# Patient Record
Sex: Female | Born: 1960 | Race: White | Hispanic: No | Smoking: Current every day smoker
Health system: Southern US, Community
[De-identification: ages and names within clinical notes are randomized; demographics above are authoritative.]

## PROBLEM LIST (undated history)

## (undated) DIAGNOSIS — R062 Wheezing: Secondary | ICD-10-CM

## (undated) DIAGNOSIS — R498 Other voice and resonance disorders: Secondary | ICD-10-CM

## (undated) DIAGNOSIS — I341 Nonrheumatic mitral (valve) prolapse: Secondary | ICD-10-CM

## (undated) DIAGNOSIS — R5381 Other malaise: Secondary | ICD-10-CM

## (undated) DIAGNOSIS — K5730 Diverticulosis of large intestine without perforation or abscess without bleeding: Secondary | ICD-10-CM

## (undated) DIAGNOSIS — Z8489 Family history of other specified conditions: Secondary | ICD-10-CM

## (undated) DIAGNOSIS — M549 Dorsalgia, unspecified: Secondary | ICD-10-CM

## (undated) DIAGNOSIS — R52 Pain, unspecified: Secondary | ICD-10-CM

## (undated) DIAGNOSIS — M1990 Unspecified osteoarthritis, unspecified site: Secondary | ICD-10-CM

## (undated) DIAGNOSIS — R0602 Shortness of breath: Secondary | ICD-10-CM

## (undated) DIAGNOSIS — R7302 Impaired glucose tolerance (oral): Secondary | ICD-10-CM

## (undated) DIAGNOSIS — R200 Anesthesia of skin: Secondary | ICD-10-CM

## (undated) DIAGNOSIS — J449 Chronic obstructive pulmonary disease, unspecified: Secondary | ICD-10-CM

## (undated) DIAGNOSIS — T883XXA Malignant hyperthermia due to anesthesia, initial encounter: Secondary | ICD-10-CM

## (undated) DIAGNOSIS — I4949 Other premature depolarization: Secondary | ICD-10-CM

## (undated) DIAGNOSIS — R1032 Left lower quadrant pain: Secondary | ICD-10-CM

## (undated) DIAGNOSIS — J029 Acute pharyngitis, unspecified: Secondary | ICD-10-CM

## (undated) DIAGNOSIS — C801 Malignant (primary) neoplasm, unspecified: Secondary | ICD-10-CM

## (undated) DIAGNOSIS — F329 Major depressive disorder, single episode, unspecified: Secondary | ICD-10-CM

## (undated) DIAGNOSIS — K589 Irritable bowel syndrome without diarrhea: Secondary | ICD-10-CM

## (undated) DIAGNOSIS — E8809 Other disorders of plasma-protein metabolism, not elsewhere classified: Secondary | ICD-10-CM

## (undated) DIAGNOSIS — R05 Cough: Secondary | ICD-10-CM

## (undated) DIAGNOSIS — I10 Essential (primary) hypertension: Secondary | ICD-10-CM

## (undated) DIAGNOSIS — R5383 Other fatigue: Secondary | ICD-10-CM

## (undated) DIAGNOSIS — K861 Other chronic pancreatitis: Principal | ICD-10-CM

## (undated) DIAGNOSIS — Z8719 Personal history of other diseases of the digestive system: Secondary | ICD-10-CM

## (undated) DIAGNOSIS — F411 Generalized anxiety disorder: Secondary | ICD-10-CM

## (undated) HISTORY — DX: Left lower quadrant pain: R10.32

## (undated) HISTORY — DX: Essential (primary) hypertension: I10

## (undated) HISTORY — DX: Shortness of breath: R06.02

## (undated) HISTORY — DX: Other chronic pancreatitis: K86.1

## (undated) HISTORY — PX: COLON RESECTION: SHX5231

## (undated) HISTORY — DX: Acute pharyngitis, unspecified: J02.9

## (undated) HISTORY — DX: Other premature depolarization: I49.49

## (undated) HISTORY — DX: Major depressive disorder, single episode, unspecified: F32.9

## (undated) HISTORY — DX: Impaired glucose tolerance (oral): R73.02

## (undated) HISTORY — DX: Dorsalgia, unspecified: M54.9

## (undated) HISTORY — DX: Other fatigue: R53.83

## (undated) HISTORY — DX: Personal history of other diseases of the digestive system: Z87.19

## (undated) HISTORY — DX: Other malaise: R53.81

## (undated) HISTORY — DX: Other disorders of plasma-protein metabolism, not elsewhere classified: E88.09

## (undated) HISTORY — DX: Irritable bowel syndrome without diarrhea: K58.9

## (undated) HISTORY — PX: COLONOSCOPY: SHX174

## (undated) HISTORY — DX: Diverticulosis of large intestine without perforation or abscess without bleeding: K57.30

## (undated) HISTORY — PX: UPPER GASTROINTESTINAL ENDOSCOPY: SHX188

## (undated) HISTORY — DX: Wheezing: R06.2

## (undated) HISTORY — DX: Other voice and resonance disorders: R49.8

## (undated) HISTORY — DX: Generalized anxiety disorder: F41.1

## (undated) HISTORY — DX: Cough: R05

---
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
## 1997-08-02 ENCOUNTER — Other Ambulatory Visit: Admission: RE | Admit: 1997-08-02 | Discharge: 1997-08-02 | Payer: Self-pay | Admitting: Gynecology

## 1997-08-17 ENCOUNTER — Ambulatory Visit (HOSPITAL_COMMUNITY): Admission: RE | Admit: 1997-08-17 | Discharge: 1997-08-17 | Payer: Self-pay | Admitting: Surgery

## 1997-10-20 ENCOUNTER — Other Ambulatory Visit: Admission: RE | Admit: 1997-10-20 | Discharge: 1997-10-20 | Payer: Self-pay | Admitting: Gynecology

## 1998-12-05 ENCOUNTER — Other Ambulatory Visit: Admission: RE | Admit: 1998-12-05 | Discharge: 1998-12-05 | Payer: Self-pay | Admitting: Gynecology

## 1999-11-17 ENCOUNTER — Ambulatory Visit (HOSPITAL_COMMUNITY): Admission: RE | Admit: 1999-11-17 | Discharge: 1999-11-17 | Payer: Self-pay | Admitting: Gynecology

## 1999-11-17 ENCOUNTER — Encounter: Payer: Self-pay | Admitting: Gynecology

## 1999-12-14 ENCOUNTER — Other Ambulatory Visit: Admission: RE | Admit: 1999-12-14 | Discharge: 1999-12-14 | Payer: Self-pay | Admitting: Gynecology

## 2000-12-24 ENCOUNTER — Other Ambulatory Visit: Admission: RE | Admit: 2000-12-24 | Discharge: 2000-12-24 | Payer: Self-pay | Admitting: Gynecology

## 2001-08-29 ENCOUNTER — Encounter (INDEPENDENT_AMBULATORY_CARE_PROVIDER_SITE_OTHER): Payer: Self-pay | Admitting: Specialist

## 2001-08-29 ENCOUNTER — Ambulatory Visit: Admission: RE | Admit: 2001-08-29 | Discharge: 2001-08-29 | Payer: Self-pay | Admitting: Internal Medicine

## 2002-04-08 ENCOUNTER — Encounter: Admission: RE | Admit: 2002-04-08 | Discharge: 2002-04-08 | Payer: Self-pay | Admitting: Internal Medicine

## 2002-04-08 ENCOUNTER — Encounter: Payer: Self-pay | Admitting: Internal Medicine

## 2002-04-29 ENCOUNTER — Encounter: Payer: Self-pay | Admitting: Internal Medicine

## 2002-06-12 ENCOUNTER — Encounter: Payer: Self-pay | Admitting: Internal Medicine

## 2002-06-12 LAB — HM COLONOSCOPY

## 2002-08-03 ENCOUNTER — Encounter: Payer: Self-pay | Admitting: Internal Medicine

## 2003-02-28 ENCOUNTER — Emergency Department (HOSPITAL_COMMUNITY): Admission: EM | Admit: 2003-02-28 | Discharge: 2003-02-28 | Payer: Self-pay | Admitting: Emergency Medicine

## 2004-01-25 ENCOUNTER — Ambulatory Visit (HOSPITAL_COMMUNITY): Admission: RE | Admit: 2004-01-25 | Discharge: 2004-01-25 | Payer: Self-pay | Admitting: Gynecology

## 2004-01-25 ENCOUNTER — Other Ambulatory Visit: Admission: RE | Admit: 2004-01-25 | Discharge: 2004-01-25 | Payer: Self-pay | Admitting: Gynecology

## 2005-04-06 ENCOUNTER — Ambulatory Visit: Payer: Self-pay | Admitting: Internal Medicine

## 2005-08-15 ENCOUNTER — Ambulatory Visit: Payer: Self-pay | Admitting: Internal Medicine

## 2006-09-17 ENCOUNTER — Ambulatory Visit: Payer: Self-pay | Admitting: Internal Medicine

## 2006-09-18 ENCOUNTER — Ambulatory Visit: Payer: Self-pay | Admitting: Internal Medicine

## 2006-09-21 DIAGNOSIS — I4949 Other premature depolarization: Secondary | ICD-10-CM | POA: Insufficient documentation

## 2006-09-21 DIAGNOSIS — Z8719 Personal history of other diseases of the digestive system: Secondary | ICD-10-CM

## 2006-09-21 DIAGNOSIS — R059 Cough, unspecified: Secondary | ICD-10-CM

## 2006-09-21 DIAGNOSIS — R05 Cough: Secondary | ICD-10-CM

## 2006-09-21 DIAGNOSIS — F411 Generalized anxiety disorder: Secondary | ICD-10-CM | POA: Insufficient documentation

## 2006-09-21 DIAGNOSIS — K589 Irritable bowel syndrome without diarrhea: Secondary | ICD-10-CM

## 2006-09-21 HISTORY — DX: Irritable bowel syndrome, unspecified: K58.9

## 2006-09-21 HISTORY — DX: Other premature depolarization: I49.49

## 2006-09-21 HISTORY — DX: Generalized anxiety disorder: F41.1

## 2006-09-21 HISTORY — DX: Cough, unspecified: R05.9

## 2006-09-21 HISTORY — DX: Personal history of other diseases of the digestive system: Z87.19

## 2007-04-07 LAB — CONVERTED CEMR LAB: Pap Smear: NORMAL

## 2007-06-24 ENCOUNTER — Other Ambulatory Visit: Admission: RE | Admit: 2007-06-24 | Discharge: 2007-06-24 | Payer: Self-pay | Admitting: Gynecology

## 2007-06-24 ENCOUNTER — Ambulatory Visit (HOSPITAL_COMMUNITY): Admission: RE | Admit: 2007-06-24 | Discharge: 2007-06-24 | Payer: Self-pay | Admitting: Gynecology

## 2007-12-22 ENCOUNTER — Telehealth: Payer: Self-pay | Admitting: Internal Medicine

## 2007-12-29 ENCOUNTER — Ambulatory Visit: Payer: Self-pay | Admitting: Internal Medicine

## 2007-12-29 DIAGNOSIS — F3289 Other specified depressive episodes: Secondary | ICD-10-CM

## 2007-12-29 DIAGNOSIS — F329 Major depressive disorder, single episode, unspecified: Secondary | ICD-10-CM | POA: Insufficient documentation

## 2007-12-29 DIAGNOSIS — K5730 Diverticulosis of large intestine without perforation or abscess without bleeding: Secondary | ICD-10-CM | POA: Insufficient documentation

## 2007-12-29 DIAGNOSIS — I10 Essential (primary) hypertension: Secondary | ICD-10-CM

## 2007-12-29 HISTORY — DX: Diverticulosis of large intestine without perforation or abscess without bleeding: K57.30

## 2007-12-29 HISTORY — DX: Other specified depressive episodes: F32.89

## 2007-12-29 HISTORY — DX: Essential (primary) hypertension: I10

## 2007-12-29 HISTORY — DX: Major depressive disorder, single episode, unspecified: F32.9

## 2008-03-02 ENCOUNTER — Ambulatory Visit (HOSPITAL_COMMUNITY): Admission: RE | Admit: 2008-03-02 | Discharge: 2008-03-02 | Payer: Self-pay | Admitting: Gynecology

## 2008-06-02 ENCOUNTER — Ambulatory Visit: Payer: Self-pay | Admitting: Internal Medicine

## 2008-06-02 ENCOUNTER — Encounter: Payer: Self-pay | Admitting: Internal Medicine

## 2008-06-02 DIAGNOSIS — R0602 Shortness of breath: Secondary | ICD-10-CM | POA: Insufficient documentation

## 2008-06-02 HISTORY — DX: Shortness of breath: R06.02

## 2008-06-02 LAB — CONVERTED CEMR LAB
AST: 24 units/L (ref 0–37)
Albumin: 4.6 g/dL (ref 3.5–5.2)
Alkaline Phosphatase: 54 units/L (ref 39–117)
Basophils Relative: 0 % (ref 0.0–3.0)
Calcium: 9.9 mg/dL (ref 8.4–10.5)
GFR calc non Af Amer: 63.06 mL/min (ref >60)
Hemoglobin, Urine: NEGATIVE
Hemoglobin: 14.3 g/dL (ref 12.0–15.0)
Lymphocytes Relative: 29.5 % (ref 12.0–46.0)
Monocytes Relative: 5.8 % (ref 3.0–12.0)
Neutro Abs: 5.6 10*3/uL (ref 1.4–7.7)
RBC: 4.5 M/uL (ref 3.87–5.11)
Sodium: 143 meq/L (ref 135–145)
Specific Gravity, Urine: 1.015 (ref 1.000–1.030)
Total Protein: 7.2 g/dL (ref 6.0–8.3)
Urine Glucose: NEGATIVE mg/dL
Urobilinogen, UA: 0.2 (ref 0.0–1.0)

## 2008-06-03 ENCOUNTER — Encounter: Payer: Self-pay | Admitting: Internal Medicine

## 2008-06-21 ENCOUNTER — Encounter: Payer: Self-pay | Admitting: Internal Medicine

## 2008-06-21 ENCOUNTER — Ambulatory Visit: Payer: Self-pay | Admitting: Internal Medicine

## 2008-06-30 ENCOUNTER — Telehealth (INDEPENDENT_AMBULATORY_CARE_PROVIDER_SITE_OTHER): Payer: Self-pay | Admitting: *Deleted

## 2008-07-14 ENCOUNTER — Ambulatory Visit: Payer: Self-pay | Admitting: Internal Medicine

## 2008-07-14 DIAGNOSIS — J029 Acute pharyngitis, unspecified: Secondary | ICD-10-CM

## 2008-07-14 DIAGNOSIS — R5383 Other fatigue: Secondary | ICD-10-CM

## 2008-07-14 DIAGNOSIS — R5381 Other malaise: Secondary | ICD-10-CM

## 2008-07-14 HISTORY — DX: Acute pharyngitis, unspecified: J02.9

## 2008-07-14 HISTORY — DX: Other fatigue: R53.83

## 2008-07-14 HISTORY — DX: Other malaise: R53.81

## 2008-07-15 ENCOUNTER — Encounter (INDEPENDENT_AMBULATORY_CARE_PROVIDER_SITE_OTHER): Payer: Self-pay | Admitting: *Deleted

## 2008-07-20 ENCOUNTER — Encounter: Payer: Self-pay | Admitting: Internal Medicine

## 2008-08-13 ENCOUNTER — Ambulatory Visit: Payer: Self-pay | Admitting: Internal Medicine

## 2008-08-13 DIAGNOSIS — R498 Other voice and resonance disorders: Secondary | ICD-10-CM

## 2008-08-13 HISTORY — DX: Other voice and resonance disorders: R49.8

## 2008-09-14 ENCOUNTER — Encounter: Payer: Self-pay | Admitting: Internal Medicine

## 2008-11-03 ENCOUNTER — Encounter: Payer: Self-pay | Admitting: Gynecology

## 2008-11-03 ENCOUNTER — Other Ambulatory Visit: Admission: RE | Admit: 2008-11-03 | Discharge: 2008-11-03 | Payer: Self-pay | Admitting: Gynecology

## 2008-11-03 ENCOUNTER — Ambulatory Visit: Payer: Self-pay | Admitting: Gynecology

## 2009-01-11 ENCOUNTER — Telehealth: Payer: Self-pay | Admitting: Internal Medicine

## 2009-01-12 ENCOUNTER — Ambulatory Visit: Payer: Self-pay | Admitting: Internal Medicine

## 2009-01-12 DIAGNOSIS — M549 Dorsalgia, unspecified: Secondary | ICD-10-CM

## 2009-01-12 HISTORY — DX: Dorsalgia, unspecified: M54.9

## 2009-05-18 ENCOUNTER — Ambulatory Visit: Payer: Self-pay | Admitting: Internal Medicine

## 2009-05-18 DIAGNOSIS — R062 Wheezing: Secondary | ICD-10-CM

## 2009-05-18 HISTORY — DX: Wheezing: R06.2

## 2009-07-12 ENCOUNTER — Ambulatory Visit (HOSPITAL_COMMUNITY): Admission: RE | Admit: 2009-07-12 | Discharge: 2009-07-12 | Payer: Self-pay | Admitting: Gynecology

## 2010-02-24 ENCOUNTER — Encounter: Payer: Self-pay | Admitting: Internal Medicine

## 2010-02-24 ENCOUNTER — Ambulatory Visit
Admission: RE | Admit: 2010-02-24 | Discharge: 2010-02-24 | Payer: Self-pay | Source: Home / Self Care | Attending: Internal Medicine | Admitting: Internal Medicine

## 2010-02-24 DIAGNOSIS — R1032 Left lower quadrant pain: Secondary | ICD-10-CM

## 2010-02-24 HISTORY — DX: Left lower quadrant pain: R10.32

## 2010-02-26 LAB — CONVERTED CEMR LAB: GC Probe Amp, Urine: NEGATIVE

## 2010-02-27 ENCOUNTER — Ambulatory Visit: Payer: Self-pay | Admitting: Cardiology

## 2010-02-28 ENCOUNTER — Telehealth: Payer: Self-pay | Admitting: Internal Medicine

## 2010-03-06 ENCOUNTER — Telehealth: Payer: Self-pay | Admitting: Internal Medicine

## 2010-03-07 NOTE — Assessment & Plan Note (Signed)
Summary: cough,cold/cd   Vital Signs:  Patient profile:   50 year old female Height:      65.5 inches Weight:      119.50 pounds BMI:     19.65 O2 Sat:      96 % on Room air Temp:     97 degrees F oral Pulse rate:   88 / minute BP sitting:   100 / 70  (left arm) Cuff size:   regular  Vitals Entered ByZella Ball Ewing (May 18, 2009 2:28 PM)  O2 Flow:  Room air CC: Congestion, cough/RE   Primary Care Provider:  Oliver Barre, MD  CC:  Congestion and cough/RE.  History of Present Illness: no insurance today, trying to come in "early" in her illness, does not have illness very often but still smoking,  and here to c/o 3 days onset mild URI symtpoms with sinus and nasal congestion, fever, mild ST, and then onset yest and today with increasing mild min prod cough assoc with wheezing , mild sob.  Pt denies CP,, wheezing, orthopnea, pnd, worsening LE edema, palps, dizziness or syncope .  Problems Prior to Update: 1)  Wheezing  (ICD-786.07) 2)  Bronchitis-acute  (ICD-466.0) 3)  Back Pain  (ICD-724.5) 4)  Globus ? Dysphagia  (ICD-300.11) 5)  Hoarseness, Chronic  (ICD-784.49) 6)  Fatigue  (ICD-780.79) 7)  Sore Throat  (ICD-462) 8)  Dyspnea  (ICD-786.05) 9)  Depression  (ICD-311) 10)  Diverticulosis, Colon  (ICD-562.10) 11)  Hypertension  (ICD-401.9) 12)  Cough, Chronic  (ICD-786.2) 13)  Hemorrhoids, Hx of  (ICD-V12.79) 14)  Irritable Bowel Syndrome  (ICD-564.1) 15)  Premature Ventricular Contractions  (ICD-427.69) 16)  Anxiety  (ICD-300.00)  Medications Prior to Update: 1)  Propranolol Hcl 10 Mg Tabs (Propranolol Hcl) .Marland Kitchen.. 1 By Mouth Daily 2)  Alprazolam 0.25 Mg Tabs (Alprazolam) .Marland Kitchen.. 1po Once Daily As Needed 3)  Dicyclomine Hcl 10 Mg  Caps (Dicyclomine Hcl) .Marland Kitchen.. 1 By Mouth Qac (May Take 2) As Needed To Prevent Stomach Cramps and Rumbling 4)  Multivitamins  Tabs (Multiple Vitamin) .Marland Kitchen.. 1 Tablet By Mouth Once Daily 5)  Zegerid 40-1100 Mg Caps (Omeprazole-Sodium Bicarbonate) ....  Take 1 Capsule By Mouth Two Times A Day 6)  Prednisone 10 Mg Tabs (Prednisone) .... 4po Qd For 3days, Then 3po Qd For 3days, Then 2po Qd For 3days, Then 1po Qd For 3 Days, Then Stop 7)  Tramadol Hcl 50 Mg Tabs (Tramadol Hcl) .Marland Kitchen.. 1 - 2 By Mouth Four Times Per Day As Needed Pain  Current Medications (verified): 1)  Propranolol Hcl 10 Mg Tabs (Propranolol Hcl) .Marland Kitchen.. 1 By Mouth Daily 2)  Alprazolam 0.25 Mg Tabs (Alprazolam) .Marland Kitchen.. 1po Once Daily As Needed 3)  Dicyclomine Hcl 10 Mg  Caps (Dicyclomine Hcl) .Marland Kitchen.. 1 By Mouth Qac (May Take 2) As Needed To Prevent Stomach Cramps and Rumbling 4)  Multivitamins  Tabs (Multiple Vitamin) .Marland Kitchen.. 1 Tablet By Mouth Once Daily 5)  Zegerid 40-1100 Mg Caps (Omeprazole-Sodium Bicarbonate) .... Take 1 Capsule By Mouth Two Times A Day 6)  Prednisone 10 Mg Tabs (Prednisone) .... 4po Qd For 3days, Then 3po Qd For 3days, Then 2po Qd For 3days, Then 1po Qd For 3 Days, Then Stop 7)  Tramadol Hcl 50 Mg Tabs (Tramadol Hcl) .Marland Kitchen.. 1 - 2 By Mouth Four Times Per Day As Needed Pain 8)  Cephalexin 500 Mg Caps (Cephalexin) .Marland Kitchen.. 1 By Mouth Three Times A Day 9)  Tessalon Perles 100 Mg Caps (Benzonatate) .Marland Kitchen.. 1 -  2 By Mouth Three Times A Day As Needed 10)  Fluconazole 150 Mg Tabs (Fluconazole) .Marland Kitchen.. 1po Q 3 Days As Needed  Allergies (verified): 1)  ! * Anesthesias 2)  ! * Succinylcholine  Past History:  Past Medical History: Last updated: 12/29/2007 Anxiety Mitral Valve Prolapse Hx Symmpatomatic PVCs IBS Hemorrhoids Chronic Cough/chronic bronchitis - ? COPD Hypertension Diverticulosis, colon hx of Tx for H pylori  hx of hemoptysis - neg bronch per Dr Sherene Sires Depression  Past Surgical History: Last updated: 06/02/2008 Fractured Jaw, required wiring-1980  Social History: Last updated: 08/13/2008 Current Smoker 1 PPD Married 2 children college educated independent Scientist, research (medical) Alcohol Use - yes  occasional  Illicit Drug Use - no Patient has been counseled to quit smoking  08/13/08  Risk Factors: Smoking Status: current (06/02/2008) Packs/Day: 1.0 (06/02/2008)  Review of Systems       all otherwise negative per pt -    Physical Exam  General:  alert and underweight appearing.   Head:  normocephalic and atraumatic.   Eyes:  vision grossly intact, pupils equal, and pupils round.   Ears:  bilat tm's mild erythema, sinus nontender Nose:  nasal dischargemucosal pallor and mucosal edema.   Mouth:  pharyngeal erythema and fair dentition.   Neck:  supple and no masses.   Lungs:  normal respiratory effort, R decreased breath sounds, R wheezes, L decreased breath sounds, and L wheezes.   Heart:  normal rate and regular rhythm.   Extremities:  no edema, no erythema    Impression & Recommendations:  Problem # 1:  BRONCHITIS-ACUTE (ICD-466.0)  Her updated medication list for this problem includes:    Cephalexin 500 Mg Caps (Cephalexin) .Marland Kitchen... 1 by mouth three times a day    Tessalon Perles 100 Mg Caps (Benzonatate) .Marland Kitchen... 1 - 2 by mouth three times a day as needed treat as above, f/u any worsening signs or symptoms   Problem # 2:  WHEEZING (ICD-786.07) likely copd related;  tx with pred pack  Problem # 3:  HYPERTENSION (ICD-401.9)  Her updated medication list for this problem includes:    Propranolol Hcl 10 Mg Tabs (Propranolol hcl) .Marland Kitchen... 1 by mouth daily  BP today: 100/70 Prior BP: 116/74 (01/12/2009)  Labs Reviewed: K+: 3.8 (06/02/2008) Creat: : 1.0 (06/02/2008)    stable overall by hx and exam, ok to continue meds/tx as is   Complete Medication List: 1)  Propranolol Hcl 10 Mg Tabs (Propranolol hcl) .Marland Kitchen.. 1 by mouth daily 2)  Alprazolam 0.25 Mg Tabs (Alprazolam) .Marland Kitchen.. 1po once daily as needed 3)  Dicyclomine Hcl 10 Mg Caps (Dicyclomine hcl) .Marland Kitchen.. 1 by mouth qac (may take 2) as needed to prevent stomach cramps and rumbling 4)  Multivitamins Tabs (Multiple vitamin) .Marland Kitchen.. 1 tablet by mouth once daily 5)  Zegerid 40-1100 Mg Caps (Omeprazole-sodium  bicarbonate) .... Take 1 capsule by mouth two times a day 6)  Prednisone 10 Mg Tabs (Prednisone) .... 4po qd for 3days, then 3po qd for 3days, then 2po qd for 3days, then 1po qd for 3 days, then stop 7)  Tramadol Hcl 50 Mg Tabs (Tramadol hcl) .Marland Kitchen.. 1 - 2 by mouth four times per day as needed pain 8)  Cephalexin 500 Mg Caps (Cephalexin) .Marland Kitchen.. 1 by mouth three times a day 9)  Tessalon Perles 100 Mg Caps (Benzonatate) .Marland Kitchen.. 1 - 2 by mouth three times a day as needed 10)  Fluconazole 150 Mg Tabs (Fluconazole) .Marland Kitchen.. 1po q 3 days as needed  Patient Instructions:  1)  Please take all new medications as prescribed 2)  Continue all previous medications as before this visit  3)  Please schedule a follow-up appointment as needed. 4)  please stop smoking Prescriptions: FLUCONAZOLE 150 MG TABS (FLUCONAZOLE) 1po q 3 days as needed  #2 x 1   Entered and Authorized by:   Corwin Levins MD   Signed by:   Corwin Levins MD on 05/18/2009   Method used:   Electronically to        [facility] (retail)       [address]       [place], [place]  [address]       Ph: [phone]       Fax: [phone]   RxID:   [id number] FLUCONAZOLE 150 MG TABS (FLUCONAZOLE) 1po q 3 days as needed  #2 x 1   Entered and Authorized by:   Corwin Levins MD   Signed by:   Corwin Levins MD on 05/18/2009   Method used:   Print then Give to Patient   RxID:   [id number] TESSALON PERLES 100 MG CAPS (BENZONATATE) 1 - 2 by mouth three times a day as needed  #50 x 1   Entered and Authorized by:   Corwin Levins MD   Signed by:   Corwin Levins MD on 05/18/2009   Method used:   Print then Give to Patient   RxID:   [id number] CEPHALEXIN 500 MG CAPS (CEPHALEXIN) 1 by mouth three times a day  #30 x 0   Entered and Authorized by:   Corwin Levins MD   Signed by:   Corwin Levins MD on 05/18/2009   Method used:   Print then Give to Patient   RxID:   [id number] PREDNISONE 10 MG TABS (PREDNISONE) 4po qd  for 3days, then 3po qd for 3days, then 2po qd for 3days, then 1po qd for 3 days, then stop  #30 x 0   Entered and Authorized by:   Corwin Levins MD   Signed by:   Corwin Levins MD on 05/18/2009   Method used:   Print then Give to Patient   RxID:   [id number]

## 2010-03-09 NOTE — Progress Notes (Signed)
Summary: ? about CT  Phone Note Call from Patient Call back at Home Phone [phone]   Caller: Patient Summary of Call: Pt called with concerns regarding recent CT abd. Pt is requesting MD advise on possible diet restrictions and is requesting stronger pain mediciation. Please advise Initial call taken by: Margaret Pyle, [name],  February 28, 2010 1:50 PM  Follow-up for Phone Call        no specific diet changes needed except in the future should avoid nuts and popcorn kernels - anything like this that cant be chewed completely and might get "stuck" in a diverticulosis in the colon  the vicodin 5/500 at 1-2 q 6 is a common tx for this pain; I was hoping she could cont with this if possible   Follow-up by: Corwin Levins MD,  February 28, 2010 2:00 PM  Additional Follow-up for Phone Call Additional follow up Details #1::        Pt advised and will schedule ROV if no improvement in sxs with 1 week.  Additional Follow-up by: Margaret Pyle, [name],  March 01, 2010 10:20 AM

## 2010-03-09 NOTE — Assessment & Plan Note (Signed)
Summary: abd pain/john/cd   Vital Signs:  Patient profile:   50 year old female Height:      65.5 inches (166.37 cm) Weight:      119 pounds (54.09 kg) BMI:     19.57 O2 Sat:      98 % on Room air Temp:     98.6 degrees F (37.00 degrees C) oral Pulse rate:   100 / minute BP sitting:   120 / 76  (left arm) Cuff size:   regular  Vitals Entered By: Orlan Leavens RMA (February 24, 2010 4:31 PM)  O2 Flow:  Room air CC: abdminal pain x's 2 days, Abdominal pain Is Patient Diabetic? No Pain Assessment Patient in pain? yes     Location: abdomen   Primary Care Provider:  Oliver Barre, MD  CC:  abdminal pain x's 2 days and Abdominal pain.  History of Present Illness:  Abdominal Pain      This is a 50 year old woman who presents with Abdominal pain.  The symptoms began 2 days ago.  On a scale of 1 to 10, the intensity is described as a 9.  acute onset, progressive intensity.  The patient reports nausea, constipation, and anorexia, but denies vomiting, diarrhea, melena, hematochezia, and hematemesis.  The location of the pain is left upper quadrant.  The pain is described as constant and sharp.  The patient denies the following symptoms: fever, dysuria, chest pain, dark urine, missed menstrual period, and vaginal bleeding.  The pain is worse with standing, movement, and jarring of the abdomen.  The pain is improved by nothing.  No history of same pain but very concerned about STD due to unprotected sex  exosure.  Current Medications (verified): 1)  Propranolol Hcl 10 Mg Tabs (Propranolol Hcl) .Marland Kitchen.. 1 By Mouth Daily 2)  Alprazolam 0.25 Mg Tabs (Alprazolam) .Marland Kitchen.. 1po Once Daily As Needed 3)  Dicyclomine Hcl 10 Mg  Caps (Dicyclomine Hcl) .Marland Kitchen.. 1 By Mouth Qac (May Take 2) As Needed To Prevent Stomach Cramps and Rumbling 4)  Multivitamins  Tabs (Multiple Vitamin) .Marland Kitchen.. 1 Tablet By Mouth Once Daily 5)  Zegerid 40-1100 Mg Caps (Omeprazole-Sodium Bicarbonate) .... Take 1 Capsule By Mouth Two Times A  Day 6)  Tramadol Hcl 50 Mg Tabs (Tramadol Hcl) .Marland Kitchen.. 1 - 2 By Mouth Four Times Per Day As Needed Pain 7)  Fluconazole 150 Mg Tabs (Fluconazole) .Marland Kitchen.. 1po Q 3 Days As Needed  Allergies (verified): 1)  ! * Anesthesias 2)  ! * Succinylcholine  Past History:  Past Medical History: Anxiety Mitral Valve Prolapse Hx Symmpatomatic PVCs IBS Hemorrhoids Chronic Cough/chronic bronchitis - ? COPD Hypertension Diverticulosis, colon  hx of Tx for H pylori  hx of hemoptysis - neg bronch per Dr Sherene Sires Depression  Review of Systems  The patient denies chest pain, syncope, headaches, severe indigestion/heartburn, hematuria, incontinence, genital sores, and enlarged lymph nodes.    Physical Exam  General:  alert and underweight appearing.  very uncomfortable due to abd pain, folded forward while seated holding lower abdomen. Eyes:  vision grossly intact; pupils equal, round and reactive to light.  conjunctiva and lids normal.    Lungs:  normal respiratory effort, no intercostal retractions or use of accessory muscles; normal breath sounds bilaterally - no crackles and no wheezes.    Heart:  normal rate, regular rhythm, no murmur, and no rub. BLE without edema. Abdomen:  tender over LLQ, but soft, normal bowel sounds, no distention; no masses and no appreciable  hepatomegaly or splenomegaly.  no R/G Genitalia:  defer Skin:  .sking    Impression & Recommendations:  Problem # 1:  ABDOMINAL PAIN, LEFT LOWER QUADRANT (ICD-789.04)  suspect diverticulitis - noted hx diverticulosis arrange CT scan to eval anatomy and start emperic abx - cipro + flagyl - symptoms tx as well for pain and nausea - erx done encouraged ED eval - pt declines but agress to consider if symptoms worse rather than better of if unable to tol oral med/fluids also STD screen at pt request Orders: T-Chlamydia  Probe, urine ([id number]) T-GC Probe, urine ([id number]) Misc. Referral (Misc. Ref) Prescription Created  Electronically [phone])  Discussed symptom control with the patient.   Complete Medication List: 1)  Propranolol Hcl 10 Mg Tabs (Propranolol hcl) .Marland Kitchen.. 1 by mouth daily 2)  Alprazolam 0.25 Mg Tabs (Alprazolam) .Marland Kitchen.. 1po once daily as needed 3)  Dicyclomine Hcl 10 Mg Caps (Dicyclomine hcl) .Marland Kitchen.. 1 by mouth qac (may take 2) as needed to prevent stomach cramps and rumbling 4)  Multivitamins Tabs (Multiple vitamin) .Marland Kitchen.. 1 tablet by mouth once daily 5)  Zegerid 40-1100 Mg Caps (Omeprazole-sodium bicarbonate) .... Take 1 capsule by mouth two times a day 6)  Tramadol Hcl 50 Mg Tabs (Tramadol hcl) .Marland Kitchen.. 1 - 2 by mouth four times per day as needed pain 7)  Fluconazole 150 Mg Tabs (Fluconazole) .Marland Kitchen.. 1po q 3 days as needed 8)  Cipro 500 Mg Tabs (Ciprofloxacin hcl) .Marland Kitchen.. 1 by mouth two times a day x 7 days 9)  Metronidazole 500 Mg Tabs (Metronidazole) .Marland Kitchen.. 1 by mouth three times a day x 7 days 10)  Promethazine Hcl 25 Mg Tabs (Promethazine hcl) .Marland Kitchen.. 1 by mouth every 4 hours as needed for nausea symptoms 11)  Vicodin 5-500 Mg Tabs (Hydrocodone-acetaminophen) .Marland Kitchen.. 1-2 by mouth every 6 hours as needed for pain  Patient Instructions: 1)  it was good to see you today. 2)  CT scan as soon as possible 3)  antibiotics and nausea + pain pills - your prescriptions have been submitted to your pharmacy [facility]. Please take as directed. Contact our office if you believe you're having problems with the medication(s).  4)  test(s) ordered today - your results will be posted on the phone tree for review in 48-72 hours from the time of test completion; call [phone] and enter your 9 digit MRN (listed above on this page, just below your name); if any changes need to be made or there are abnormal results, you will be contacted directly.  5)  if your symptoms continue to worsen (pain, fever, etc), or if you are unable take anything by mouth (pills, fluids, etc), you should go to the emergency room for further evaluation and  treatment.  Prescriptions: VICODIN 5-500 MG TABS (HYDROCODONE-ACETAMINOPHEN) 1-2 by mouth every 6 hours as needed for pain  #40 x 1   Entered and Authorized by:   Newt Lukes MD   Signed by:   Newt Lukes MD on 02/24/2010   Method used:   Printed then faxed to ...       [facility]  [address] [phone]* (retail)       [address]       [place], [place]  [id number]       Ph: [phone] or [phone]       Fax: [phone]   RxID:   [id number] PROMETHAZINE HCL 25 MG TABS (PROMETHAZINE HCL) 1 by mouth  every 4 hours as needed for nausea symptoms  #30 x 0   Entered and Authorized by:   Newt Lukes MD   Signed by:   Newt Lukes MD on 02/24/2010   Method used:   [facility]ronically to        [facility] [address] [phone]* (retail)       [address]       [place], [place]  [address]       Ph: [phone] or [phone]       Fax: [phone]   RxID:   [id number] METRONIDAZOLE 500 MG TABS (METRONIDAZOLE) 1 by mouth three times a day x 7 days  #21 x 0   Entered and Authorized by:   Newt Lukes MD   Signed by:   Newt Lukes MD on 02/24/2010   Method used:   [facility]        [facility]  [address] [phone]* (retail)       [address]       [place], [place]  [address]       Ph: [phone] or [phone]       Fax: [phone]   RxID:   [id number] CIPRO 500 MG TABS (CIPROFLOXACIN HCL) 1 by mouth two times a day x 7 days  #14 x 0   Entered and Authorized by:   Newt Lukes MD   Signed by:   Newt Lukes MD on 02/24/2010   Method used:   [facility]        [facility] [facility] [address] [phone]* (retail)       [address]       [place], [place]  [id number]       Ph: [phone] or [phone]       Fax: [phone]   RxID:   [id number]    Orders Added: 1)  T-Chlamydia  Probe, urine  [id number] 2)  T-GC Probe, urine [phone] 3)  Misc. Referral [Misc. Ref] 4)  Est. Patient Level IV [[address]] 5)  Prescription Created Electronically [phone]

## 2010-03-15 NOTE — Progress Notes (Signed)
Summary: ABX SE?  Phone Note Call from Patient Call back at [facility] Phone [phone]   Caller: Patient Summary of Call: Pt called stating that she is experiencing extreme dizziness since started ABX, pt is requesting MD advisement. Initial call taken by: Margaret Pyle, [name],  March 06, 2010 2:34 PM  Follow-up for Phone Call        very unlikely to be med related;  if it is that bad, such as severe dizziness with trying to stand up , she should go to ER for IVF's as she may be likely dehydrated Follow-up by: Corwin Levins MD,  March 06, 2010 2:55 PM  Additional Follow-up for Phone Call Additional follow up Details #1::        left mess to call office back....................Marland KitchenLamar Sprinkles, [name]  March 06, 2010 6:22 PM   Pt advised  Additional Follow-up by: Margaret Pyle, [name],  March 07, 2010 10:46 AM

## 2010-06-23 NOTE — Op Note (Signed)
Tracey Fields, HURSTON                          ACCOUNT NO.:  [id number]   MEDICAL RECORD NO.:  [id number]                   PATIENT TYPE:  AMB   LOCATION:  CARD                                 FACILITY:  [facility]   PHYSICIAN:  Casimiro Needle B. Sherene Sires, M.D. [facility]           DATE OF BIRTH:  1960/07/16   DATE OF PROCEDURE:  08/29/2001  DATE OF DISCHARGE:  08/29/2001                                 OPERATIVE REPORT   PROCEDURE:  Diagnostic bronchoscopy with lavage.   BRONCHOSCOPIST:  Charlaine Dalton. Sherene Sires, M.D. [facility]   HISTORY:  This is a 50 year old white female smoker with new onset of  hemoptysis.  She was referred thru the courtesy of Dr. Jonny Ruiz with a normal  chest x-ray, and I recommended bronchoscopy because of the persistence of  low-grade hemoptysis despite the relatively young age.  She has not yet been  able to stop smoking.   Since an evaluation was done in the office within the last two weeks, there  has been no change in her history or exam (dictated office notes for  records).   DESCRIPTION OF PROCEDURE:  The procedure was performed in the bronchoscopy  suite with continuous monitoring by surface ECG and oximetry.  The patient  maintained greater than 90% saturation throughout the procedure in sinus  rhythm.  She received a total of 15 mg IV Demerol and 7.5 mg of IV Versed  for added sedation for this patient.   The nasopharynx and oropharynx were anesthetized with an up-draft aerosol 1%  lidocaine nebulizer.  The right naris was additionally prepared with 2%  lidocaine jelly.   Using a standard flexible fiberoptic bronchoscope, the right naris was  easily cannulated with good visualization of the entire oropharynx and  larynx.  The cords moved normally, and there were no upper airway lesions or  sources of bleeding identified.   Using additional 1% lidocaine as needed, the entire tracheobronchial  tree  was explored bilaterally without any findings.  1. Trachea carina and all the  airways opened widely; they were subsegmental     bilaterally.  2. There were copious mucoid secretions present with diffuse mild     inflammation but no definite purulent secretions nor focal mucosal     abnormalities.  Specifically, there was no source of blood identified.   Samples were sent for cytology.  The secretions were not felt to be purulent  to warrant cultures.   IMPRESSION:  Low-grade hemoptysis probably on the basis of chronic  bronchitis from smoking.    RECOMMENDATIONS:  Follow the patient up in the office when lavage will be  available for cytology review and at that time try to reinforce the issue of  smoking cessation and offer her referral for psychologic counseling for this  purpose if necessary.  Charlaine Dalton. Sherene Sires, M.D. [facility]    MBW/MEDQ  D:  08/29/2001  T:  09/04/2001  Job:  [id number]   cc:   Corwin Levins, M.D. [facility]

## 2010-09-08 ENCOUNTER — Other Ambulatory Visit: Payer: Self-pay | Admitting: Gynecology

## 2010-09-08 DIAGNOSIS — Z1231 Encounter for screening mammogram for malignant neoplasm of breast: Secondary | ICD-10-CM

## 2010-09-13 ENCOUNTER — Ambulatory Visit (HOSPITAL_COMMUNITY)
Admission: RE | Admit: 2010-09-13 | Discharge: 2010-09-13 | Disposition: A | Discharge status: Home / Self Care | Payer: Self-pay | Source: Ambulatory Visit | Attending: Gynecology | Admitting: Gynecology

## 2010-09-13 DIAGNOSIS — Z1231 Encounter for screening mammogram for malignant neoplasm of breast: Secondary | ICD-10-CM

## 2010-10-18 ENCOUNTER — Telehealth: Payer: Self-pay | Admitting: Internal Medicine

## 2010-10-19 ENCOUNTER — Ambulatory Visit (INDEPENDENT_AMBULATORY_CARE_PROVIDER_SITE_OTHER): Payer: Self-pay | Admitting: Internal Medicine

## 2010-10-19 ENCOUNTER — Encounter: Payer: Self-pay | Admitting: Internal Medicine

## 2010-10-19 VITALS — BP 102/70 | HR 86 | Temp 98.8°F | Ht 65.0 in | Wt 132.2 lb

## 2010-10-19 DIAGNOSIS — R7309 Other abnormal glucose: Secondary | ICD-10-CM

## 2010-10-19 DIAGNOSIS — Z0000 Encounter for general adult medical examination without abnormal findings: Secondary | ICD-10-CM | POA: Insufficient documentation

## 2010-10-19 DIAGNOSIS — R109 Unspecified abdominal pain: Secondary | ICD-10-CM | POA: Insufficient documentation

## 2010-10-19 DIAGNOSIS — M549 Dorsalgia, unspecified: Secondary | ICD-10-CM

## 2010-10-19 DIAGNOSIS — F17200 Nicotine dependence, unspecified, uncomplicated: Secondary | ICD-10-CM

## 2010-10-19 DIAGNOSIS — R7302 Impaired glucose tolerance (oral): Secondary | ICD-10-CM

## 2010-10-19 HISTORY — DX: Impaired glucose tolerance (oral): R73.02

## 2010-10-19 MED ORDER — PREDNISONE 10 MG PO TABS: ORAL_TABLET | ORAL | Status: DC

## 2010-10-19 MED ORDER — METRONIDAZOLE 500 MG PO TABS: 500.0000 mg | ORAL_TABLET | Freq: Three times a day (TID) | ORAL | Status: AC

## 2010-10-19 MED ORDER — CIPROFLOXACIN HCL 500 MG PO TABS: 500.0000 mg | ORAL_TABLET | Freq: Two times a day (BID) | ORAL | Status: AC

## 2010-10-19 MED ORDER — CIPROFLOXACIN HCL 500 MG PO TABS: 500.0000 mg | ORAL_TABLET | Freq: Two times a day (BID) | ORAL | Status: DC

## 2010-10-19 MED ORDER — METRONIDAZOLE 500 MG PO TABS: 500.0000 mg | ORAL_TABLET | Freq: Three times a day (TID) | ORAL | Status: DC

## 2010-10-19 NOTE — Assessment & Plan Note (Signed)
Hx and exam c/w recurrent diverticulitis, mild to mod, for cipro/flagyl today, declines pain meds, GI referral, colonscopy, labs/cbc/UA/lipase or f/u CT;   to f/u any worsening symptoms or concerns

## 2010-10-19 NOTE — Patient Instructions (Signed)
Take all new medications as prescribed Continue all other medications as before  

## 2010-10-19 NOTE — Assessment & Plan Note (Signed)
Urged to quit 

## 2010-10-19 NOTE — Assessment & Plan Note (Signed)
Declines a1c today 

## 2010-10-19 NOTE — Telephone Encounter (Signed)
Patient believes she has diverticulitis.  She is asking that we prescribe antibiotics.  She says Dr Leone Payor treated her for it earlier in the year.  It was actually primary care that saw her.  She states she would like antibiotics called in since it is the same pain and not have an office visit.  Patient not seen here since January of 2010.  She will contact her primary care MD to see if they will treat over the phone.

## 2010-10-19 NOTE — Assessment & Plan Note (Signed)
C/w left sciatica , mild recurrent, gave predpack but she understands to not use for now while on the antibx

## 2010-10-19 NOTE — Progress Notes (Signed)
Subjective:    Patient ID: Tracey Fields, female    DOB: [birth year], 50 y.o.   MRN: [id number]  HPI  Here to c/o left side abd pain, fever, nausea, general weakness andmalaise overall mild to mod ill feeling similar to episode of left sided diverticulitis/abscess episode jan 2012;  Feeling some cold but no chills or rigors;  Pt denies chest pain, increased sob or doe, wheezing, orthopnea, PND, increased LE swelling, palpitations, dizziness or syncope.  Pt denies new neurological symptoms such as new headache, or facial or extremity weakness or numbness   Pt denies polydipsia, polyuria, though may 2010 glucose mild elev 125.  Pt without insurance, and simply wanted antibx without exam, but here at our insistence.   Denies urinary symptoms such as dysuria, frequency, urgency,or hematuria.  Denies other GI c/o's such as vomiting, blood, constipation/diarrhea.  No pain radiation to the back.  No recent wt loss.  Has not pursued GI eval after initial episode jan 2012 due to cost, no hx of colonoscopy.  Adamant about avoiding signficant cost today, does not want labs or CT; original episode cost > $6000. Past Medical History  Diagnosis Date  . Abdominal pain, left lower quadrant 02/24/2010  . ANXIETY 09/21/2006  . BACK PAIN 01/12/2009  . COUGH, CHRONIC 09/21/2006  . DEPRESSION 12/29/2007  . DIVERTICULOSIS, COLON 12/29/2007  . DYSPNEA 06/02/2008  . FATIGUE 07/14/2008  . HEMORRHOIDS, HX OF 09/21/2006  . HOARSENESS, CHRONIC 08/13/2008  . HYPERTENSION 12/29/2007  . Irritable bowel syndrome 09/21/2006  . 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 SORE THROAT 07/14/2008  . Wheezing 05/18/2009   Past Surgical History  Procedure Date  . Fractured jaw 1980    required wiring    reports that she has been smoking.  She does not have any smokeless tobacco history on file. She reports that she drinks alcohol. She reports that she does not use illicit drugs. family history includes Cancer in her father; Colon polyps  in her other; and Diabetes in her maternal grandfather. No Known Allergies No current outpatient prescriptions on file prior to visit.   Review of Systems Review of Systems  Constitutional: Negative for diaphoresis and unexpected weight change.  HENT: Negative for drooling and tinnitus.   Eyes: Negative for photophobia and visual disturbance.  Respiratory: Negative for choking and stridor.   Gastrointestinal: Negative for vomiting and blood in stool.  Genitourinary: Negative for hematuria and decreased urine volume.  Musculoskeletal: Negative for gait problem.  Skin: Negative for color change and wound.  Neurological: Negative for tremors and numbness.  Psychiatric/Behavioral: Negative for decreased concentration. The patient is not hyperactive.       Objective:   Physical Exam BP 102/70  Pulse 86  Temp(Src) 98.8 F (37.1 C) (Oral)  Ht 5\' 5"  (1.651 m)  Wt 132 lb 4 oz (59.988 kg)  BMI 22.01 kg/m2  SpO2 97%  LMP 09/25/2010 Physical Exam  VS noted, mild ill Constitutional: Pt appears well-developed and well-nourished.  HENT: Head: Normocephalic.  Right Ear: External ear normal.  Left Ear: External ear normal.  Eyes: Conjunctivae and EOM are normal. Pupils are equal, round, and reactive to light.  Neck: Normal range of motion. Neck supple.  Cardiovascular: Normal rate and regular rhythm.   Pulmonary/Chest: Effort normal and breath sounds normal.  Abd:  Soft, non-distended, + BS, but with moderate diffuse left sided tenderness without guarding or rebound, no HSM Neurological: Pt is alert. No cranial nerve deficit.  Skin: Skin is warm. No erythema.  Psychiatric: Pt behavior is normal. Thought content normal. 1+ nervous        Assessment & Plan:

## 2011-01-28 ENCOUNTER — Emergency Department (HOSPITAL_COMMUNITY)
Admission: EM | Admit: 2011-01-28 | Discharge: 2011-01-28 | Disposition: A | Discharge status: Home / Self Care | Payer: Self-pay | Attending: Emergency Medicine | Admitting: Emergency Medicine

## 2011-01-28 ENCOUNTER — Emergency Department (HOSPITAL_COMMUNITY): Payer: Self-pay

## 2011-01-28 ENCOUNTER — Encounter (HOSPITAL_COMMUNITY): Payer: Self-pay | Admitting: *Deleted

## 2011-01-28 DIAGNOSIS — M79609 Pain in unspecified limb: Secondary | ICD-10-CM | POA: Insufficient documentation

## 2011-01-28 DIAGNOSIS — F17200 Nicotine dependence, unspecified, uncomplicated: Secondary | ICD-10-CM | POA: Insufficient documentation

## 2011-01-28 DIAGNOSIS — B349 Viral infection, unspecified: Secondary | ICD-10-CM

## 2011-01-28 DIAGNOSIS — R05 Cough: Secondary | ICD-10-CM | POA: Insufficient documentation

## 2011-01-28 DIAGNOSIS — J069 Acute upper respiratory infection, unspecified: Secondary | ICD-10-CM

## 2011-01-28 DIAGNOSIS — R5381 Other malaise: Secondary | ICD-10-CM | POA: Insufficient documentation

## 2011-01-28 DIAGNOSIS — M5430 Sciatica, unspecified side: Secondary | ICD-10-CM

## 2011-01-28 DIAGNOSIS — R059 Cough, unspecified: Secondary | ICD-10-CM | POA: Insufficient documentation

## 2011-01-28 DIAGNOSIS — R6883 Chills (without fever): Secondary | ICD-10-CM | POA: Insufficient documentation

## 2011-01-28 DIAGNOSIS — R5383 Other fatigue: Secondary | ICD-10-CM | POA: Insufficient documentation

## 2011-01-28 DIAGNOSIS — Z79899 Other long term (current) drug therapy: Secondary | ICD-10-CM | POA: Insufficient documentation

## 2011-01-28 DIAGNOSIS — B9789 Other viral agents as the cause of diseases classified elsewhere: Secondary | ICD-10-CM | POA: Insufficient documentation

## 2011-01-28 MED ORDER — CYCLOBENZAPRINE HCL 10 MG PO TABS: 10.0000 mg | ORAL_TABLET | Freq: Three times a day (TID) | ORAL | Status: AC | PRN

## 2011-01-28 MED ORDER — HYDROCODONE-ACETAMINOPHEN 5-325 MG PO TABS
2.0000 | ORAL_TABLET | Freq: Once | ORAL | Status: AC
  Administered 2011-01-28: 2 via ORAL
  Filled 2011-01-28: qty 2

## 2011-01-28 MED ORDER — HYDROCODONE-ACETAMINOPHEN 5-325 MG PO TABS: 2.0000 | ORAL_TABLET | Freq: Four times a day (QID) | ORAL | Status: AC | PRN

## 2011-01-28 NOTE — ED Notes (Signed)
Multiple complaints. Reports having leg pain, was taking prednisone for it and now has cough and thinks she has pneumonia. Non-productive cough. Airway intact.

## 2011-01-28 NOTE — ED Provider Notes (Signed)
History     CSN: [id number]  Arrival date & time 01/28/11  1111   First MD Initiated Contact with Patient 01/28/11 1210      Chief Complaint  Patient presents with  . Cough  . Leg Pain    (Consider location/radiation/quality/duration/timing/severity/associated sxs/prior treatment) HPI Patient is a 50 yo F who presents with one week of cough and congestion as well as muscle aches.  She also complains of continued left lower back pain with radiation down the left leg.  She has been treated recently with steroids for her back pain.  This has worked during previous episodes but not this time.  She denies any GI or urinary symptoms.  There are no other associated or modifying factors.  Past Medical History  Diagnosis Date  . Abdominal pain, left lower quadrant 02/24/2010  . ANXIETY 09/21/2006  . BACK PAIN 01/12/2009  . COUGH, CHRONIC 09/21/2006  . DEPRESSION 12/29/2007  . DIVERTICULOSIS, COLON 12/29/2007  . DYSPNEA 06/02/2008  . FATIGUE 07/14/2008  . HEMORRHOIDS, HX OF 09/21/2006  . HOARSENESS, CHRONIC 08/13/2008  . HYPERTENSION 12/29/2007  . Irritable bowel syndrome 09/21/2006  . 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 SORE THROAT 07/14/2008  . Wheezing 05/18/2009  . Impaired glucose tolerance 10/19/2010    Past Surgical History  Procedure Date  . Fractured jaw 1980    required wiring    Family History  Problem Relation Age of Onset  . Cancer Father     lung and prostate cancer  . Diabetes Maternal Grandfather   . Colon polyps Other     History  Substance Use Topics  . Smoking status: Current Everyday Smoker  . Smokeless tobacco: Not on file   Comment: 1 ppd, patient has been counseled to quit smoking 08/13/08  . Alcohol Use: Yes     occasiona    OB History    Grav Para Term Preterm Abortions TAB SAB Ect Mult Living                  Review of Systems  Constitutional: Positive for chills and fatigue.  HENT: Positive for congestion.   Eyes: Negative.     Respiratory: Positive for cough.   Cardiovascular: Negative.   Gastrointestinal: Negative.   Genitourinary: Negative.   Musculoskeletal: Positive for back pain.  Skin: Negative.   Neurological: Negative.   Hematological: Negative.   Psychiatric/Behavioral: Negative.   All other systems reviewed and are negative.    Allergies  Anesthetics, amide  Home Medications   Current Outpatient Rx  Name Route Sig Dispense Refill  . ASPIRIN EC 81 MG PO TBEC Oral Take 81 mg by mouth daily.      . ADULT MULTIVITAMIN W/MINERALS CH Oral Take 1 tablet by mouth daily.      Marland Kitchen PRESCRIPTION MEDICATION Oral Take 1 tablet by mouth daily. Beta blocker 5 mg     . CYCLOBENZAPRINE HCL 10 MG PO TABS Oral Take 1 tablet (10 mg total) by mouth 3 (three) times daily as needed for muscle spasms. 20 tablet 0  . DICYCLOMINE HCL 10 MG PO CAPS Oral Take 10 mg by mouth 3 (three) times daily with meals as needed. To prevent stomach cramps or rumbling    . HYDROCODONE-ACETAMINOPHEN 5-325 MG PO TABS Oral Take 2 tablets by mouth every 6 (six) hours as needed for pain. 30 tablet 0    BP 138/92  Pulse 74  Temp(Src) 99.1 F (37.3 C) (Oral)  Resp 20  SpO2 91%  Physical  Exam  Nursing note and vitals reviewed. Constitutional: She is oriented to person, place, and time. She appears well-developed and well-nourished. No distress.  HENT:  Head: Normocephalic and atraumatic.  Eyes: Conjunctivae and EOM are normal. Pupils are equal, round, and reactive to light.  Neck: Normal range of motion.  Cardiovascular: Normal rate, regular rhythm, normal heart sounds and intact distal pulses.  Exam reveals no gallop and no friction rub.   No murmur heard. Pulmonary/Chest: Effort normal and breath sounds normal. No respiratory distress. She has no wheezes. She has no rales.  Abdominal: Soft. Bowel sounds are normal. She exhibits no distension. There is no tenderness. There is no rebound and no guarding.  Musculoskeletal: Normal  range of motion.       Patient has left lower paraspinal back pain  Neurological: She is alert and oriented to person, place, and time. No cranial nerve deficit. She exhibits normal muscle tone. Coordination normal.  Skin: Skin is warm and dry. No rash noted.  Psychiatric: She has a normal mood and affect.    ED Course  Procedures (including critical care time)  Labs Reviewed - No data to display Dg Chest 2 View  01/28/2011  *RADIOLOGY REPORT*  Clinical Data: Shortness of breath, cough, fever.  CHEST - 2 VIEW  Comparison: 06/02/2008  Findings: Heart and mediastinal contours are within normal limits. No focal opacities or effusions.  No acute bony abnormality.  Old right rib fracture noted, unchanged.  There is mild peribronchial thickening, stable.  IMPRESSION: Stable chronic bronchitic changes.  Original Report Authenticated By: Cyndie Chime, M.D.     1. Viral syndrome   2. Upper respiratory infection   3. Sciatica       MDM  Patient was evaluated and was given vicodin for her pain.  She had a chest x-ray given her symptoms.  I was negative.  Patient was discharged home with vicodin and flexeril.  Patient was told she could follow-up with her PCP.  She was discharged in good condition.        Cyndra Numbers, MD 01/29/11 [phone]

## 2011-02-16 ENCOUNTER — Encounter (HOSPITAL_COMMUNITY): Payer: Self-pay | Admitting: Emergency Medicine

## 2011-02-16 ENCOUNTER — Emergency Department (HOSPITAL_COMMUNITY)
Admission: EM | Admit: 2011-02-16 | Discharge: 2011-02-16 | Disposition: A | Discharge status: Home / Self Care | Payer: Self-pay | Attending: Emergency Medicine | Admitting: Emergency Medicine

## 2011-02-16 ENCOUNTER — Other Ambulatory Visit: Payer: Self-pay

## 2011-02-16 DIAGNOSIS — F1910 Other psychoactive substance abuse, uncomplicated: Secondary | ICD-10-CM | POA: Insufficient documentation

## 2011-02-16 DIAGNOSIS — I498 Other specified cardiac arrhythmias: Secondary | ICD-10-CM | POA: Insufficient documentation

## 2011-02-16 DIAGNOSIS — IMO0002 Reserved for concepts with insufficient information to code with codable children: Secondary | ICD-10-CM | POA: Insufficient documentation

## 2011-02-16 LAB — DIFFERENTIAL
Basophils Absolute: 0 10*3/uL (ref 0.0–0.1)
Basophils Relative: 0 % (ref 0–1)
Eosinophils Absolute: 0.1 10*3/uL (ref 0.0–0.7)
Eosinophils Relative: 1 % (ref 0–5)
Monocytes Absolute: 0.7 10*3/uL (ref 0.1–1.0)
Monocytes Relative: 9 % (ref 3–12)

## 2011-02-16 LAB — COMPREHENSIVE METABOLIC PANEL
ALT: 24 U/L (ref 0–35)
AST: 19 U/L (ref 0–37)
Alkaline Phosphatase: 75 U/L (ref 39–117)
CO2: 24 mEq/L (ref 19–32)
Calcium: 9.4 mg/dL (ref 8.4–10.5)
GFR calc Af Amer: >90 mL/min (ref >90)
Glucose, Bld: 119 mg/dL — ABNORMAL HIGH (ref 70–99)
Potassium: 3.3 mEq/L — ABNORMAL LOW (ref 3.5–5.1)
Sodium: 144 mEq/L (ref 135–145)
Total Protein: 7.8 g/dL (ref 6.0–8.3)

## 2011-02-16 LAB — CBC
HCT: 40.3 % (ref 36.0–46.0)
Hemoglobin: 13.9 g/dL (ref 12.0–15.0)
MCH: 30.4 pg (ref 26.0–34.0)
MCHC: 34.5 g/dL (ref 30.0–36.0)
MCV: 88.2 fL (ref 78.0–100.0)
RDW: 13.6 % (ref 11.5–15.5)

## 2011-02-16 LAB — ACETAMINOPHEN LEVEL: Acetaminophen (Tylenol), Serum: <15 ug/mL (ref 10–30)

## 2011-02-16 LAB — URINALYSIS, ROUTINE W REFLEX MICROSCOPIC
Bilirubin Urine: NEGATIVE
Hgb urine dipstick: NEGATIVE
Ketones, ur: NEGATIVE mg/dL
Nitrite: NEGATIVE
Protein, ur: NEGATIVE mg/dL
Specific Gravity, Urine: 1.009 (ref 1.005–1.030)
Urobilinogen, UA: 0.2 mg/dL (ref 0.0–1.0)

## 2011-02-16 LAB — RAPID URINE DRUG SCREEN, HOSP PERFORMED
Amphetamines: NOT DETECTED
Opiates: NOT DETECTED
Tetrahydrocannabinol: NOT DETECTED

## 2011-02-16 LAB — ETHANOL: Alcohol, Ethyl (B): 217 mg/dL — ABNORMAL HIGH (ref 0–11)

## 2011-02-16 MED ORDER — SODIUM CHLORIDE 0.9 % IV BOLUS (SEPSIS)
1000.0000 mL | Freq: Once | INTRAVENOUS | Status: AC
  Administered 2011-02-16: 1000 mL via INTRAVENOUS

## 2011-02-16 MED ORDER — IBUPROFEN 800 MG PO TABS
800.0000 mg | ORAL_TABLET | Freq: Once | ORAL | Status: AC
  Administered 2011-02-16: 800 mg via ORAL
  Filled 2011-02-16: qty 1

## 2011-02-16 NOTE — ED Notes (Signed)
Pt alert and oriented x4. Respirations even and unlabored, bilateral symmetrical rise and fall of chest. Skin warm and dry. In no acute distress. Denies needs. Pt resting with eyes closed. Sitter in room.

## 2011-02-16 NOTE — ED Notes (Signed)
Patient is resting comfortably. 

## 2011-02-16 NOTE — ED Notes (Signed)
Act team in pt room.

## 2011-02-16 NOTE — BH Assessment (Signed)
Assessment Note   Tracey Fields is an 51 y.o. female. Patient was bought to the ED after reported OD on Flexeril and unknown amt of ETOH. EMS reported patient advised that this was an SI attempt, however patient denied this. Patient is seen by psychiatrist for depression and reports no previous SI attempts or thoughts. Patient states she has approximately 1 beer per night, but last night had 3-5 which she acknowledges as being too much. Patient reports her psychiatrist increased her Xanax which has increased her depression, but she has an appointment with him at the end of the month.  CSW is confident that patient can be discharged. EDP notified who will speak with patient and determine if he agrees with recommendation or would like patient to see telepsych psychiatrist.    Axis I: Substance Induced Mood Disorder Axis II: Deferred Axis III:  Past Medical History  Diagnosis Date  . Abdominal pain, left lower quadrant 02/24/2010  . ANXIETY 09/21/2006  . BACK PAIN 01/12/2009  . COUGH, CHRONIC 09/21/2006  . DEPRESSION 12/29/2007  . DIVERTICULOSIS, COLON 12/29/2007  . DYSPNEA 06/02/2008  . FATIGUE 07/14/2008  . HEMORRHOIDS, HX OF 09/21/2006  . HOARSENESS, CHRONIC 08/13/2008  . HYPERTENSION 12/29/2007  . Irritable bowel syndrome 09/21/2006  . 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 SORE THROAT 07/14/2008  . Wheezing 05/18/2009  . Impaired glucose tolerance 10/19/2010   Axis IV: other psychosocial or environmental problems Axis V: 51-60 moderate symptoms  Past Medical History:  Past Medical History  Diagnosis Date  . Abdominal pain, left lower quadrant 02/24/2010  . ANXIETY 09/21/2006  . BACK PAIN 01/12/2009  . COUGH, CHRONIC 09/21/2006  . DEPRESSION 12/29/2007  . DIVERTICULOSIS, COLON 12/29/2007  . DYSPNEA 06/02/2008  . FATIGUE 07/14/2008  . HEMORRHOIDS, HX OF 09/21/2006  . HOARSENESS, CHRONIC 08/13/2008  . HYPERTENSION 12/29/2007  . Irritable bowel syndrome 09/21/2006  . PREMATURE VENTRICULAR  CONTRACTIONS 09/21/2006  . SORE THROAT 07/14/2008  . Wheezing 05/18/2009  . Impaired glucose tolerance 10/19/2010    Past Surgical History  Procedure Date  . Fractured jaw 1980    required wiring    Family History:  Family History  Problem Relation Age of Onset  . Cancer Father     lung and prostate cancer  . Diabetes Maternal Grandfather   . Colon polyps Other     Social History:  reports that she has been smoking.  She does not have any smokeless tobacco history on file. She reports that she drinks alcohol. She reports that she does not use illicit drugs.   Additional Social History: Alcohol / Drug Use  History of alcohol / drug use?: Yes  Substance #1  Name of Substance 1: ETOH 1 - Age of First Use: 19  1 - Amount (size/oz): 1 beer 1 - Frequency: daily  1 - Duration: Yeahs 1 - Last Use / Amount: 02/15/11 - Drank 3-5 beers  Additional Social History:    Allergies:  Allergies  Allergen Reactions  . Anesthetics, Amide Other (See Comments)    System shut down when 51 years old having jaw work done    Home Medications:  Medications Prior to Admission  Medication Dose Route Frequency Provider Last Rate Last Dose  . ibuprofen (ADVIL,MOTRIN) tablet 800 mg  800 mg Oral Once Hilario Quarry, MD   800 mg at 02/16/11 0544  . sodium chloride 0.9 % bolus 1,000 mL  1,000 mL Intravenous Once Carlisle Beers Molpus, MD   1,000 mL at 02/16/11 1021  Medications Prior to Admission  Medication Sig Dispense Refill  . aspirin EC 81 MG tablet Take 81 mg by mouth daily.        . Multiple Vitamin (MULITIVITAMIN WITH MINERALS) TABS Take 1 tablet by mouth daily.          OB/GYN Status:  No LMP recorded.  General Assessment Data Location of Assessment: [facility] ED ACT Assessment: Yes Living Arrangements: Alone Can pt return to current living arrangement?: Yes Admission Status: Involuntary Is patient capable of signing voluntary admission?: Yes Transfer from: Acute Hospital Referral Source:  Self/Family/Friend     Risk to self Suicidal Ideation: No Suicidal Intent: No Is patient at risk for suicide?: No Suicidal Plan?: No Access to Means: No What has been your use of drugs/alcohol within the last 12 months?: Alcohol Previous Attempts/Gestures: No How many times?:  (Denies) Intentional Self Injurious Behavior: None Family Suicide History: No Recent stressful life event(s): Other (Comment) ("Just life in general") Persecutory voices/beliefs?: No Depression: Yes Depression Symptoms: Isolating;Feeling worthless/self pity Substance abuse history and/or treatment for substance abuse?: No Suicide prevention information given to non-admitted patients: Not applicable  Risk to Others Homicidal Ideation: No Thoughts of Harm to Others: No Current Homicidal Intent: No Current Homicidal Plan: No Access to Homicidal Means: No History of harm to others?: No Assessment of Violence: None Noted Does patient have access to weapons?: No Criminal Charges Pending?: No Does patient have a court date: No  Psychosis Hallucinations: None noted Delusions: None noted  Mental Status Report Appear/Hygiene: Improved Eye Contact: Fair Motor Activity: Hyperactivity Speech: Rapid Level of Consciousness: Irritable;Alert Mood: Anxious;Irritable Affect: Blunted;Irritable Anxiety Level: None Thought Processes: Coherent;Relevant Judgement: Unimpaired Orientation: Person;Place;Time;Situation Obsessive Compulsive Thoughts/Behaviors: None  Cognitive Functioning Concentration: Normal Memory: Recent Intact;Remote Intact IQ: Average Insight: Fair Impulse Control: Poor Appetite: Good Sleep: No Change Vegetative Symptoms: None  Prior Inpatient Therapy Prior Inpatient Therapy: No  Prior Outpatient Therapy Prior Outpatient Therapy: Yes Prior Therapy Dates: Ongoing Prior Therapy Facilty/Provider(s): Dr. Olin Hauser Reason for Treatment: Depression                     Additional  Information 1:1 In Past 12 Months?: No CIRT Risk: No Elopement Risk: No Does patient have medical clearance?: Yes     Disposition:  Disposition Disposition of Patient: Outpatient treatment  On Site Evaluation by:   Reviewed with Physician:     Ileene Hutchinson 02/16/2011 11:22 AM

## 2011-02-16 NOTE — ED Provider Notes (Signed)
Patient here with ems report that she told them she was attempting to kill herself with od of flexeril and etoh.  They reported that there was a note that family took.  She now denies.  She has been committed by Dr. Read Drivers and will be medically cleared at 0900 for ACT to evaluate.  8:59 AM Patient states she took flexeril because leg was hurting.  Denies she was trying to hurt herself, but had been drinking alcohol.  States she had about five beers. Denies history of depression, suicidality, or mental health hospitalization.  PMD is Dr. Jonny Ruiz.  Flexeril prescribed through ed.  Patient states she has children age 46 and 13 are at home.  She denies depression or suicidality at this time.    Hilario Quarry, MD 02/16/11 [phone]

## 2011-02-16 NOTE — ED Notes (Signed)
Pt ex husband father came to visit pt, pt refused to see him

## 2011-02-16 NOTE — ED Notes (Signed)
ivc application faxed to [employer]

## 2011-02-16 NOTE — ED Notes (Signed)
Poison control called (stephanie).  Poison control states that if tylenol level was drawn within 1 hour of ingestion then tylenol level does not to be redrawn. Pt should be observed until pt is back to her baseline and has normal vitals.

## 2011-02-16 NOTE — ED Notes (Signed)
Motrin ordered and given for left sciatic nerve pain

## 2011-02-16 NOTE — ED Notes (Signed)
WUJ:[id number]<JY> Expected date:02/16/11<BR> Expected time: 2:20 AM<BR> Means of arrival:Ambulance<BR> Comments:<BR> Flexeril overdose, etoh

## 2011-02-16 NOTE — ED Notes (Signed)
Patient refused ivfs MD made aware

## 2011-02-16 NOTE — ED Notes (Signed)
Pt alert and oriented x4. Respirations even and unlabored, bilateral symmetrical rise and fall of chest. Skin warm and dry. In no acute distress. Denies needs. Pt is fidgety and reports "she cant sit still".

## 2011-02-16 NOTE — ED Provider Notes (Signed)
History     CSN: [id number]  Arrival date & time 02/16/11  0255   First MD Initiated Contact with Patient 02/16/11 0310      Chief Complaint  Patient presents with  . Drug Overdose    (Consider location/radiation/quality/duration/timing/severity/associated sxs/prior treatment) HPI Level 5 Caveat: Uncooperative. This is a 51 year old white female who took an unknown number of Flexeril tablets and an unknown amount of alcohol. She told EMS prior to arrival that she did this in a suicide attempt and that she wrote a suicide note which her family hid from the paramedics. To me she denies any of this and states she is ready to go home. EMS brought with them a Flexeril prescription with only a few tablets left, a Xanax prescription with only one half a tablet left, and a prescription for hydrocodone/acetaminophen  Past Medical History  Diagnosis Date  . Abdominal pain, left lower quadrant 02/24/2010  . ANXIETY 09/21/2006  . BACK PAIN 01/12/2009  . COUGH, CHRONIC 09/21/2006  . DEPRESSION 12/29/2007  . DIVERTICULOSIS, COLON 12/29/2007  . DYSPNEA 06/02/2008  . FATIGUE 07/14/2008  . HEMORRHOIDS, HX OF 09/21/2006  . HOARSENESS, CHRONIC 08/13/2008  . HYPERTENSION 12/29/2007  . Irritable bowel syndrome 09/21/2006  . 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 SORE THROAT 07/14/2008  . Wheezing 05/18/2009  . Impaired glucose tolerance 10/19/2010    Past Surgical History  Procedure Date  . Fractured jaw 1980    required wiring    Family History  Problem Relation Age of Onset  . Cancer Father     lung and prostate cancer  . Diabetes Maternal Grandfather   . Colon polyps Other     History  Substance Use Topics  . Smoking status: Current Everyday Smoker  . Smokeless tobacco: Not on file   Comment: 1 ppd, patient has been counseled to quit smoking 08/13/08  . Alcohol Use: Yes     occasiona    OB History    Grav Para Term Preterm Abortions TAB SAB Ect Mult Living                   Review of Systems  Unable to perform ROS   Allergies  Anesthetics, amide  Home Medications   Current Outpatient Rx  Name Route Sig Dispense Refill  . CYCLOBENZAPRINE HCL 10 MG PO TABS Oral Take 10 mg by mouth 3 (three) times daily as needed.    . ASPIRIN EC 81 MG PO TBEC Oral Take 81 mg by mouth daily.      Marland Kitchen DICYCLOMINE HCL 10 MG PO CAPS Oral Take 10 mg by mouth 3 (three) times daily with meals as needed. To prevent stomach cramps or rumbling    . ADULT MULTIVITAMIN W/MINERALS CH Oral Take 1 tablet by mouth daily.      Marland Kitchen PRESCRIPTION MEDICATION Oral Take 1 tablet by mouth daily. Beta blocker 5 mg       BP 109/69  Pulse 102  Temp(Src) 98.2 F (36.8 C) (Oral)  Resp 21  SpO2 96%  Physical Exam General: Well-developed, well-nourished female in no acute distress; appearance consistent with age of record HENT: normocephalic, atraumatic; breath smells of alcohol Eyes: pupils equal round and reactive to light; extraocular muscles intact Neck: supple Heart: regular rate and rhythm; tachycardia Lungs: Normal respiratory effort and excursion Abdomen: soft; nondistended Extremities: No deformity; full range of motion Neurologic: Awake, alert; motor function intact in all extremities and symmetric; no facial droop Skin: Warm and dry Psychiatric: Agitated;  uncooperative    ED Course  Procedures (including critical care time)     MDM  3:30 AM Involuntary commitment papers signed based on paramedic testimony.  EKG Interpretation:  Date & Time: 02/16/2011 3:24 AM  Rate: 106  Rhythm: sinus tachycardia  QRS Axis: normal  Intervals: normal  ST/T Wave abnormalities: normal  Conduction Disutrbances:none  Narrative Interpretation:   Old EKG Reviewed: none available  Nursing notes and vitals signs, including pulse oximetry, reviewed.  Summary of this visit's results, reviewed by myself:  Labs:  Results for orders placed during the hospital encounter of 02/16/11   SALICYLATE LEVEL      Component Value Range   Salicylate Lvl <2.0 (*) 2.8 - 20.0 (mg/dL)  ACETAMINOPHEN LEVEL      Component Value Range   Acetaminophen (Tylenol), Serum <15.0  10 - 30 (ug/mL)  URINE RAPID DRUG SCREEN (HOSP PERFORMED)      Component Value Range   Opiates NONE DETECTED  NONE DETECTED    Cocaine NONE DETECTED  NONE DETECTED    Benzodiazepines NONE DETECTED  NONE DETECTED    Amphetamines NONE DETECTED  NONE DETECTED    Tetrahydrocannabinol NONE DETECTED  NONE DETECTED    Barbiturates NONE DETECTED  NONE DETECTED   ETHANOL      Component Value Range   Alcohol, Ethyl (B) 217 (*) 0 - 11 (mg/dL)  COMPREHENSIVE METABOLIC PANEL      Component Value Range   Sodium 144  135 - 145 (mEq/L)   Potassium 3.3 (*) 3.5 - 5.1 (mEq/L)   Chloride 105  96 - 112 (mEq/L)   CO2 24  19 - 32 (mEq/L)   Glucose, Bld 119 (*) 70 - 99 (mg/dL)   BUN 11  6 - 23 (mg/dL)   Creatinine, Ser 0.45  0.50 - 1.10 (mg/dL)   Calcium 9.4  8.4 - 40.9 (mg/dL)   Total Protein 7.8  6.0 - 8.3 (g/dL)   Albumin 4.4  3.5 - 5.2 (g/dL)   AST 19  0 - 37 (U/L)   ALT 24  0 - 35 (U/L)   Alkaline Phosphatase 75  39 - 117 (U/L)   Total Bilirubin 0.2 (*) 0.3 - 1.2 (mg/dL)   GFR calc non Af Amer >90  >90 (mL/min)   GFR calc Af Amer >90  >90 (mL/min)  CBC      Component Value Range   WBC 7.8  4.0 - 10.5 (K/uL)   RBC 4.57  3.87 - 5.11 (MIL/uL)   Hemoglobin 13.9  12.0 - 15.0 (g/dL)   HCT 81.1  91.4 - 78.2 (%)   MCV 88.2  78.0 - 100.0 (fL)   MCH 30.4  26.0 - 34.0 (pg)   MCHC 34.5  30.0 - 36.0 (g/dL)   RDW 95.6  21.3 - 08.6 (%)   Platelets 328  150 - 400 (K/uL)  DIFFERENTIAL      Component Value Range   Neutrophils Relative 54  43 - 77 (%)   Neutro Abs 4.2  1.7 - 7.7 (K/uL)   Lymphocytes Relative 37  12 - 46 (%)   Lymphs Abs 2.9  0.7 - 4.0 (K/uL)   Monocytes Relative 9  3 - 12 (%)   Monocytes Absolute 0.7  0.1 - 1.0 (K/uL)   Eosinophils Relative 1  0 - 5 (%)   Eosinophils Absolute 0.1  0.0 - 0.7 (K/uL)    Basophils Relative 0  0 - 1 (%)   Basophils Absolute 0.0  0.0 -  0.1 (K/uL)  URINALYSIS, ROUTINE W REFLEX MICROSCOPIC      Component Value Range   Color, Urine YELLOW  YELLOW    APPearance CLEAR  CLEAR    Specific Gravity, Urine 1.009  1.005 - 1.030    pH 6.0  5.0 - 8.0    Glucose, UA NEGATIVE  NEGATIVE (mg/dL)   Hgb urine dipstick NEGATIVE  NEGATIVE    Bilirubin Urine NEGATIVE  NEGATIVE    Ketones, ur NEGATIVE  NEGATIVE (mg/dL)   Protein, ur NEGATIVE  NEGATIVE (mg/dL)   Urobilinogen, UA 0.2  0.0 - 1.0 (mg/dL)   Nitrite NEGATIVE  NEGATIVE    Leukocytes, UA NEGATIVE  NEGATIVE    7:46 AM Dr. Rosalia Hammers will reevaluate at [facility] and consult ACT.          Hanley Seamen, MD 02/16/11 [phone]

## 2011-02-16 NOTE — ED Notes (Signed)
Given gram crackers and soda per pt request

## 2011-02-16 NOTE — ED Notes (Signed)
Patient refused foley.  Urine spec obtained and sent. Charge nurse notified of suicide attempt and need for sitter

## 2011-02-16 NOTE — ED Notes (Signed)
Patient alert orientated x 3 now deining  That she has done anything other than she wants to sleep.  Dr Read Drivers sign petition for IVC.  Labs drawn.  Poisin control called for recommendations for treatment.  They recommend EKG/ monitor  And obs for 6 hrs.  Patty was the person at poison controll

## 2011-02-16 NOTE — ED Notes (Signed)
Vital signs stable. 

## 2011-02-16 NOTE — ED Provider Notes (Signed)
Assessment team has completed eval - they indicate pt having no thoughts of suicide or self harm, took muscle relaxer w etoh last pm but with no intent of suicide.   On recheck pt, awake and alert. Watching tv, eating and drinking. Normal mood and affect. Denies thoughts of harm to self currently or last pm, states was taking med for pain and drank too much but is not feeling either severely depressed or having thoughts of hurting self. States has pcp/therapist with whom she will follow up closely. Signs no harm contract. States is eager to return home.   Suzi Roots, MD 02/16/11 1245

## 2011-02-16 NOTE — ED Notes (Signed)
Per EMS patient took flexeril of unknown amount on top of ETOH.  Sts that her family hide her suicide note.  Patient is A/O x 3

## 2011-02-16 NOTE — ED Notes (Signed)
Patient denies pain and is resting comfortably.  

## 2011-02-16 NOTE — ED Notes (Signed)
Sitter unable to document on her work flow sheet. So documentation placed here.  Patient stated that she did not intend to kill herself. She said she got some pills from ed and just took them because her leg was hurting and wanted to go to sleep. She stated she also had alcohol with pills. The note was not a suicide not but just to let her family know what she took just in case she didn't wake up. Candace Gallus tech [phone]

## 2011-02-16 NOTE — ED Notes (Signed)
Dr Read Drivers notified that the patient refused ivf's and that she wanted to see him

## 2011-10-23 ENCOUNTER — Other Ambulatory Visit: Payer: Self-pay | Admitting: Gynecology

## 2011-10-23 DIAGNOSIS — Z1231 Encounter for screening mammogram for malignant neoplasm of breast: Secondary | ICD-10-CM

## 2011-11-08 ENCOUNTER — Ambulatory Visit (HOSPITAL_COMMUNITY): Payer: Self-pay

## 2011-11-20 ENCOUNTER — Ambulatory Visit (HOSPITAL_COMMUNITY): Payer: Self-pay | Attending: Gynecology

## 2011-11-21 ENCOUNTER — Other Ambulatory Visit: Payer: Self-pay | Admitting: Gynecology

## 2011-11-21 DIAGNOSIS — Z1231 Encounter for screening mammogram for malignant neoplasm of breast: Secondary | ICD-10-CM

## 2011-12-05 ENCOUNTER — Ambulatory Visit (HOSPITAL_COMMUNITY): Payer: Self-pay

## 2011-12-05 ENCOUNTER — Ambulatory Visit (HOSPITAL_COMMUNITY)
Admission: RE | Admit: 2011-12-05 | Discharge: 2011-12-05 | Disposition: A | Discharge status: Home / Self Care | Payer: Self-pay | Source: Ambulatory Visit | Attending: Gynecology | Admitting: Gynecology

## 2011-12-05 DIAGNOSIS — Z1231 Encounter for screening mammogram for malignant neoplasm of breast: Secondary | ICD-10-CM

## 2012-02-04 ENCOUNTER — Telehealth: Payer: Self-pay

## 2012-02-04 DIAGNOSIS — Z0000 Encounter for general adult medical examination without abnormal findings: Secondary | ICD-10-CM

## 2012-02-04 NOTE — Telephone Encounter (Signed)
Put lab order in. 

## 2012-03-27 ENCOUNTER — Other Ambulatory Visit (INDEPENDENT_AMBULATORY_CARE_PROVIDER_SITE_OTHER): Payer: Self-pay

## 2012-03-27 LAB — HEPATIC FUNCTION PANEL
AST: 25 U/L (ref 0–37)
Bilirubin, Direct: 0 mg/dL (ref 0.0–0.3)
Total Bilirubin: 0.5 mg/dL (ref 0.3–1.2)

## 2012-03-27 LAB — BASIC METABOLIC PANEL
BUN: 14 mg/dL (ref 6–23)
Creatinine, Ser: 0.8 mg/dL (ref 0.4–1.2)
GFR: 83.93 mL/min (ref >60.00)
Potassium: 3.8 mEq/L (ref 3.5–5.1)

## 2012-03-27 LAB — CBC WITH DIFFERENTIAL/PLATELET
Basophils Absolute: 0 10*3/uL (ref 0.0–0.1)
Basophils Relative: 0.4 % (ref 0.0–3.0)
Eosinophils Absolute: 0.1 10*3/uL (ref 0.0–0.7)
MCHC: 34.2 g/dL (ref 30.0–36.0)
MCV: 88.3 fl (ref 78.0–100.0)
Monocytes Absolute: 0.7 10*3/uL (ref 0.1–1.0)
Neutrophils Relative %: 62.5 % (ref 43.0–77.0)
RBC: 4.28 Mil/uL (ref 3.87–5.11)
RDW: 13.7 % (ref 11.5–14.6)

## 2012-03-27 LAB — URINALYSIS, ROUTINE W REFLEX MICROSCOPIC
Nitrite: NEGATIVE
Total Protein, Urine: NEGATIVE
Urine Glucose: NEGATIVE
pH: 5.5 (ref 5.0–8.0)

## 2012-03-27 LAB — TSH: TSH: 0.9 u[IU]/mL (ref 0.35–5.50)

## 2012-03-27 LAB — LIPID PANEL: Cholesterol: 175 mg/dL (ref 0–200)

## 2012-04-01 ENCOUNTER — Ambulatory Visit (INDEPENDENT_AMBULATORY_CARE_PROVIDER_SITE_OTHER)
Admission: RE | Admit: 2012-04-01 | Discharge: 2012-04-01 | Disposition: A | Discharge status: Home / Self Care | Payer: Self-pay | Source: Ambulatory Visit | Attending: Internal Medicine | Admitting: Internal Medicine

## 2012-04-01 ENCOUNTER — Ambulatory Visit (INDEPENDENT_AMBULATORY_CARE_PROVIDER_SITE_OTHER): Payer: Self-pay | Admitting: Internal Medicine

## 2012-04-01 ENCOUNTER — Encounter: Payer: Self-pay | Admitting: Internal Medicine

## 2012-04-01 VITALS — BP 130/84 | HR 84 | Temp 98.0°F | Ht 65.0 in | Wt 141.1 lb

## 2012-04-01 DIAGNOSIS — F17200 Nicotine dependence, unspecified, uncomplicated: Secondary | ICD-10-CM

## 2012-04-01 DIAGNOSIS — G4700 Insomnia, unspecified: Secondary | ICD-10-CM

## 2012-04-01 DIAGNOSIS — J449 Chronic obstructive pulmonary disease, unspecified: Secondary | ICD-10-CM | POA: Insufficient documentation

## 2012-04-01 DIAGNOSIS — Z0000 Encounter for general adult medical examination without abnormal findings: Secondary | ICD-10-CM

## 2012-04-01 DIAGNOSIS — R7309 Other abnormal glucose: Secondary | ICD-10-CM

## 2012-04-01 DIAGNOSIS — H9190 Unspecified hearing loss, unspecified ear: Secondary | ICD-10-CM

## 2012-04-01 DIAGNOSIS — I10 Essential (primary) hypertension: Secondary | ICD-10-CM

## 2012-04-01 DIAGNOSIS — R109 Unspecified abdominal pain: Secondary | ICD-10-CM

## 2012-04-01 DIAGNOSIS — H9192 Unspecified hearing loss, left ear: Secondary | ICD-10-CM

## 2012-04-01 DIAGNOSIS — J4489 Other specified chronic obstructive pulmonary disease: Secondary | ICD-10-CM

## 2012-04-01 MED ORDER — ZOLPIDEM TARTRATE 10 MG PO TABS: 10.0000 mg | ORAL_TABLET | Freq: Every evening | ORAL | Status: DC | PRN

## 2012-04-01 MED ORDER — DESVENLAFAXINE SUCCINATE ER 100 MG PO TB24: 100.0000 mg | ORAL_TABLET | Freq: Every day | ORAL | Status: DC

## 2012-04-01 MED ORDER — DESVENLAFAXINE SUCCINATE ER 50 MG PO TB24: 50.0000 mg | ORAL_TABLET | Freq: Every day | ORAL | Status: DC

## 2012-04-01 MED ORDER — ALBUTEROL SULFATE HFA 108 (90 BASE) MCG/ACT IN AERS: 2.0000 | INHALATION_SPRAY | Freq: Four times a day (QID) | RESPIRATORY_TRACT | Status: DC | PRN

## 2012-04-01 MED ORDER — RANITIDINE HCL 300 MG PO TABS: 300.0000 mg | ORAL_TABLET | Freq: Every day | ORAL | Status: DC

## 2012-04-01 MED ORDER — TIOTROPIUM BROMIDE MONOHYDRATE 18 MCG IN CAPS: 18.0000 ug | ORAL_CAPSULE | Freq: Every day | RESPIRATORY_TRACT | Status: DC

## 2012-04-01 MED ORDER — PROPRANOLOL HCL 10 MG PO TABS: 5.0000 mg | ORAL_TABLET | Freq: Every day | ORAL | Status: DC

## 2012-04-01 NOTE — Assessment & Plan Note (Signed)
Improved with irrigation 

## 2012-04-01 NOTE — Assessment & Plan Note (Signed)
For ambien prn,  to f/u any worsening symptoms or concerns  

## 2012-04-01 NOTE — Assessment & Plan Note (Signed)
Minor in past, asympt, follow

## 2012-04-01 NOTE — Assessment & Plan Note (Signed)
stable overall by history and exam, recent data reviewed with pt, and pt to continue medical treatment as before,  to f/u any worsening symptoms or concerns BP Readings from Last 3 Encounters:  04/01/12 130/84  02/16/11 123/75  01/28/11 138/92

## 2012-04-01 NOTE — Assessment & Plan Note (Signed)
Urged to quit 

## 2012-04-01 NOTE — Assessment & Plan Note (Signed)
prob gerd, IBS related but also with dysphagia, for zantac 300 qd, refer GI

## 2012-04-01 NOTE — Progress Notes (Signed)
Subjective:    Patient ID: Tracey Fields, female    DOB: [birth year], 52 y.o.   MRN: [id number]  HPI   Here for wellness and f/u;  Overall doing ok;  Pt denies CP, worsening, wheezing, orthopnea, PND, worsening LE edema, palpitations, dizziness or syncope, but does have ongoing sob/doe with hx of mild copd.   Pt denies neurological change such as new headache, facial or extremity weakness.  Pt denies polydipsia, polyuria, or low sugar symptoms. Pt states overall good compliance with treatment and medications, good tolerability, and has been trying to follow lower cholesterol diet.  Pt denies worsening depressive symptoms, suicidal ideation or panic. No fever, night sweats, wt loss, loss of appetite, or other constitutional symptoms.  Pt states good ability with ADL's, has low fall risk, home safety reviewed and adequate, no other significant changes in hearing or vision, and only occasionally active with exercise.  Has ongoing reflux and IBS like symptoms as well, also left ear hearing problem in the past wk - ? Wax.  Past Medical History  Diagnosis Date  . Abdominal pain, left lower quadrant 02/24/2010  . ANXIETY 09/21/2006  . BACK PAIN 01/12/2009  . COUGH, CHRONIC 09/21/2006  . DEPRESSION 12/29/2007  . DIVERTICULOSIS, COLON 12/29/2007  . DYSPNEA 06/02/2008  . FATIGUE 07/14/2008  . HEMORRHOIDS, HX OF 09/21/2006  . HOARSENESS, CHRONIC 08/13/2008  . HYPERTENSION 12/29/2007  . Irritable bowel syndrome 09/21/2006  . 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 SORE THROAT 07/14/2008  . Wheezing 05/18/2009  . Impaired glucose tolerance 10/19/2010   Past Surgical History  Procedure Laterality Date  . Fractured jaw  1980    required wiring    reports that she has been smoking.  She does not have any smokeless tobacco history on file. She reports that  drinks alcohol. She reports that she does not use illicit drugs. family history includes Cancer in her father; Colon polyps in her other; and Diabetes in  her maternal grandfather. Allergies  Allergen Reactions  . Anesthetics, Amide Other (See Comments)    System shut down when 52 years old having jaw work done   Current Outpatient Prescriptions on File Prior to Visit  Medication Sig Dispense Refill  . ALPRAZolam (XANAX) 0.5 MG tablet Take 0.5 mg by mouth 3 (three) times daily as needed. Anxiety      . aspirin EC 81 MG tablet Take 81 mg by mouth daily.        . Multiple Vitamin (MULITIVITAMIN WITH MINERALS) TABS Take 1 tablet by mouth daily.         No current facility-administered medications on file prior to visit.   Review of Systems Constitutional: Negative for diaphoresis, activity change, appetite change or unexpected weight change.  HENT: Negative for hearing loss, ear pain, facial swelling, mouth sores and neck stiffness.   Eyes: Negative for pain, redness and visual disturbance.  Respiratory: Negative for wheezing.   Cardiovascular: Negative for chest pain and palpitations.  Gastrointestinal: Negative for diarrhea, blood in stool, abdominal distention or other pain Genitourinary: Negative for hematuria, flank pain or change in urine volume.  Musculoskeletal: Negative for myalgias and joint swelling.  Skin: Negative for color change and wound.  Neurological: Negative for syncope and numbness. other than noted Hematological: Negative for adenopathy.  Psychiatric/Behavioral: Negative for hallucinations, self-injury, decreased concentration and agitation.      Objective:   Physical Exam BP 130/84  Pulse 84  Temp(Src) 98 F (36.7 C) (Oral)  Ht 5\' 5"  (1.651 m)  Wt 141 lb 2 oz (64.014 kg)  BMI 23.48 kg/m2  SpO2 96% VS noted,  Constitutional: Pt is oriented to person, place, and time. Appears well-developed and well-nourished.  Head: Normocephalic and atraumatic.  Right Ear: External ear normal.  Left Ear: External ear normal.  Nose: Nose normal.  Mouth/Throat: Oropharynx is clear and moist.  Eyes: Conjunctivae and EOM  are normal. Pupils are equal, round, and reactive to light.  Left ear canal wax impaction removed with irrigation Neck: Normal range of motion. Neck supple. No JVD present. No tracheal deviation present.  Cardiovascular: Normal rate, regular rhythm, normal heart sounds and intact distal pulses.   Pulmonary/Chest: Effort normal and breath sounds mild decreased.  Abdominal: Soft. Bowel sounds are normal. There is no tenderness. No HSM  Musculoskeletal: Normal range of motion. Exhibits no edema.  Lymphadenopathy:  Has no cervical adenopathy.  Neurological: Pt is alert and oriented to person, place, and time. Pt has normal reflexes. No cranial nerve deficit.  Skin: Skin is warm and dry. No rash noted.  Psychiatric:  Has  normal mood and affect. Behavior is normal.     Assessment & Plan:

## 2012-04-01 NOTE — Assessment & Plan Note (Signed)

## 2012-04-01 NOTE — Patient Instructions (Addendum)
Please stop smoking Please remember to followup with your GYN for the yearly pap smear and/or mammogram Please consider Healthcare.gov for insurance Please go to the XRAY Department in the Basement (go straight as you get off the elevator) for the x-ray testing You will be contacted by phone if any changes need to be made immediately.  Otherwise, you will receive a letter about your results with an explanation, but please check with MyChart first. Thank you for enrolling in MyChart. Please follow the instructions below to securely access your online medical record. MyChart allows you to send messages to your doctor, view your test results, renew your prescriptions, schedule appointments, and more. To Log into My Chart online, please go by [employer] or [employer] to [email or web address], or download the MyChart App from the [employer] of [employer] .  Your Username is: [email or web address] (password [email or web address]) Please send a practice Message on Mychart later today. Please take all new medication as prescribed - the spiriva, albuterol inhaler, and zantac Please continue all other medications as before, and refills have been done if requested. You will be contacted regarding the referral for: Gastroenterology - Dr Leone Payor Your left ear was irrigated today Please return in 6 months, or sooner if needed

## 2012-04-01 NOTE — Assessment & Plan Note (Signed)
Mild by PFT' 2010; for cxr today, start spiriva and albut inh prn

## 2012-05-16 ENCOUNTER — Other Ambulatory Visit: Payer: Self-pay

## 2012-05-16 MED ORDER — ZOLPIDEM TARTRATE 10 MG PO TABS: 10.0000 mg | ORAL_TABLET | Freq: Every evening | ORAL | Status: DC | PRN

## 2012-07-28 ENCOUNTER — Telehealth: Payer: Self-pay | Admitting: Internal Medicine

## 2012-07-28 NOTE — Telephone Encounter (Signed)
Patient Information:  Caller Name: Tracey Fields  Phone: [phone]  Patient: Tracey Fields, Tracey Fields  Gender: Female  DOB: 1960-02-08  Age: 52 Years  PCP: Oliver Barre (Adults only)  Pregnant: No  Office Follow Up:  Does the office need to follow up with this patient?: No  Instructions For The Office: N/A   Symptoms  Reason For Call & Symptoms: abdominal bloating and discomfort, has stretching sensation from bottom of breast area to umbilicus.  Sx always there for the last 4-5 months.  Finds herself eating less and bloating always worse in the evenings but never gone in the mornings although less.  Has been doing researsch on the internet and keeps seeing liver disease, stomach cancer and more so becoming concerned.  Reviewed Health History In EMR: Yes  Reviewed Medications In EMR: Yes  Reviewed Allergies In EMR: Yes  Reviewed Surgeries / Procedures: No  Date of Onset of Symptoms: Unknown OB / GYN:  LMP: 06/30/2012  Guideline(s) Used:  Abdominal Pain - Upper  Disposition Per Guideline:   See Within 2 Weeks in Office  Reason For Disposition Reached:   Abdominal pain is a chronic symptom (recurrent or ongoing AND lasting > 4 weeks)  Advice Given:  N/A  Patient Will Follow Care Advice:  YES  Appointment Scheduled:  07/29/2012 15:30:00 Appointment Scheduled Provider:  Oliver Barre (Adults only)

## 2012-07-28 NOTE — Telephone Encounter (Signed)
Patient reports that she is having distention for the last few months.  Her mother told her that she may have stomach cancer and she wants to be seen asap.  She denies abdominal pain, nausea/vomiting, blood in stool, changes in bowel habits, fever, hematemesis, or other complaints.  I have scheduled her to see Dr. Leone Payor for July 31.  She is aware that she may see her primary care MD until if she has additional concerns

## 2012-07-29 ENCOUNTER — Other Ambulatory Visit (INDEPENDENT_AMBULATORY_CARE_PROVIDER_SITE_OTHER): Payer: Self-pay

## 2012-07-29 ENCOUNTER — Encounter: Payer: Self-pay | Admitting: Internal Medicine

## 2012-07-29 ENCOUNTER — Ambulatory Visit (INDEPENDENT_AMBULATORY_CARE_PROVIDER_SITE_OTHER): Payer: Self-pay | Admitting: Internal Medicine

## 2012-07-29 VITALS — BP 140/90 | HR 81 | Temp 98.1°F | Ht 65.0 in | Wt 139.5 lb

## 2012-07-29 DIAGNOSIS — R109 Unspecified abdominal pain: Secondary | ICD-10-CM

## 2012-07-29 DIAGNOSIS — K861 Other chronic pancreatitis: Secondary | ICD-10-CM | POA: Insufficient documentation

## 2012-07-29 DIAGNOSIS — K439 Ventral hernia without obstruction or gangrene: Secondary | ICD-10-CM

## 2012-07-29 DIAGNOSIS — I10 Essential (primary) hypertension: Secondary | ICD-10-CM

## 2012-07-29 DIAGNOSIS — R1013 Epigastric pain: Secondary | ICD-10-CM

## 2012-07-29 HISTORY — DX: Other chronic pancreatitis: K86.1

## 2012-07-29 LAB — CBC WITH DIFFERENTIAL/PLATELET
Basophils Absolute: 0 10*3/uL (ref 0.0–0.1)
Basophils Relative: 0.7 % (ref 0.0–3.0)
Eosinophils Relative: 1.2 % (ref 0.0–5.0)
HCT: 41.9 % (ref 36.0–46.0)
Hemoglobin: 14.4 g/dL (ref 12.0–15.0)
Lymphocytes Relative: 34.8 % (ref 12.0–46.0)
Lymphs Abs: 2.5 10*3/uL (ref 0.7–4.0)
Monocytes Relative: 6.8 % (ref 3.0–12.0)
Neutro Abs: 4 10*3/uL (ref 1.4–7.7)
RBC: 4.62 Mil/uL (ref 3.87–5.11)
WBC: 7.1 10*3/uL (ref 4.5–10.5)

## 2012-07-29 LAB — BASIC METABOLIC PANEL
CO2: 24 mEq/L (ref 19–32)
Calcium: 9.6 mg/dL (ref 8.4–10.5)
Creatinine, Ser: 0.7 mg/dL (ref 0.4–1.2)
GFR: 95.12 mL/min (ref >60.00)
Sodium: 137 mEq/L (ref 135–145)

## 2012-07-29 LAB — HEPATIC FUNCTION PANEL
ALT: 31 U/L (ref 0–35)
AST: 25 U/L (ref 0–37)
Albumin: 4.6 g/dL (ref 3.5–5.2)
Alkaline Phosphatase: 72 U/L (ref 39–117)
Total Protein: 7.8 g/dL (ref 6.0–8.3)

## 2012-07-29 LAB — URINALYSIS, ROUTINE W REFLEX MICROSCOPIC
Hgb urine dipstick: NEGATIVE
Leukocytes, UA: NEGATIVE
Urine Glucose: NEGATIVE
Urobilinogen, UA: 0.2 (ref 0.0–1.0)

## 2012-07-29 LAB — LIPASE: Lipase: 32 U/L (ref 11.0–59.0)

## 2012-07-29 LAB — H. PYLORI ANTIBODY, IGG: H Pylori IgG: NEGATIVE

## 2012-07-29 MED ORDER — TRAMADOL HCL 50 MG PO TABS
50.0000 mg | ORAL_TABLET | Freq: Four times a day (QID) | ORAL | Status: DC | PRN
Start: 2012-07-29 — End: 2012-08-12

## 2012-07-29 MED ORDER — OMEPRAZOLE MAGNESIUM 20 MG PO TBEC: 20.0000 mg | DELAYED_RELEASE_TABLET | Freq: Every day | ORAL | Status: DC

## 2012-07-29 NOTE — Patient Instructions (Signed)
OK to stop the zantac 300 mg Please take the Prilosec 20 mg per day (OTC) - hardcopy  Please stop all NSAIDs such as BC powders and Ibuprofen Please take all new medication as prescribed - the tramadol as needed - hardcopy Please continue all other medications as before, and refills have been done if requested. Please see the [facility] before leaving for the CT scan scheduling for tomorrow Please go to the LAB in the Basement (turn left off the elevator) for the tests to be done today You will be contacted regarding the referral for: GI hopefully sooner You will be contacted by phone if any changes need to be made immediately.  Otherwise, you will receive a letter about your results with an explanation, but please check with MyChart first.

## 2012-07-29 NOTE — Assessment & Plan Note (Addendum)
On zantac, but has mod tender over epigastric and ventral hernia, suspect gastritis vs PUD due to nsaids but cant r/o incarceration, for bmet/labs, CT abd/pelvis,  to d/c BC powders, ibuprofen, for tramadol only for hand DJD pain, refer GI as liekly should try to be seen prior to her appt July 31 - ? Need EGD

## 2012-07-29 NOTE — Progress Notes (Signed)
Subjective:    Patient ID: Tracey Fields, female    DOB: May 13, 1960, 52 y.o.   MRN: [id number]  HPI  Here very concerned about 4 mo persistent GI symptoms of epigastric discomfort, nausea, no vomiting or other pain, no fever, wt loss, blood but with signficant bloating and distension, convinced she has a seroius illness, insistent on imaging, does not care about cost/no insurance.  Of note takes near daily [facility] powders and regular nsaid use for recurring back pain.   Past Medical History  Diagnosis Date  . Abdominal pain, left lower quadrant 02/24/2010  . ANXIETY 09/21/2006  . BACK PAIN 01/12/2009  . COUGH, CHRONIC 09/21/2006  . DEPRESSION 12/29/2007  . DIVERTICULOSIS, COLON 12/29/2007  . DYSPNEA 06/02/2008  . FATIGUE 07/14/2008  . HEMORRHOIDS, HX OF 09/21/2006  . HOARSENESS, CHRONIC 08/13/2008  . HYPERTENSION 12/29/2007  . Irritable bowel syndrome 09/21/2006  . 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 SORE THROAT 07/14/2008  . Wheezing 05/18/2009  . Impaired glucose tolerance 10/19/2010   Past Surgical History  Procedure Laterality Date  . Fractured jaw  1980    required wiring    reports that she has been smoking.  She does not have any smokeless tobacco history on file. She reports that  drinks alcohol. She reports that she does not use illicit drugs. family history includes Cancer in her father; Colon polyps in her other; and Diabetes in her maternal grandfather. Allergies  Allergen Reactions  . Anesthetics, Amide Other (See Comments)    System shut down when 52 years old having jaw work done   Current Outpatient Prescriptions on File Prior to Visit  Medication Sig Dispense Refill  . albuterol (PROVENTIL HFA;VENTOLIN HFA) 108 (90 BASE) MCG/ACT inhaler Inhale 2 puffs into the lungs every 6 (six) hours as needed for wheezing.  1 Inhaler  5  . ALPRAZolam (XANAX) 0.5 MG tablet Take 0.5 mg by mouth 3 (three) times daily as needed. Anxiety      . aspirin EC 81 MG tablet Take 81 mg by  mouth daily.        Marland Kitchen desvenlafaxine (PRISTIQ) 100 MG 24 hr tablet Take 1 tablet (100 mg total) by mouth daily.  90 tablet  3  . desvenlafaxine (PRISTIQ) 50 MG 24 hr tablet Take 1 tablet (50 mg total) by mouth daily.  90 tablet  3  . Multiple Vitamin (MULITIVITAMIN WITH MINERALS) TABS Take 1 tablet by mouth daily.        . propranolol (INDERAL) 10 MG tablet Take 0.5 tablets (5 mg total) by mouth daily.  45 tablet  3  . tiotropium (SPIRIVA HANDIHALER) 18 MCG inhalation capsule Place 1 capsule (18 mcg total) into inhaler and inhale daily.  30 capsule  12  . zolpidem (AMBIEN) 10 MG tablet Take 1 tablet (10 mg total) by mouth at bedtime as needed for sleep.  30 tablet  5   No current facility-administered medications on file prior to visit.   Review of Systems  Constitutional: Negative for unexpected weight change, or unusual diaphoresis  HENT: Negative for tinnitus.   Eyes: Negative for photophobia and visual disturbance.  Respiratory: Negative for choking and stridor.   Gastrointestinal: Negative for vomiting and blood in stool.  Genitourinary: Negative for hematuria and decreased urine volume.  Musculoskeletal: Negative for acute joint swelling Skin: Negative for color change and wound.  Neurological: Negative for tremors and numbness other than noted  Psychiatric/Behavioral: Negative for decreased concentration or  hyperactivity.  Objective:   Physical Exam BP 140/90  Pulse 81  Temp(Src) 98.1 F (36.7 C) (Oral)  Ht 5\' 5"  (1.651 m)  Wt 139 lb 8 oz (63.277 kg)  BMI 23.21 kg/m2  SpO2 92% VS noted,  Constitutional: Pt appears well-developed and well-nourished.  HENT: Head: NCAT.  Right Ear: External ear normal.  Left Ear: External ear normal.  Eyes: Conjunctivae and EOM are normal. Pupils are equal, round, and reactive to light.  Neck: Normal range of motion. Neck supple.  Cardiovascular: Normal rate and regular rhythm.   Pulmonary/Chest: Effort normal and breath sounds  normal.  Abd:  Soft, mild to mod epigastric tender, non-distended, + BS, does have mild ventral hernia Neurological: Pt is alert. Not confused  Skin: Skin is warm. No erythema.  Psychiatric: Pt behavior is normal. Thought content normal.     Assessment & Plan:

## 2012-07-30 ENCOUNTER — Ambulatory Visit (INDEPENDENT_AMBULATORY_CARE_PROVIDER_SITE_OTHER)
Admission: RE | Admit: 2012-07-30 | Discharge: 2012-07-30 | Disposition: A | Discharge status: Home / Self Care | Payer: Self-pay | Source: Ambulatory Visit | Attending: Internal Medicine | Admitting: Internal Medicine

## 2012-07-30 DIAGNOSIS — R1013 Epigastric pain: Secondary | ICD-10-CM

## 2012-07-30 DIAGNOSIS — K439 Ventral hernia without obstruction or gangrene: Secondary | ICD-10-CM

## 2012-07-30 MED ORDER — IOHEXOL 300 MG/ML  SOLN
100.0000 mL | Freq: Once | INTRAMUSCULAR | Status: AC | PRN
  Administered 2012-07-30: 100 mL via INTRAVENOUS

## 2012-07-30 NOTE — Assessment & Plan Note (Signed)
stable overall by history and exam, recent data reviewed with pt, and pt to continue medical treatment as before,  to f/u any worsening symptoms or concerns BP Readings from Last 3 Encounters:  07/29/12 140/90  04/01/12 130/84  02/16/11 123/75

## 2012-08-04 ENCOUNTER — Telehealth: Payer: Self-pay | Admitting: *Deleted

## 2012-08-04 NOTE — Telephone Encounter (Signed)
Pt called requesting results from CT from 6.25.2014.  Pt advised of Dr Melvyn Novas result note.

## 2012-08-05 ENCOUNTER — Other Ambulatory Visit (HOSPITAL_COMMUNITY)
Admission: RE | Admit: 2012-08-05 | Discharge: 2012-08-05 | Disposition: A | Discharge status: Home / Self Care | Payer: Self-pay | Source: Ambulatory Visit | Attending: Gynecology | Admitting: Gynecology

## 2012-08-05 ENCOUNTER — Ambulatory Visit (INDEPENDENT_AMBULATORY_CARE_PROVIDER_SITE_OTHER): Payer: Self-pay | Admitting: Gynecology

## 2012-08-05 ENCOUNTER — Encounter: Payer: Self-pay | Admitting: Gynecology

## 2012-08-05 VITALS — BP 138/88 | Ht 64.0 in | Wt 139.0 lb

## 2012-08-05 DIAGNOSIS — Z01419 Encounter for gynecological examination (general) (routine) without abnormal findings: Secondary | ICD-10-CM | POA: Insufficient documentation

## 2012-08-05 DIAGNOSIS — F17200 Nicotine dependence, unspecified, uncomplicated: Secondary | ICD-10-CM

## 2012-08-05 DIAGNOSIS — Z1159 Encounter for screening for other viral diseases: Secondary | ICD-10-CM

## 2012-08-05 DIAGNOSIS — R140 Abdominal distension (gaseous): Secondary | ICD-10-CM

## 2012-08-05 DIAGNOSIS — Z1151 Encounter for screening for human papillomavirus (HPV): Secondary | ICD-10-CM | POA: Insufficient documentation

## 2012-08-05 DIAGNOSIS — R141 Gas pain: Secondary | ICD-10-CM

## 2012-08-05 DIAGNOSIS — N951 Menopausal and female climacteric states: Secondary | ICD-10-CM

## 2012-08-05 NOTE — Progress Notes (Signed)
Tracey Fields [birth year] [id number]   History:    52 y.o.  for annual gyn exam Who has not been seen in the office since 2010. Patient has been complaining of several months of upper abdominal bloating. She was seen by her primary physician Dr. Oliver Barre who recently ordered an abdominal pelvic CT scan which was reported to be normal. He had done her lab work. Patient many years ago had a history of H. Pylori and was treated by Dr. Leone Payor her gastroenterologist for which she has an appointment to see him at the end of this month. Patient denied any hematochezia. She smokes one pack of cigarettes per day despite being counseled on numerous occasions. She is using withdrawal for contraception. Patient denies any prior history of abnormal Pap smears. She has been complaining of hot flashes irritability and mood swings and her cycles are coming every 4-5 months.she has not had a colonoscopy yet.  Past medical history,surgical history, family history and social history were all reviewed and documented in the EPIC chart.  Gynecologic History No LMP recorded. Patient is not currently having periods (Reason: Perimenopausal). Contraception: coitus interruptus Last Pap: 2010. Results were: normal Last mammogram: 2013. Results were: normal  Obstetric History OB History   Grav Para Term Preterm Abortions TAB SAB Ect Mult Living   2 2        2      # Outc Date GA Lbr Len/2nd Wgt Sex Del Anes PTL Lv   1 PAR            2 PAR                ROS: A ROS was performed and pertinent positives and negatives are included in the history.  GENERAL: No fevers or chills. HEENT: No change in vision, no earache, sore throat or sinus congestion. NECK: No pain or stiffness. CARDIOVASCULAR: No chest pain or pressure. No palpitations. PULMONARY: No shortness of breath, cough or wheeze. GASTROINTESTINAL:complaining of abdominal bloating regardless of meals GENITOURINARY: No urinary frequency, urgency, hesitancy or dysuria.  MUSCULOSKELETAL: No joint or muscle pain, no back pain, no recent trauma. DERMATOLOGIC: No rash, no itching, no lesions. ENDOCRINE: No polyuria, polydipsia, no heat or cold intolerance. No recent change in weight. HEMATOLOGICAL: No anemia or easy bruising or bleeding. NEUROLOGIC: No headache, seizures, numbness, tingling or weakness. PSYCHIATRIC: No depression, no loss of interest in normal activity or change in sleep pattern.     Exam: chaperone present  BP 138/88  Ht 5\' 4"  (1.626 m)  Wt 139 lb (63.05 kg)  BMI 23.85 kg/m2  Body mass index is 23.85 kg/(m^2).  General appearance : Well developed well nourished female. No acute distress HEENT: Neck supple, trachea midline, no carotid bruits, no thyroidmegaly Lungs: Clear to auscultation, no rhonchi or wheezes, or rib retractions  Heart: Regular rate and rhythm, no murmurs or gallops Breast:Examined in sitting and supine position were symmetrical in appearance, no palpable masses or tenderness,  no skin retraction, no nipple inversion, no nipple discharge, no skin discoloration, no axillary or supraclavicular lymphadenopathy Abdomen: no palpable masses or tenderness, no rebound or guarding Extremities: no edema or skin discoloration or tenderness  Pelvic:  Bartholin, Urethra, Skene Glands: Within normal limits             Vagina: No gross lesions or discharge  Cervix: No gross lesions or discharge  Uterus  anteverted, normal size, shape and consistency, non-tender and mobile  Adnexa  Without masses or tenderness  Anus and perineum  normal   Rectovaginal  normal sphincter tone without palpated masses or tenderness             Hemoccult obtained today results pending     Assessment/Plan:  52 y.o. female for annual exam with perimenopausal like symptoms consisting her hot flashes irritability and mood swings. We'll check her [facility] today. Stool Hemoccult testing done today results pending at this dictation. We will try to contact her  gastroenterologist to see if they can move up her appointment for earlier this month. Patient with past history of H. Pylori I am concerned that she may have a gastric ulcer. She does have history of GERD and takes H2 antagonists on a regular basis. Pap smear was done today. Literature and information on the menopause as well as on hormone replacement therapy as well as on smoking cessation was provided. We discussed importance of calcium and vitamin D for osteoporosis prevention along with regular exercise.    Ok Edwards MD, 1:41 PM 08/05/2012

## 2012-08-05 NOTE — Patient Instructions (Addendum)
Perimenopause Perimenopause is the time when your body begins to move into the menopause (no menstrual period for 12 straight months). It is a natural process. Perimenopause can begin 2 to 8 years before the menopause and usually lasts for one year after the menopause. During this time, your ovaries may or may not produce an egg. The ovaries vary in their production of estrogen and progesterone hormones each month. This can cause irregular menstrual periods, difficulty in getting pregnant, vaginal bleeding between periods and uncomfortable symptoms. CAUSES  Irregular production of the ovarian hormones, estrogen and progesterone, and not ovulating every month.  Other causes include:  Tumor of the pituitary gland in the brain. Medical disease that affects the ovaries.Hormone Therapy At menopause, your body begins making less estrogen and progesterone hormones. This causes the body to stop having menstrual periods. This is because estrogen and progesterone hormones control your periods and menstrual cycle. A lack of estrogen may cause symptoms such as:  Hot flushes (or hot flashes).  Vaginal dryness.  Dry skin.  Loss of sex drive.  Risk of bone loss (osteoporosis). When this happens, you may choose to take hormone therapy to get back the estrogen lost during menopause. When the hormone estrogen is given alone, it is usually referred to as ET (Estrogen Therapy). When the hormone progestin is combined with estrogen, it is generally called HT (Hormone Therapy). This was formerly known as hormone replacement therapy (HRT). Your caregiver can help you make a decision on what will be best for you. The decision to use HT seems to change often as new studies are done. Many studies do not agree on the benefits of hormone replacement therapy. LIKELY BENEFITS OF HT INCLUDE PROTECTION FROM:  Hot Flushes (also called hot flashes) - A hot flush is a sudden feeling of heat that spreads over the face and body.  The skin may redden like a blush. It is connected with sweats and sleep disturbance. Women going through menopause may have hot flushes a few times a month or several times per day depending on the woman.  Osteoporosis (bone loss)- Estrogen helps guard against bone loss. After menopause, a woman's bones slowly lose calcium and become weak and brittle. As a result, bones are more likely to break. The hip, wrist, and spine are affected most often. Hormone therapy can help slow bone loss after menopause. Weight bearing exercise and taking calcium with vitamin D also can help prevent bone loss. There are also medications that your caregiver can prescribe that can help prevent osteoporosis.  Vaginal Dryness - Loss of estrogen causes changes in the vagina. Its lining may become thin and dry. These changes can cause pain and bleeding during sexual intercourse. Dryness can also lead to infections. This can cause burning and itching. (Vaginal estrogen treatment can help relieve pain, itching, and dryness.)  Urinary Tract Infections are more common after menopause because of lack of estrogen. Some women also develop urinary incontinence because of low estrogen levels in the vagina and bladder.  Possible other benefits of estrogen include a positive effect on mood and short-term memory in women. RISKS AND COMPLICATIONS  Using estrogen alone without progesterone causes the lining of the uterus to grow. This increases the risk of lining of the uterus (endometrial) cancer. Your caregiver should give another hormone called progestin if you have a uterus.  Women who take combined (estrogen and progestin) HT appear to have an increased risk of breast cancer. The risk appears to be small, but increases  throughout the time that HT is taken.  Combined therapy also makes the breast tissue slightly denser which makes it harder to read mammograms (breast X-rays).  Combined, estrogen and progesterone therapy can be taken  together every day, in which case there may be spotting of blood. HT therapy can be taken cyclically in which case you will have menstrual periods. Cyclically means HT is taken for a set amount of days, then not taken, then this process is repeated.  HT may increase the risk of stroke, heart attack, breast cancer and forming blood clots in your leg.  Transdermal estrogen (estrogen that is absorbed through the skin with a patch or a cream) may have more positive results with:  Cholesterol.  Blood pressure.  Blood clots. Having the following conditions may indicate you should not have HT:  Endometrial cancer.  Liver disease.  Breast cancer.  Heart disease.  History of blood clots.  Stroke. TREATMENT   If you choose to take HT and have a uterus, usually estrogen and progestin are prescribed.  Your caregiver will help you decide the best way to take the medications.  Possible ways to take estrogen include:  Pills.  Patches.  Gels.  Sprays.  Vaginal estrogen cream, rings and tablets.  It is best to take the lowest dose possible that will help your symptoms and take them for the shortest period of time that you can.  Hormone therapy can help relieve some of the problems (symptoms) that affect women at menopause. Before making a decision about HT, talk to your caregiver about what is best for you. Be well informed and comfortable with your decisions. HOME CARE INSTRUCTIONS   Follow your caregivers advice when taking the medications.  A Pap test is done to screen for cervical cancer.  The first Pap test should be done at age 70.  Between ages 50 and 51, Pap tests are repeated every 2 years.  Beginning at age 89, you are advised to have a Pap test every 3 years as long as your past 3 Pap tests have been normal.  Some women have medical problems that increase the chance of getting cervical cancer. Talk to your caregiver about these problems. It is especially important  to talk to your caregiver if a new problem develops soon after your last Pap test. In these cases, your caregiver may recommend more frequent screening and Pap tests.  The above recommendations are the same for women who have or have not gotten the vaccine for HPV (Human Papillomavirus).  If you had a hysterectomy for a problem that was not a cancer or a condition that could lead to cancer, then you no longer need Pap tests. However, even if you no longer need a Pap test, a regular exam is a good idea to make sure no other problems are starting.   If you are between ages 45 and 40, and you have had normal Pap tests going back 10 years, you no longer need Pap tests. However, even if you no longer need a Pap test, a regular exam is a good idea to make sure no other problems are starting.   If you have had past treatment for cervical cancer or a condition that could lead to cancer, you need Pap tests and screening for cancer for at least 20 years after your treatment.  If Pap tests have been discontinued, risk factors (such as a new sexual partner) need to be re-assessed to determine if screening should be resumed.  Some women may need screenings more often if they are at high risk for cervical cancer.  Get mammograms done as per the advice of your caregiver. SEEK IMMEDIATE MEDICAL CARE IF:  You develop abnormal vaginal bleeding.  You have pain or swelling in your legs, shortness of breath, or chest pain.  You develop dizziness or headaches.  You have lumps or changes in your breasts or armpits.  You have slurred speech.  You develop weakness or numbness of your arms or legs.  You have pain, burning, or bleeding when urinating.  You develop abdominal pain. Document Released: 10/21/2002 Document Revised: 04/16/2011 Document Reviewed: 02/08/2010 [facility] Patient Information 2014 [place], [place].    Radiation treatment.  Chemotherapy.  Unknown causes.  Heavy smoking and  excessive alcohol intake can bring on perimenopause sooner. SYMPTOMS   Hot flashes.  Night sweats.  Irregular menstrual periods.  Decrease sex drive.  Vaginal dryness.  Headaches.  Mood swings.  Depression.  Memory problems.  Irritability.  Tiredness.  Weight gain.  Trouble getting pregnant.  The beginning of losing bone cells (osteoporosis).  The beginning of hardening of the arteries (atherosclerosis). DIAGNOSIS  Your caregiver will make a diagnosis by analyzing your age, menstrual history and your symptoms. They will do a physical exam noting any changes in your body, especially your female organs. Female hormone tests may or may not be helpful depending on the amount and when you produce the female hormones. However, other hormone tests may be helpful (ex. thyroid hormone) to rule out other problems. TREATMENT  The decision to treat during the perimenopause should be made by you and your caregiver depending on how the symptoms are affecting you and your life style. There are various treatments available such as:  Treating individual symptoms with a specific medication for that symptom (ex. tranquilizer for depression).  Herbal medications that can help specific symptoms.  Counseling.  Group therapy.  No treatment. HOME CARE INSTRUCTIONS   Before seeing your caregiver, make a list of your menstrual periods (when the occur, how heavy they are, how long between periods and how long they last), your symptoms and when they started.  Take the medication as recommended by your caregiver.  Sleep and rest.  Exercise.  Eat a diet that contains calcium (good for your bones) and soy (acts like estrogen hormone).  Do not smoke.  Avoid alcoholic beverages.  Taking vitamin E may help in certain cases.  Take calcium and vitamin D supplements to help prevent bone loss.  Group therapy is sometimes helpful.  Acupuncture may help in some cases. SEEK MEDICAL CARE  IF:   You have any of the above and want to know if it is perimenopause.  You want advice and treatment for any of your symptoms mentioned above.  You need a referral to a specialist (gynecologist, psychiatrist or psychologist). SEEK IMMEDIATE MEDICAL CARE IF:   You have vaginal bleeding.  Your period lasts longer than 8 days.  You periods are recurring sooner than 21 days.  You have bleeding after intercourse.  You have severe depression.  You have pain when you urinate.  You have severe headaches.  You develop vision problems. Document Released: 03/01/2004 Document Revised: 04/16/2011 Document Reviewed: 11/20/2007 [facility] Patient Information 2014 [place], [place]. Smoking Cessation Quitting smoking is important to your health and has many advantages. However, it is not always easy to quit since nicotine is a very addictive drug. Often times, people try 3 times or more before being able to quit.  This document explains the best ways for you to prepare to quit smoking. Quitting takes hard work and a lot of effort, but you can do it. ADVANTAGES OF QUITTING SMOKING  You will live longer, feel better, and live better.  Your body will feel the impact of quitting smoking almost immediately.  Within 20 minutes, blood pressure decreases. Your pulse returns to its normal level.  After 8 hours, carbon monoxide levels in the blood return to normal. Your oxygen level increases.  After 24 hours, the chance of having a heart attack starts to decrease. Your breath, hair, and body stop smelling like smoke.  After 48 hours, damaged nerve endings begin to recover. Your sense of taste and smell improve.  After 72 hours, the body is virtually free of nicotine. Your bronchial tubes relax and breathing becomes easier.  After 2 to 12 weeks, lungs can hold more air. Exercise becomes easier and circulation improves.  The risk of having a heart attack, stroke, cancer, or lung disease is greatly  reduced.  After 1 year, the risk of coronary heart disease is cut in half.  After 5 years, the risk of stroke falls to the same as a nonsmoker.  After 10 years, the risk of lung cancer is cut in half and the risk of other cancers decreases significantly.  After 15 years, the risk of coronary heart disease drops, usually to the level of a nonsmoker.  If you are pregnant, quitting smoking will improve your chances of having a healthy baby.  The people you live with, especially any children, will be healthier.  You will have extra money to spend on things other than cigarettes. QUESTIONS TO THINK ABOUT BEFORE ATTEMPTING TO QUIT You may want to talk about your answers with your caregiver.  Why do you want to quit?  If you tried to quit in the past, what helped and what did not?  What will be the most difficult situations for you after you quit? How will you plan to handle them?  Who can help you through the tough times? Your family? Friends? A caregiver?  What pleasures do you get from smoking? What ways can you still get pleasure if you quit? Here are some questions to ask your caregiver:  How can you help me to be successful at quitting?  What medicine do you think would be best for me and how should I take it?  What should I do if I need more help?  What is smoking withdrawal like? How can I get information on withdrawal? GET READY  Set a quit date.  Change your environment by getting rid of all cigarettes, ashtrays, matches, and lighters in your home, car, or work. Do not let people smoke in your home.  Review your past attempts to quit. Think about what worked and what did not. GET SUPPORT AND ENCOURAGEMENT You have a better chance of being successful if you have help. You can get support in many ways.  Tell your family, friends, and co-workers that you are going to quit and need their support. Ask them not to smoke around you.  Get individual, group, or telephone  counseling and support. Programs are available at [employer] and health centers. Call your local health department for information about programs in your area.  Spiritual beliefs and practices may help some smokers quit.  Download a "quit meter" on your computer to keep track of quit statistics, such as how long you have gone without smoking, cigarettes  not smoked, and money saved.  Get a self-help book about quitting smoking and staying off of tobacco. LEARN NEW SKILLS AND BEHAVIORS  Distract yourself from urges to smoke. Talk to someone, go for a walk, or occupy your time with a task.  Change your normal routine. Take a different route to work. Drink tea instead of coffee. Eat breakfast in a different place.  Reduce your stress. Take a hot bath, exercise, or read a book.  Plan something enjoyable to do every day. Reward yourself for not smoking.  Explore interactive web-based programs that specialize in helping you quit. GET MEDICINE AND USE IT CORRECTLY Medicines can help you stop smoking and decrease the urge to smoke. Combining medicine with the above behavioral methods and support can greatly increase your chances of successfully quitting smoking.  Nicotine replacement therapy helps deliver nicotine to your body without the negative effects and risks of smoking. Nicotine replacement therapy includes nicotine gum, lozenges, inhalers, nasal sprays, and skin patches. Some may be available over-the-counter and others require a prescription.  Antidepressant medicine helps people abstain from smoking, but how this works is unknown. This medicine is available by prescription.  Nicotinic receptor partial agonist medicine simulates the effect of nicotine in your brain. This medicine is available by prescription. Ask your caregiver for advice about which medicines to use and how to use them based on your health history. Your caregiver will tell you what side effects to look out for if you  choose to be on a medicine or therapy. Carefully read the information on the package. Do not use any other product containing nicotine while using a nicotine replacement product.  RELAPSE OR DIFFICULT SITUATIONS Most relapses occur within the first 3 months after quitting. Do not be discouraged if you start smoking again. Remember, most people try several times before finally quitting. You may have symptoms of withdrawal because your body is used to nicotine. You may crave cigarettes, be irritable, feel very hungry, cough often, get headaches, or have difficulty concentrating. The withdrawal symptoms are only temporary. They are strongest when you first quit, but they will go away within 10 14 days. To reduce the chances of relapse, try to:  Avoid drinking alcohol. Drinking lowers your chances of successfully quitting.  Reduce the amount of caffeine you consume. Once you quit smoking, the amount of caffeine in your body increases and can give you symptoms, such as a rapid heartbeat, sweating, and anxiety.  Avoid smokers because they can make you want to smoke.  Do not let weight gain distract you. Many smokers will gain weight when they quit, usually less than 10 pounds. Eat a healthy diet and stay active. You can always lose the weight gained after you quit.  Find ways to improve your mood other than smoking. FOR MORE INFORMATION  www.smokefree.gov  Document Released: 01/16/2001 Document Revised: 07/24/2011 Document Reviewed: 05/03/2011 [facility] Patient Information 2014 [place], [place].  Peptic Ulcer A peptic ulcer is a sore in the lining of in your esophagus (esophageal ulcer), stomach (gastric ulcer), or in the first part of your small intestine (duodenal ulcer). The ulcer causes erosion into the deeper tissue. CAUSES  Normally, the lining of the stomach and the small intestine protects itself from the acid that digests food. The protective lining can be damaged by:  An infection caused by  a bacterium called Helicobacter pylori (H. pylori).  Regular use of nonsteroidal anti-inflammatory drugs (NSAIDs), such as ibuprofen or aspirin.  Smoking tobacco. Other risk factors  include being older than 50, drinking alcohol excessively, and having a family history of ulcer disease.  SYMPTOMS   Burning pain or gnawing in the area between the chest and the belly button.  Heartburn.  Nausea and vomiting.  Bloating. The pain can be worse on an empty stomach and at night. If the ulcer results in bleeding, it can cause:  Black, tarry stools.  Vomiting of bright red blood.  Vomiting of coffee ground looking materials. DIAGNOSIS  A diagnosis is usually made based upon your history and an exam. Other tests and procedures may be performed to find the cause of the ulcer. Finding a cause will help determine the best treatment. Tests and procedures may include:  Blood tests, stool tests, or breath tests to check for the bacterium H. pylori.  An upper gastrointestinal (GI) series of the esophagus, stomach, and small intestine.  An endoscopy to examine the esophagus, stomach, and small intestine.  A biopsy. TREATMENT  Treatment may include:  Eliminating the cause of the ulcer, such as smoking, NSAIDs, or alcohol.  Medicines to reduce the amount of acid in your digestive tract.  Antibiotic medicines if the ulcer is caused by the H. pylori bacterium.  An upper endoscopy to treat a bleeding ulcer.  Surgery if the bleeding is severe or if the ulcer created a hole somewhere in the digestive system. HOME CARE INSTRUCTIONS   Avoid tobacco, alcohol, and caffeine. Smoking can increase the acid in the stomach, and continued smoking will impair the healing of ulcers.  Avoid foods and drinks that seem to cause discomfort or aggravate your ulcer.  Only take medicines as directed by your caregiver. Do not substitute over-the-counter medicines for prescription medicines without talking to  your caregiver.  Keep any follow-up appointments and tests as directed. SEEK MEDICAL CARE IF:   Your do not improve within 7 days of starting treatment.  You have ongoing indigestion or heartburn. SEEK IMMEDIATE MEDICAL CARE IF:   You have sudden, sharp, or persistent abdominal pain.  You have bloody or dark black, tarry stools.  You vomit blood or vomit that looks like coffee grounds.  You become light headed, weak, or feel faint.  You become sweaty or clammy. MAKE SURE YOU:   Understand these instructions.  Will watch your condition.  Will get help right away if you are not doing well or get worse. Document Released: 01/20/2000 Document Revised: 10/17/2011 Document Reviewed: 08/22/2011 [facility] Patient Information 2014 [place], [place].

## 2012-08-06 LAB — HEPATITIS C ANTIBODY: HCV Ab: NEGATIVE

## 2012-08-07 ENCOUNTER — Telehealth: Payer: Self-pay

## 2012-08-07 NOTE — Telephone Encounter (Signed)
Patient said you were going to check H. Pylori and I do not see that ordered. She said to ask you about it as you told her you were doing it.   Also, she said you did rectal exam and said you were going to do test for blood in her stool and she wanted to know about that as well.

## 2012-08-07 NOTE — Telephone Encounter (Signed)
Patient advised.

## 2012-08-07 NOTE — Telephone Encounter (Signed)
Message copied by Keenan Bachelor on Thu Aug 07, 2012 12:57 PM ------      Message from: Ok Edwards      Created: Wed Aug 06, 2012  8:39 AM       Please inform patient that her [facility] is in the menopausal range. If her hot flashes irritability mood swings and night sweats become worse and she wanted to discuss treatment options she will need to make an appointment. ------

## 2012-08-07 NOTE — Telephone Encounter (Signed)
I did not order and H. 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 She should address this with her gastroenterologist Dr. Leone Payor. I did send him a note and he is working on his schedule to try to move her up before the end of the month. I do not have the results back firm her stool testing. Her Pap smear was normal and her FSH was in the menopausal range

## 2012-08-12 ENCOUNTER — Ambulatory Visit (INDEPENDENT_AMBULATORY_CARE_PROVIDER_SITE_OTHER): Payer: Self-pay | Admitting: Internal Medicine

## 2012-08-12 ENCOUNTER — Encounter: Payer: Self-pay | Admitting: Internal Medicine

## 2012-08-12 ENCOUNTER — Other Ambulatory Visit (INDEPENDENT_AMBULATORY_CARE_PROVIDER_SITE_OTHER): Payer: Self-pay

## 2012-08-12 VITALS — BP 110/70 | HR 80 | Ht 64.57 in | Wt 142.0 lb

## 2012-08-12 DIAGNOSIS — R933 Abnormal findings on diagnostic imaging of other parts of digestive tract: Secondary | ICD-10-CM

## 2012-08-12 DIAGNOSIS — F1010 Alcohol abuse, uncomplicated: Secondary | ICD-10-CM

## 2012-08-12 DIAGNOSIS — R1013 Epigastric pain: Secondary | ICD-10-CM

## 2012-08-12 DIAGNOSIS — K861 Other chronic pancreatitis: Secondary | ICD-10-CM

## 2012-08-12 LAB — LIPASE: Lipase: 24 U/L (ref 11.0–59.0)

## 2012-08-12 LAB — AMYLASE: Amylase: 46 U/L (ref 27–131)

## 2012-08-12 MED ORDER — DEXLANSOPRAZOLE 60 MG PO CPDR: 60.0000 mg | DELAYED_RELEASE_CAPSULE | Freq: Every day | ORAL | Status: DC

## 2012-08-12 NOTE — Progress Notes (Signed)
Quick Note:  My chart message to patient ______

## 2012-08-12 NOTE — Patient Instructions (Addendum)
You have been scheduled for an endoscopy with propofol. Please follow written instructions given to you at your visit today. If you use inhalers (even only as needed), please bring them with you on the day of your procedure. Your physician has requested that you go to www.startemmi.com and enter the access code given to you at your visit today. This web site gives a general overview about your procedure. However, you should still follow specific instructions given to you by our office regarding your preparation for the procedure.  Your physician has requested that you go to the basement for the following lab work before leaving today: Amylase, Lipase  Stop alcohol consumption as much as possible.  Today we are giving you Dexilant samples to take one 30 minutes before breakfast daily.  This is to replace your Prilosec.   I appreciate the opportunity to care for you.

## 2012-08-12 NOTE — Progress Notes (Signed)
Subjective:    Patient ID: Tracey Fields, female    DOB: 1960/02/11, 52 y.o.   MRN: [id number]  HPI The patient is here at the request of Dr. Jonny Ruiz due to epigastric pain and bloating. She also has had nausea without vomiting. Problems occur every day and pain worse after eating. She has gained some weight. Wt Readings from Last 3 Encounters:  08/12/12 142 lb (64.411 kg)  08/05/12 139 lb (63.05 kg)  07/29/12 139 lb 8 oz (63.277 kg)  No dysphagia. Bowel movements are regular. Recent CT showed calcifications in head of pancreas. The gastric wall was thickened also but collapsed.She drinks 12 beers most days of the week and 4-5 beers on weekend days. "I am not an alcoholic". She is concerned about what might be causing her problems. She thinks she has had treatment for H. Pylori in past but had negative CLO test at [facility] bx 2004 and negative serology recently.  Allergies  Allergen Reactions  . Anesthetics, Amide Other (See Comments)    System shut down when 52 years old having jaw work done   Outpatient Prescriptions Prior to Visit  Medication Sig Dispense Refill  . ALPRAZolam (XANAX) 0.5 MG tablet Take 0.5 mg by mouth 3 (three) times daily as needed. Anxiety      . aspirin EC 81 MG tablet Take 81 mg by mouth daily.        Marland Kitchen desvenlafaxine (PRISTIQ) 100 MG 24 hr tablet Take 1 tablet (100 mg total) by mouth daily.  90 tablet  3  . desvenlafaxine (PRISTIQ) 50 MG 24 hr tablet Take 1 tablet (50 mg total) by mouth daily.  90 tablet  3  . propranolol (INDERAL) 10 MG tablet Take 0.5 tablets (5 mg total) by mouth daily.  45 tablet  3  . zolpidem (AMBIEN) 10 MG tablet Take 1 tablet (10 mg total) by mouth at bedtime as needed for sleep.  30 tablet  5  . Multiple Vitamin (MULITIVITAMIN WITH MINERALS) TABS Take 1 tablet by mouth daily.        Marland Kitchen omeprazole (PRILOSEC OTC) 20 MG tablet Take 1 tablet (20 mg total) by mouth daily.  90 tablet  3  . albuterol (PROVENTIL HFA;VENTOLIN HFA) 108 (90 BASE) MCG/ACT  inhaler Inhale 2 puffs into the lungs every 6 (six) hours as needed for wheezing.  1 Inhaler  5  . tiotropium (SPIRIVA HANDIHALER) 18 MCG inhalation capsule Place 1 capsule (18 mcg total) into inhaler and inhale daily.  30 capsule  12  . traMADol (ULTRAM) 50 MG tablet Take 1 tablet (50 mg total) by mouth every 6 (six) hours as needed for pain.  60 tablet  1   No facility-administered medications prior to visit.   Past Medical History  Diagnosis Date  . Abdominal pain, left lower quadrant 02/24/2010  . ANXIETY 09/21/2006  . BACK PAIN 01/12/2009  . COUGH, CHRONIC 09/21/2006  . DEPRESSION 12/29/2007  . DIVERTICULOSIS, COLON 12/29/2007  . DYSPNEA 06/02/2008  . FATIGUE 07/14/2008  . HEMORRHOIDS, HX OF 09/21/2006  . HOARSENESS, CHRONIC 08/13/2008  . HYPERTENSION 12/29/2007  . Irritable bowel syndrome 09/21/2006  . 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 SORE THROAT 07/14/2008  . Wheezing 05/18/2009  . Impaired glucose tolerance 10/19/2010   Past Surgical History  Procedure Laterality Date  . Fractured jaw  1980    required wiring  . Colonoscopy    EGD History   Social History  . Marital Status: Divorced    Spouse Name: N/A  Number of Children: 2  . Years of Education: N/A   Occupational History  . (UNTIL 3PM)   . independent Surveyor, [employer]    Social History Main Topics  . Smoking status: Current Every Day Smoker -- 1.00 packs/day    Types: Cigarettes  . Smokeless tobacco: Never Used     Comment: 1 ppd, patient has been counseled to quit smoking 08/13/08  . Alcohol Use: Yes     Comment: occasiona  . Drug Use: No  . Sexually Active: Yes   Other Topics Concern  . None   Social History Narrative  . None   Family History  Problem Relation Age of Onset  . Cancer Father     lung and prostate cancer  . Diabetes Maternal Grandfather   . Colon polyps Other   . Hypertension Mother    Review of Systems +anxiet, joint pain, back pain, fatigue, itching, muscle pains, all other  ROS negative or as per HPI    Objective:   Physical Exam General:  Well-developed, well-nourished and in no acute distress Eyes:  anicteric. ENT:   Mouth and posterior pharynx free of lesions.  Neck:   supple w/o thyromegaly or mass.  Lungs: Clear to auscultation bilaterally. Heart:  S1S2, no rubs, murmurs, gallops. Abdomen:  soft,  Mildly tender epigastrium, no hepatosplenomegaly, + diastasis recti, or mass and BS+.  Lymph:  no cervical or supraclavicular adenopathy. Extremities:   no edema Skin   no rash. Neuro:  A&O x 3.  Psych:  appropriate mood and  Affect.   Data Reviewed: CT scan Lab Results  Component Value Date   WBC 7.1 07/29/2012   HGB 14.4 07/29/2012   HCT 41.9 07/29/2012   MCV 90.7 07/29/2012   PLT 294.0 07/29/2012     Chemistry      Component Value Date/Time   NA 137 07/29/2012 1624   K 3.7 07/29/2012 1624   CL 102 07/29/2012 1624   CO2 24 07/29/2012 1624   BUN 10 07/29/2012 1624   CREATININE 0.7 07/29/2012 1624      Component Value Date/Time   CALCIUM 9.6 07/29/2012 1624   ALKPHOS 72 07/29/2012 1624   AST 25 07/29/2012 1624   ALT 31 07/29/2012 1624   BILITOT 0.6 07/29/2012 1624        Assessment & Plan:  Abdominal pain, epigastric  Chronic calcific pancreatitis  Abnormal CT scan, stomach  Alcohol abuse   1. Not entirely clear what is causing her problems but she is drinking too much and looks like she has chronic pancreatitis. 2. Thickened stomach probably collapsed walls and not pathologic. 3. Will evaluate with lipase and amylase and EGD. The risks and benefits as well as alternatives of endoscopic procedure(s) have been discussed and reviewed. All questions answered. The patient agrees to proceed. 4. She needs to eliminate alcohol, that itself may help her sxs.  I appreciate the opportunity to care for you.   ZO:XWRUE John, MD  Lab Results  Component Value Date   AMYLASE 46 08/12/2012   Lab Results  Component Value Date   LIPASE 24.0 08/12/2012

## 2012-08-13 ENCOUNTER — Other Ambulatory Visit: Payer: Self-pay | Admitting: Anesthesiology

## 2012-08-13 DIAGNOSIS — Z1211 Encounter for screening for malignant neoplasm of colon: Secondary | ICD-10-CM

## 2012-08-18 ENCOUNTER — Telehealth: Payer: Self-pay | Admitting: Internal Medicine

## 2012-08-18 NOTE — Telephone Encounter (Signed)
Patient advised of the results of labs.  All questions answered

## 2012-08-19 ENCOUNTER — Encounter: Payer: Self-pay | Admitting: Gynecology

## 2012-09-03 ENCOUNTER — Encounter: Payer: Self-pay | Admitting: Internal Medicine

## 2012-09-03 ENCOUNTER — Ambulatory Visit (AMBULATORY_SURGERY_CENTER): Payer: Self-pay | Admitting: Internal Medicine

## 2012-09-03 VITALS — BP 127/74 | HR 78 | Temp 98.6°F | Resp 23 | Ht 64.0 in | Wt 142.0 lb

## 2012-09-03 DIAGNOSIS — R1013 Epigastric pain: Secondary | ICD-10-CM

## 2012-09-03 MED ORDER — SODIUM CHLORIDE 0.9 % IV SOLN: 500.0000 mL | INTRAVENOUS | Status: DC

## 2012-09-03 NOTE — Patient Instructions (Addendum)
The esophagus, stomach and duodenum were normal.  I think your pancreas has been damaged from alcohol and recommend you stop alcohol to see if that helps.  Please schedule a follow-up to see me in September.  I appreciate the opportunity to care for you. Iva Boop, MD, [facility]  Discharge instructions given with verbal understanding. Normal exam. Creon a bottle given per Dr. Leone Payor. Call Dr. Leone Payor in two weeks to let him know how you feel. Resume previous medications. YOU HAD AN ENDOSCOPIC PROCEDURE TODAY AT THE [facility]: Refer to the procedure report that was given to you for any specific questions about what was found during the examination.  If the procedure report does not answer your questions, please call your gastroenterologist to clarify.  If you requested that your care partner not be given the details of your procedure findings, then the procedure report has been included in a sealed envelope for you to review at your convenience later.  YOU SHOULD EXPECT: Some feelings of bloating in the abdomen. Passage of more gas than usual.  Walking can help get rid of the air that was put into your GI tract during the procedure and reduce the bloating. If you had a lower endoscopy (such as a colonoscopy or flexible sigmoidoscopy) you may notice spotting of blood in your stool or on the toilet paper. If you underwent a bowel prep for your procedure, then you may not have a normal bowel movement for a few days.  DIET: Your first meal following the procedure should be a light meal and then it is ok to progress to your normal diet.  A half-sandwich or bowl of soup is an example of a good first meal.  Heavy or fried foods are harder to digest and may make you feel nauseous or bloated.  Likewise meals heavy in dairy and vegetables can cause extra gas to form and this can also increase the bloating.  Drink plenty of fluids but you should avoid alcoholic beverages for 24  hours.  ACTIVITY: Your care partner should take you home directly after the procedure.  You should plan to take it easy, moving slowly for the rest of the day.  You can resume normal activity the day after the procedure however you should NOT DRIVE or use heavy machinery for 24 hours (because of the sedation medicines used during the test).    SYMPTOMS TO REPORT IMMEDIATELY: A gastroenterologist can be reached at any hour.  During normal business hours, 8:30 AM to 5:00 PM Monday through Friday, call [phone].  After hours and on weekends, please call the GI answering service at [phone] who will take a message and have the physician on call contact you.   Following upper endoscopy (EGD)  Vomiting of blood or coffee ground material  New chest pain or pain under the shoulder blades  Painful or persistently difficult swallowing  New shortness of breath  Fever of 100F or higher  Black, tarry-looking stools  FOLLOW UP: If any biopsies were taken you will be contacted by phone or by letter within the next 1-3 weeks.  Call your gastroenterologist if you have not heard about the biopsies in 3 weeks.  Our staff will call the home number listed on your records the next business day following your procedure to check on you and address any questions or concerns that you may have at that time regarding the information given to you following your procedure. This is a courtesy call  and so if there is no answer at the home number and we have not heard from you through the emergency physician on call, we will assume that you have returned to your regular daily activities without incident.  SIGNATURES/CONFIDENTIALITY: You and/or your care partner have signed paperwork which will be entered into your electronic medical record.  These signatures attest to the fact that that the information above on your After Visit Summary has been reviewed and is understood.  Full responsibility of the confidentiality  of this discharge information lies with you and/or your care-partner.

## 2012-09-03 NOTE — Progress Notes (Signed)
Patient did not experience any of the following events: a burn prior to discharge; a fall within the facility; wrong site/side/patient/procedure/implant event; or a hospital transfer or hospital admission upon discharge from the facility. (G8907) Patient did not have preoperative order for IV antibiotic SSI prophylaxis. (G8918)  

## 2012-09-03 NOTE — Progress Notes (Signed)
1645 COUGHING NOTED DESAT 75 POSITIVE VENTILATION WITH AMBU FOR LESS THAN 1 MINUTE .SUCTIONED TOL WELL.

## 2012-09-03 NOTE — Op Note (Signed)
[facility] [address] [place], [address]   ENDOSCOPY PROCEDURE REPORT  PATIENT: Tracey Fields, Tracey Fields  MR#: [id number] BIRTHDATE: 04-16-1960 , 51  yrs. old GENDER: Female ENDOSCOPIST: Iva Boop, MD, [facility] PROCEDURE DATE:  09/03/2012 PROCEDURE:  EGD, diagnostic ASA CLASS:     Class II INDICATIONS:  Epigastric pain. MEDICATIONS: Propofol (Diprivan) 260 mg IV, MAC sedation, administered by CRNA, and These medications were titrated to patient response per physician's verbal order TOPICAL ANESTHETIC: Cetacaine Spray  DESCRIPTION OF PROCEDURE: After the risks benefits and alternatives of the procedure were thoroughly explained, informed consent was obtained.  The LB [id number] endoscope was introduced through the mouth and advanced to the second portion of the duodenum. Without limitations.  The instrument was slowly withdrawn as the mucosa was fully examined.      The upper, middle and distal third of the esophagus were carefully inspected and no abnormalities were noted.  The z-line was well seen at the GEJ.  The endoscope was pushed into the fundus which was normal including a retroflexed view.  The antrum, gastric body, first and second part of the duodenum were unremarkable. Retroflexed views revealed no abnormalities.     The scope was then withdrawn from the patient and the procedure completed.  COMPLICATIONS: There were no complications. ENDOSCOPIC IMPRESSION: Normal EGD  RECOMMENDATIONS: Stop EtOH to see if that helps - she has chronic pancreatitis see Dr.  Leone Payor in September   eSigned:  Iva Boop, MD, [facility] 09/03/2012 4:26 PM   UY:[id number] Ellin Mayhew, MD and The Patient

## 2012-09-04 ENCOUNTER — Telehealth: Payer: Self-pay

## 2012-09-04 ENCOUNTER — Ambulatory Visit: Payer: Self-pay | Admitting: Internal Medicine

## 2012-09-04 NOTE — Telephone Encounter (Signed)
Unable to leave message. Number is restricted from receiving certain calls.

## 2012-09-05 ENCOUNTER — Telehealth: Payer: Self-pay | Admitting: Internal Medicine

## 2012-09-05 NOTE — Telephone Encounter (Signed)
Pt was advised to take Creon when she eats, if she does not eat then she does not have to take it.  Pt agreed and thanked me for calling

## 2012-09-17 ENCOUNTER — Telehealth: Payer: Self-pay | Admitting: *Deleted

## 2012-09-17 NOTE — Telephone Encounter (Signed)
ToRoma Schanz Fax: [phone] From: [facility] Date/ Time: 09/16/2012 5:06 PM Taken By: Jethro BolusDarl Pikes Facility: home Patient: Tracey Fields, Tracey Fields DOB: [birth year] Phone: [phone] Reason for Call: wants to know name of med ordered 10/19/2010 for diverticulitis, which she never took, advised best not to use those Flagyl and Cipro that were ordered, out of date. States symptoms have returned. Regarding Appointment: Appt Date: Appt Time: Unknown Provider: Reason: Details:

## 2012-09-19 ENCOUNTER — Telehealth: Payer: Self-pay | Admitting: Internal Medicine

## 2012-09-19 MED ORDER — PANCRELIPASE (LIP-PROT-AMYL) 36000-114000 UNITS PO CPEP: 1.0000 | ORAL_CAPSULE | Freq: Three times a day (TID) | ORAL | Status: DC

## 2012-09-19 NOTE — Telephone Encounter (Signed)
Spoke to patient and put Creon 36,000 samples up front for pick up today.

## 2012-10-01 ENCOUNTER — Encounter: Payer: Self-pay | Admitting: Internal Medicine

## 2012-10-01 ENCOUNTER — Ambulatory Visit (INDEPENDENT_AMBULATORY_CARE_PROVIDER_SITE_OTHER): Payer: Self-pay | Admitting: Internal Medicine

## 2012-10-01 VITALS — BP 90/60 | HR 80 | Ht 65.0 in | Wt 135.2 lb

## 2012-10-01 DIAGNOSIS — K439 Ventral hernia without obstruction or gangrene: Secondary | ICD-10-CM

## 2012-10-01 DIAGNOSIS — Z598 Other problems related to housing and economic circumstances: Secondary | ICD-10-CM | POA: Insufficient documentation

## 2012-10-01 DIAGNOSIS — K861 Other chronic pancreatitis: Secondary | ICD-10-CM

## 2012-10-01 DIAGNOSIS — K5732 Diverticulitis of large intestine without perforation or abscess without bleeding: Secondary | ICD-10-CM

## 2012-10-01 MED ORDER — CIPROFLOXACIN HCL 500 MG PO TABS: 500.0000 mg | ORAL_TABLET | Freq: Two times a day (BID) | ORAL | Status: DC

## 2012-10-01 MED ORDER — METRONIDAZOLE 500 MG PO TABS: 500.0000 mg | ORAL_TABLET | Freq: Two times a day (BID) | ORAL | Status: DC

## 2012-10-01 MED ORDER — PANCRELIPASE (LIP-PROT-AMYL) 36000-114000 UNITS PO CPEP: 36000.0000 [IU] | ORAL_CAPSULE | Freq: Three times a day (TID) | ORAL | Status: DC

## 2012-10-01 NOTE — Progress Notes (Signed)
  Subjective:    Patient ID: Tracey Fields, female    DOB: 1960/09/13, 52 y.o.   MRN: [id number]  HPI Rupal returns for f/u after she was evaluated with an EGD (normal) for epigastric pain. CT scan has shown calcifications in the pancreas consistent with chronic pancreatitis. She has greatly reduced EtOH and is drinking about 7 drinks per week overall, and also thinks that the Creon is greatly helping the epigastric pain and bloating. She does not have any bowel habit changes (no diarrhea or constipation). Wt Readings from Last 3 Encounters:  10/01/12 135 lb 4 oz (61.349 kg)  09/03/12 142 lb (64.411 kg)  08/12/12 142 lb (64.411 kg)   She did have an episode of LLQ pain that was consistent with what she thinks is diverticulitis and improved with cipro and metronidazole  She is concerned about bulging upper abdomen, hernia and if its ok.  Medications, allergies, past medical history, past surgical history, family history and social history are reviewed and updated in the EMR.  Review of Systems As above    Objective:   Physical Exam General:  NAD Abdomen:  soft and nontender, BS+    Assessment & Plan:  Chronic pancreatitis   See problem-based charting  Diverticulitis of colon? -   possibly based upon clinical hx - have written Rx dfor Cipro/Metronidazole - she may take if recurrent persistent pain and is to call me an let me know Could also be an IBS issue but based upon the hx it seems possible it is diverticulitis - could need CT scanning when symptomatic  Ventral hernia  Colon Cancer screening - due for colonoscopy - holding off for now

## 2012-10-01 NOTE — Assessment & Plan Note (Signed)
Based upon CT, likely from EtOH given hx of regular significant consumption. She is better on Creon and with reduced EtOH. I have advised her to try to eliminate EtOH. Will continue Creon. Samples and hopefully she will be able to get compassionate use discount from company since she is uninsured. See me 6 months. Weight is down - could be from reduced EtOH - she does not sound like she has malabsorption.

## 2012-10-01 NOTE — Patient Instructions (Addendum)
Please continue to reduce your consumption of alcohol.  Today we are giving you samples of creon to use, if you are able to get assistance and need an rx for this let us know.  We are giving you printed rx's for Cipro and Flagyl, if you use these call us to let us know.  Follow up with [place] in 6 months.   I appreciate the opportunity to care for you.

## 2012-11-13 ENCOUNTER — Encounter: Payer: Self-pay | Admitting: Internal Medicine

## 2012-11-13 NOTE — Progress Notes (Signed)
Patient ID: Tracey Fields, female   DOB: 1961/01/01, 52 y.o.   MRN: [id number] [employer] patient assistance forms to [phone], they had been sent in missing a signature .  Copy sent to be scanned into epic and original mailed back to patient.  Spoke to patient and she said ok for [place] to fax back to [place] for her.  This is for her Creon 12,000 lipase units.

## 2012-12-03 ENCOUNTER — Other Ambulatory Visit: Payer: Self-pay | Admitting: Gynecology

## 2012-12-03 DIAGNOSIS — Z1231 Encounter for screening mammogram for malignant neoplasm of breast: Secondary | ICD-10-CM

## 2012-12-04 ENCOUNTER — Other Ambulatory Visit: Payer: Self-pay

## 2012-12-04 MED ORDER — ZOLPIDEM TARTRATE 10 MG PO TABS: 10.0000 mg | ORAL_TABLET | Freq: Every evening | ORAL | Status: DC | PRN

## 2012-12-04 NOTE — Telephone Encounter (Signed)
rx re-done

## 2012-12-04 NOTE — Telephone Encounter (Signed)
Did not receive hardcopy 

## 2012-12-04 NOTE — Telephone Encounter (Signed)
Done hardcopy to robin  

## 2012-12-05 NOTE — Telephone Encounter (Signed)
Faxed hardcopy to [facility] [place]  

## 2012-12-11 ENCOUNTER — Other Ambulatory Visit: Payer: Self-pay

## 2012-12-19 ENCOUNTER — Encounter: Payer: Self-pay | Admitting: Cardiology

## 2012-12-24 ENCOUNTER — Ambulatory Visit (HOSPITAL_COMMUNITY)
Admission: RE | Admit: 2012-12-24 | Discharge: 2012-12-24 | Disposition: A | Discharge status: Home / Self Care | Payer: Medicaid Other | Source: Ambulatory Visit | Attending: Gynecology | Admitting: Gynecology

## 2012-12-24 DIAGNOSIS — Z1231 Encounter for screening mammogram for malignant neoplasm of breast: Secondary | ICD-10-CM

## 2013-03-02 ENCOUNTER — Other Ambulatory Visit: Payer: Self-pay | Admitting: Internal Medicine

## 2013-03-04 ENCOUNTER — Telehealth: Payer: Self-pay | Admitting: Internal Medicine

## 2013-03-04 NOTE — Telephone Encounter (Signed)
Spoke with patient, she made her 6 month follow up appointment that you had requested at last visit Aug. 2014.  She's not sure how to take the creon, I told her I would confirm with Dr. Carlean Purl before sending in rx.  She googled information and has been taking 4 tabs on average with each meal , but depending on the fat in the meal may take more.  She said they sent her the 12,000 USP units of lipase capsules.  Please advise Sir, thank you.

## 2013-03-05 NOTE — Telephone Encounter (Signed)
Left message for patient to call me back. 

## 2013-03-05 NOTE — Telephone Encounter (Signed)
The dosing she had was 36K units with meals - so she is taking 12 K more than I rxed but if that is what it takes to control the diarrhea sxs ok

## 2013-03-06 MED ORDER — PANCRELIPASE (LIP-PROT-AMYL) 36000-114000 UNITS PO CPEP: 36000.0000 [IU] | ORAL_CAPSULE | Freq: Three times a day (TID) | ORAL | Status: DC

## 2013-03-06 NOTE — Telephone Encounter (Signed)
Creon rx faxed to [phone] , [employer] per pt request.

## 2013-03-12 ENCOUNTER — Telehealth: Payer: Self-pay | Admitting: Internal Medicine

## 2013-03-12 MED ORDER — PANCRELIPASE (LIP-PROT-AMYL) 36000-114000 UNITS PO CPEP: 36000.0000 [IU] | ORAL_CAPSULE | Freq: Three times a day (TID) | ORAL | Status: DC

## 2013-03-12 NOTE — Telephone Encounter (Signed)
Re-printed creon rx and got Dr. Carlean Purl to sign it and faxed it to  [employer] at [phone] as requested.

## 2013-04-08 ENCOUNTER — Ambulatory Visit (INDEPENDENT_AMBULATORY_CARE_PROVIDER_SITE_OTHER): Payer: Medicaid Other | Admitting: Internal Medicine

## 2013-04-08 ENCOUNTER — Encounter: Payer: Self-pay | Admitting: Internal Medicine

## 2013-04-08 VITALS — BP 136/80 | HR 64 | Ht 64.0 in | Wt 142.6 lb

## 2013-04-08 DIAGNOSIS — K861 Other chronic pancreatitis: Secondary | ICD-10-CM

## 2013-04-08 DIAGNOSIS — Z1211 Encounter for screening for malignant neoplasm of colon: Secondary | ICD-10-CM

## 2013-04-08 DIAGNOSIS — F1099 Alcohol use, unspecified with unspecified alcohol-induced disorder: Secondary | ICD-10-CM

## 2013-04-08 DIAGNOSIS — F1010 Alcohol abuse, uncomplicated: Secondary | ICD-10-CM

## 2013-04-08 NOTE — Patient Instructions (Addendum)
You have been given a separate informational sheet regarding your tobacco use, the importance of quitting and local resources to help you quit.   It has been recommended to you by your physician that you have a(n) colonoscopy completed.We did not schedule the procedure(s) today. We will contact you to set up a pre-visit and colonoscopy appointment when the schedule is out.   Today you have been given a low fat diet handout to read and follow.   I appreciate the opportunity to care for you.

## 2013-04-08 NOTE — Progress Notes (Signed)
         Subjective:    Patient ID: Tracey Fields, female    DOB: 1960-09-18, 53 y.o.   MRN: [id number]  HPI Tracey Fields is here for f/u of chronic pancreatitis sxs - actually ok on current dose of Creon. She continues to drink alcohol "less". She has ?'s about what to eat and timing of Creon - answered. She has been ok w/o Creon at snacks. Is ready to pursue screening colonoscopy.  Medications, allergies, past medical history, past surgical history, family history and social history are reviewed and updated in the EMR.  Review of Systems As above    Objective:   Physical Exam General:  NAD Eyes:   anicteric Lungs:  clear Heart:  S1S2 no rubs, murmurs or gallops Abdomen:  soft and nontender, BS+ Ext:   no edema    Data Reviewed:   Wt Readings from Last 3 Encounters:  04/08/13 142 lb 9.6 oz (64.683 kg)  10/01/12 135 lb 4 oz (61.349 kg)  09/03/12 142 lb (64.411 kg)      Assessment & Plan:  Chronic pancreatitis  Alcohol intake above recommended sensible limits with complication  Special screening for malignant neoplasms, colon  She will call back to schedule colonoscopy. The risks and benefits as well as alternatives of endoscopic procedure(s) have been discussed and reviewed. All questions answered. The patient agrees to proceed.

## 2013-04-08 NOTE — Assessment & Plan Note (Addendum)
Stable Stay on Creon 36K w/ meals Discussed diet - stopping EtOH - still drinking some

## 2013-04-09 NOTE — Assessment & Plan Note (Signed)
I have again recommended abstinence

## 2013-04-16 ENCOUNTER — Encounter: Payer: Self-pay | Admitting: Internal Medicine

## 2013-04-22 ENCOUNTER — Other Ambulatory Visit: Payer: Self-pay | Admitting: Internal Medicine

## 2013-04-24 ENCOUNTER — Telehealth: Payer: Self-pay | Admitting: Internal Medicine

## 2013-04-24 NOTE — Telephone Encounter (Signed)
Agree 

## 2013-04-24 NOTE — Telephone Encounter (Signed)
Patient called and stated that she was given a rx for cipro and flagyl to keep on hand for diverticulitis by Dr. Carlean Purl last year.  She started with LLQ pain a few days ago, is tender in the LLQ and having difficulty ambulating.  She reports that she had rx filled and has taken 1 1/2 days and she feels not better.  She also reports a "terrible" HA. She questions "If something could blow up in there" ?  Patient wants to know what to do.  She is advised to start a clear liquid diet and if her pain does not improve or worsens, or she develops new symptoms she should be evaluated in the ER or Urgent Care.

## 2013-04-25 ENCOUNTER — Emergency Department (HOSPITAL_COMMUNITY): Payer: Medicaid Other

## 2013-04-25 ENCOUNTER — Encounter (HOSPITAL_COMMUNITY): Payer: Self-pay | Admitting: Emergency Medicine

## 2013-04-25 ENCOUNTER — Inpatient Hospital Stay (HOSPITAL_COMMUNITY)
Admission: EM | Admit: 2013-04-25 | Discharge: 2013-05-01 | DRG: 372 | Disposition: A | Discharge status: Home / Self Care | Payer: Medicaid Other | Attending: Interventional Radiology | Admitting: Interventional Radiology

## 2013-04-25 DIAGNOSIS — K5720 Diverticulitis of large intestine with perforation and abscess without bleeding: Secondary | ICD-10-CM | POA: Diagnosis present

## 2013-04-25 DIAGNOSIS — F3289 Other specified depressive episodes: Secondary | ICD-10-CM | POA: Diagnosis present

## 2013-04-25 DIAGNOSIS — Z8249 Family history of ischemic heart disease and other diseases of the circulatory system: Secondary | ICD-10-CM

## 2013-04-25 DIAGNOSIS — F17200 Nicotine dependence, unspecified, uncomplicated: Secondary | ICD-10-CM | POA: Diagnosis present

## 2013-04-25 DIAGNOSIS — F329 Major depressive disorder, single episode, unspecified: Secondary | ICD-10-CM | POA: Diagnosis present

## 2013-04-25 DIAGNOSIS — K5732 Diverticulitis of large intestine without perforation or abscess without bleeding: Secondary | ICD-10-CM | POA: Diagnosis present

## 2013-04-25 DIAGNOSIS — I10 Essential (primary) hypertension: Secondary | ICD-10-CM | POA: Diagnosis present

## 2013-04-25 DIAGNOSIS — Z7982 Long term (current) use of aspirin: Secondary | ICD-10-CM

## 2013-04-25 DIAGNOSIS — B370 Candidal stomatitis: Secondary | ICD-10-CM | POA: Diagnosis not present

## 2013-04-25 DIAGNOSIS — Z833 Family history of diabetes mellitus: Secondary | ICD-10-CM

## 2013-04-25 DIAGNOSIS — E876 Hypokalemia: Secondary | ICD-10-CM | POA: Diagnosis present

## 2013-04-25 DIAGNOSIS — K630 Abscess of intestine: Principal | ICD-10-CM | POA: Diagnosis present

## 2013-04-25 DIAGNOSIS — Z801 Family history of malignant neoplasm of trachea, bronchus and lung: Secondary | ICD-10-CM

## 2013-04-25 DIAGNOSIS — Z79899 Other long term (current) drug therapy: Secondary | ICD-10-CM

## 2013-04-25 DIAGNOSIS — K5780 Diverticulitis of intestine, part unspecified, with perforation and abscess without bleeding: Secondary | ICD-10-CM

## 2013-04-25 DIAGNOSIS — F411 Generalized anxiety disorder: Secondary | ICD-10-CM | POA: Diagnosis present

## 2013-04-25 DIAGNOSIS — Z888 Allergy status to other drugs, medicaments and biological substances status: Secondary | ICD-10-CM

## 2013-04-25 DIAGNOSIS — K861 Other chronic pancreatitis: Secondary | ICD-10-CM | POA: Diagnosis present

## 2013-04-25 LAB — COMPREHENSIVE METABOLIC PANEL
ALBUMIN: 3.6 g/dL (ref 3.5–5.2)
ALT: 15 U/L (ref 0–35)
AST: 15 U/L (ref 0–37)
Alkaline Phosphatase: 122 U/L — ABNORMAL HIGH (ref 39–117)
BUN: 5 mg/dL — ABNORMAL LOW (ref 6–23)
CO2: 23 meq/L (ref 19–32)
CREATININE: 0.71 mg/dL (ref 0.50–1.10)
Calcium: 9.3 mg/dL (ref 8.4–10.5)
Chloride: 97 mEq/L (ref 96–112)
GFR calc Af Amer: >90 mL/min (ref >90)
GFR calc non Af Amer: >90 mL/min (ref >90)
Glucose, Bld: 129 mg/dL — ABNORMAL HIGH (ref 70–99)
Potassium: 3.5 mEq/L — ABNORMAL LOW (ref 3.7–5.3)
SODIUM: 137 meq/L (ref 137–147)
Total Bilirubin: 0.3 mg/dL (ref 0.3–1.2)
Total Protein: 7.9 g/dL (ref 6.0–8.3)

## 2013-04-25 LAB — CBC WITH DIFFERENTIAL/PLATELET
BASOS ABS: 0 10*3/uL (ref 0.0–0.1)
BASOS PCT: 0 % (ref 0–1)
Eosinophils Absolute: 0 10*3/uL (ref 0.0–0.7)
Eosinophils Relative: 0 % (ref 0–5)
HEMATOCRIT: 36.3 % (ref 36.0–46.0)
Hemoglobin: 13.2 g/dL (ref 12.0–15.0)
LYMPHS PCT: 8 % — AB (ref 12–46)
Lymphs Abs: 1.1 10*3/uL (ref 0.7–4.0)
MCH: 31.4 pg (ref 26.0–34.0)
MCHC: 36.4 g/dL — AB (ref 30.0–36.0)
MCV: 86.4 fL (ref 78.0–100.0)
MONO ABS: 1.1 10*3/uL — AB (ref 0.1–1.0)
Monocytes Relative: 9 % (ref 3–12)
NEUTROS ABS: 11.1 10*3/uL — AB (ref 1.7–7.7)
Neutrophils Relative %: 83 % — ABNORMAL HIGH (ref 43–77)
PLATELETS: 331 10*3/uL (ref 150–400)
RBC: 4.2 MIL/uL (ref 3.87–5.11)
RDW: 13.6 % (ref 11.5–15.5)
WBC: 13.3 10*3/uL — AB (ref 4.0–10.5)

## 2013-04-25 LAB — URINALYSIS, ROUTINE W REFLEX MICROSCOPIC
Bilirubin Urine: NEGATIVE
GLUCOSE, UA: NEGATIVE mg/dL
Ketones, ur: NEGATIVE mg/dL
Nitrite: NEGATIVE
PH: 5.5 (ref 5.0–8.0)
Protein, ur: 100 mg/dL — AB
Specific Gravity, Urine: 1.018 (ref 1.005–1.030)
Urobilinogen, UA: 0.2 mg/dL (ref 0.0–1.0)

## 2013-04-25 LAB — URINE MICROSCOPIC-ADD ON

## 2013-04-25 LAB — PREGNANCY, URINE: PREG TEST UR: NEGATIVE

## 2013-04-25 LAB — LIPASE, BLOOD: Lipase: 18 U/L (ref 11–59)

## 2013-04-25 MED ORDER — FENTANYL CITRATE 0.05 MG/ML IJ SOLN
50.0000 ug | Freq: Once | INTRAMUSCULAR | Status: AC
  Administered 2013-04-25: 50 ug via INTRAVENOUS
  Filled 2013-04-25: qty 2

## 2013-04-25 MED ORDER — IOHEXOL 300 MG/ML  SOLN
25.0000 mL | Freq: Once | INTRAMUSCULAR | Status: AC | PRN
  Administered 2013-04-25: 25 mL via ORAL

## 2013-04-25 MED ORDER — SODIUM CHLORIDE 0.9 % IV BOLUS (SEPSIS)
1000.0000 mL | Freq: Once | INTRAVENOUS | Status: AC
  Administered 2013-04-25: 1000 mL via INTRAVENOUS

## 2013-04-25 MED ORDER — VANCOMYCIN HCL IN DEXTROSE 1-5 GM/200ML-% IV SOLN: 1000.0000 mg | Freq: Once | INTRAVENOUS | Status: DC

## 2013-04-25 MED ORDER — FENTANYL CITRATE 0.05 MG/ML IJ SOLN: 50.0000 ug | INTRAMUSCULAR | Status: DC

## 2013-04-25 MED ORDER — ONDANSETRON HCL 4 MG/2ML IJ SOLN
4.0000 mg | Freq: Once | INTRAMUSCULAR | Status: AC
  Administered 2013-04-25: 4 mg via INTRAVENOUS
  Filled 2013-04-25: qty 2

## 2013-04-25 MED ORDER — ONDANSETRON HCL 4 MG/2ML IJ SOLN: 4.0000 mg | INTRAMUSCULAR | Status: DC

## 2013-04-25 MED ORDER — IOHEXOL 300 MG/ML  SOLN
80.0000 mL | Freq: Once | INTRAMUSCULAR | Status: AC | PRN
  Administered 2013-04-25: 80 mL via INTRAVENOUS

## 2013-04-25 MED ORDER — PIPERACILLIN-TAZOBACTAM 3.375 G IVPB 30 MIN
3.3750 g | Freq: Once | INTRAVENOUS | Status: AC
  Administered 2013-04-25: 3.375 g via INTRAVENOUS
  Filled 2013-04-25: qty 50

## 2013-04-25 MED ORDER — HYDROMORPHONE HCL PF 1 MG/ML IJ SOLN
1.0000 mg | Freq: Once | INTRAMUSCULAR | Status: AC
  Administered 2013-04-25: 1 mg via INTRAVENOUS
  Filled 2013-04-25: qty 1

## 2013-04-25 MED ORDER — MORPHINE SULFATE 4 MG/ML IJ SOLN
4.0000 mg | Freq: Once | INTRAMUSCULAR | Status: AC
  Administered 2013-04-25: 4 mg via INTRAVENOUS
  Filled 2013-04-25: qty 1

## 2013-04-25 NOTE — ED Notes (Signed)
Pt presents to department for evaluation of diffuse abdominal pain. Ongoing x5 days. Pt states history of diverticulitis. 9/10 pain upon arrival. Also states nausea. Pt is alert and oriented x4.

## 2013-04-25 NOTE — ED Provider Notes (Signed)
Patient is a 53 year old female past medical history significant for diverticulosis, back pain, anxiety, depression, hypertension, IBS, chronic pancreatitis presented to the emergency department for 5 days of severe worsening sharp left lower quadrant abdominal pain with associated nausea and decreased appetite. No emesis. No diarrhea. Last normal bowel movement was 3 days ago. Patient has still had flatus. No abdominal surgical history. Currently being treated with by mouth Cipro and Flagyl by gastroenterologist Dr. Carlean Purl. Patient does not have a Education officer, environmental.  Physical Exam  BP 119/84  Pulse 104  Temp(Src) 98.9 F (37.2 C) (Oral)  Resp 18  SpO2 96%  Physical Exam  Nursing note and vitals reviewed. Constitutional: She is oriented to person, place, and time. She appears well-developed and well-nourished. No distress.  HENT:  Head: Normocephalic and atraumatic.  Right Ear: External ear normal.  Left Ear: External ear normal.  Nose: Nose normal.  Eyes: Conjunctivae are normal.  Neck: Neck supple.  Cardiovascular: Normal rate, regular rhythm and normal heart sounds.   Pulmonary/Chest: Effort normal and breath sounds normal.  Abdominal: Soft. Bowel sounds are normal. There is tenderness in the right lower quadrant, suprapubic area and left lower quadrant. There is guarding. There is no rigidity.  Musculoskeletal: Normal range of motion.  Neurological: She is alert and oriented to person, place, and time.  Skin: Skin is warm and dry. She is not diaphoretic.    ED Course  Procedures Medications  vancomycin (VANCOCIN) IVPB 1000 mg/200 mL premix (not administered)  piperacillin-tazobactam (ZOSYN) IVPB 3.375 g (not administered)  sodium chloride 0.9 % bolus 1,000 mL (1,000 mLs Intravenous New Bag/Given 04/25/13 1914)  morphine 4 MG/ML injection 4 mg (4 mg Intravenous Given 04/25/13 1913)  ondansetron (ZOFRAN) injection 4 mg (4 mg Intravenous Given 04/25/13 1913)  iohexol (OMNIPAQUE) 300  MG/ML solution 25 mL (25 mLs Oral Contrast Given 04/25/13 1924)  fentaNYL (SUBLIMAZE) injection 50 mcg (50 mcg Intravenous Given 04/25/13 2033)  iohexol (OMNIPAQUE) 300 MG/ML solution 80 mL (80 mLs Intravenous Contrast Given 04/25/13 2111)  HYDROmorphone (DILAUDID) injection 1 mg (1 mg Intravenous Given 04/25/13 2222)     Results for orders placed during the hospital encounter of 04/25/13  CBC WITH DIFFERENTIAL      Result Value Ref Range   WBC 13.3 (*) 4.0 - 10.5 K/uL   RBC 4.20  3.87 - 5.11 MIL/uL   Hemoglobin 13.2  12.0 - 15.0 g/dL   HCT 36.3  36.0 - 46.0 %   MCV 86.4  78.0 - 100.0 fL   MCH 31.4  26.0 - 34.0 pg   MCHC 36.4 (*) 30.0 - 36.0 g/dL   RDW 13.6  11.5 - 15.5 %   Platelets 331  150 - 400 K/uL   Neutrophils Relative % 83 (*) 43 - 77 %   Neutro Abs 11.1 (*) 1.7 - 7.7 K/uL   Lymphocytes Relative 8 (*) 12 - 46 %   Lymphs Abs 1.1  0.7 - 4.0 K/uL   Monocytes Relative 9  3 - 12 %   Monocytes Absolute 1.1 (*) 0.1 - 1.0 K/uL   Eosinophils Relative 0  0 - 5 %   Eosinophils Absolute 0.0  0.0 - 0.7 K/uL   Basophils Relative 0  0 - 1 %   Basophils Absolute 0.0  0.0 - 0.1 K/uL  COMPREHENSIVE METABOLIC PANEL      Result Value Ref Range   Sodium 137  137 - 147 mEq/L   Potassium 3.5 (*) 3.7 - 5.3 mEq/L  Chloride 97  96 - 112 mEq/L   CO2 23  19 - 32 mEq/L   Glucose, Bld 129 (*) 70 - 99 mg/dL   BUN 5 (*) 6 - 23 mg/dL   Creatinine, Ser 0.71  0.50 - 1.10 mg/dL   Calcium 9.3  8.4 - 10.5 mg/dL   Total Protein 7.9  6.0 - 8.3 g/dL   Albumin 3.6  3.5 - 5.2 g/dL   AST 15  0 - 37 U/L   ALT 15  0 - 35 U/L   Alkaline Phosphatase 122 (*) 39 - 117 U/L   Total Bilirubin 0.3  0.3 - 1.2 mg/dL   GFR calc non Af Amer >90  >90 mL/min   GFR calc Af Amer >90  >90 mL/min  LIPASE, BLOOD      Result Value Ref Range   Lipase 18  11 - 59 U/L  URINALYSIS, ROUTINE W REFLEX MICROSCOPIC      Result Value Ref Range   Color, Urine YELLOW  YELLOW   APPearance CLEAR  CLEAR   Specific Gravity, Urine 1.018   1.005 - 1.030   pH 5.5  5.0 - 8.0   Glucose, UA NEGATIVE  NEGATIVE mg/dL   Hgb urine dipstick SMALL (*) NEGATIVE   Bilirubin Urine NEGATIVE  NEGATIVE   Ketones, ur NEGATIVE  NEGATIVE mg/dL   Protein, ur 100 (*) NEGATIVE mg/dL   Urobilinogen, UA 0.2  0.0 - 1.0 mg/dL   Nitrite NEGATIVE  NEGATIVE   Leukocytes, UA TRACE (*) NEGATIVE  URINE MICROSCOPIC-ADD ON      Result Value Ref Range   Squamous Epithelial / LPF FEW (*) RARE   WBC, UA 0-2  <3 WBC/hpf   RBC / HPF 3-6  <3 RBC/hpf   Bacteria, UA RARE  RARE  PREGNANCY, URINE      Result Value Ref Range   Preg Test, Ur NEGATIVE  NEGATIVE   Ct Abdomen Pelvis W Contrast  04/25/2013   CLINICAL DATA:  Left lower quadrant abdominal pain 5 days  EXAM: CT ABDOMEN AND PELVIS WITH CONTRAST  TECHNIQUE: Multidetector CT imaging of the abdomen and pelvis was performed using the standard protocol following bolus administration of intravenous contrast.  CONTRAST:  69mL OMNIPAQUE IOHEXOL 300 MG/ML  SOLN  COMPARISON:  CT ABD/PELVIS W CM dated 07/30/2012  FINDINGS: Lung bases are clear.  No pericardial fluid.  No focal hepatic lesion. The gallbladder, pancreas, spleen, adrenal glands, kidneys demonstrate no acute findings. There is calcifications of the pancreatic head similar prior consistent with chronic pancreatitis.  The stomach, small bowel, and cecum normal. There is a fluid collection along the posterior wall of the descending colon measuring 3.7 x 3.5 cm. This has a thin enhancing rim and extends inferiorly along the pericolic gutter. This finding is most consistent with ruptured diverticulitis with abscess formation. The abscess extends to involve the left psoas muscle and iliacus muscle to a minimal degree (image 50 series 2). No evidence of bowel obstruction. No evidence of frank leak of the oral contrast. Contrast flows entirety of the colon to the rectum.  Abdominal or is normal caliber. No retroperitoneal periportal lymphadenopathy.  No free fluid the  pelvis. Uterus and ovaries are normal. No pelvic lymphadenopathy. No aggressive osseous lesion.  IMPRESSION: 1. Abscess along the descending colon is most consistent with ruptured diverticulitis with abscess formation. 2. Abscess extends along the left pericolic gutter to involve the left psoas and iliacus muscle. 3. No evidence of active leak of oral  contrast. 4. No significant intraperitoneal free air. Findings conveyed toJenniffer Pipenbrink, [place] 04/25/2013  at21:55.   Electronically Signed   By: Suzy Bouchard M.D.   On: 04/25/2013 21:56     MDM  9:57 PM CT scan results called back to me. Informed patient of results. IV anitbiotics ordered. Surgery consulted.    Patient is a 53 year old female past medical history significant for diverticulitis presented for 5 days of worsening sharp stabbing left lower quadrant abdominal pain with radiation into right side with associated nausea. No emesis or diarrhea. Last bowel movement was 3 days ago. Positive flatus. Afebrile, mildly tachycardic, vitals otherwise normal. Mild leukocytosis of 13.3 noted. CT abdomen and pelvis results showed abscess formation along the ascending colon consistent with ruptured diverticulitis with extension of abscess into left pericolic gutter involving left psoas and iliac is muscle. No significant intraperitoneal free air noted. Femoral surgery will be consult with an IV antibiotics will be given.   Dr. Donne Hazel will admit patient to his service.   Harlow Mares, PA-C 04/25/13 2328

## 2013-04-25 NOTE — H&P (Signed)
Tracey Fields is an 53 y.o. female.   Chief Complaint: abdominal pain, consult from er Dr Ashok Cordia HPI: 71 yof who has had several outpatient episodes of diverticulitis treated with oral abx the last she thinks a year ago.  Since Monday she has had llq pain that has been worsening and not responsive to any therapies at home.  She has not been eating.  Has been taking some liquids. Having some bms and passing flatus.  No fevers.  She began taking cipro and flagyl at recommendation of Dr Marla Roe office but that has not helped this time.  She came to er and underwent ct that shows diverticular abscess.  Past Medical History  Diagnosis Date  . Abdominal pain, left lower quadrant 02/24/2010  . ANXIETY 09/21/2006  . BACK PAIN 01/12/2009  . COUGH, CHRONIC 09/21/2006  . DEPRESSION 12/29/2007  . DIVERTICULOSIS, COLON 12/29/2007  . DYSPNEA 06/02/2008  . FATIGUE 07/14/2008  . HEMORRHOIDS, HX OF 09/21/2006  . HOARSENESS, CHRONIC 08/13/2008  . HYPERTENSION 12/29/2007  . Irritable bowel syndrome 09/21/2006  . 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 SORE THROAT 07/14/2008  . Wheezing 05/18/2009  . Impaired glucose tolerance 10/19/2010  . Chronic pancreatitis 07/29/2012    Past Surgical History  Procedure Laterality Date  . Fractured jaw  1980    required wiring  . Colonoscopy    . Upper gastrointestinal endoscopy      Family History  Problem Relation Age of Onset  . Lung cancer Father   . Diabetes Maternal Grandfather   . Colon polyps Other   . Hypertension Mother   . Prostate cancer Father    Social History:  reports that she has been smoking Cigarettes.  She has been smoking about 1.00 pack per day. She has never used smokeless tobacco. She reports that she drinks alcohol. She reports that she does not use illicit drugs.  Allergies:  Allergies  Allergen Reactions  . Anesthetics, Amide Other (See Comments)    System shut down when 53 years old having jaw work done  . Succinylcholine Chloride  Other (See Comments)    Major organs stopped working   Meds reviewed  Results for orders placed during the hospital encounter of 04/25/13 (from the past 48 hour(s))  CBC WITH DIFFERENTIAL     Status: Abnormal   Collection Time    04/25/13  3:57 PM      Result Value Ref Range   WBC 13.3 (*) 4.0 - 10.5 K/uL   RBC 4.20  3.87 - 5.11 MIL/uL   Hemoglobin 13.2  12.0 - 15.0 g/dL   HCT 36.3  36.0 - 46.0 %   MCV 86.4  78.0 - 100.0 fL   MCH 31.4  26.0 - 34.0 pg   MCHC 36.4 (*) 30.0 - 36.0 g/dL   RDW 13.6  11.5 - 15.5 %   Platelets 331  150 - 400 K/uL   Neutrophils Relative % 83 (*) 43 - 77 %   Neutro Abs 11.1 (*) 1.7 - 7.7 K/uL   Lymphocytes Relative 8 (*) 12 - 46 %   Lymphs Abs 1.1  0.7 - 4.0 K/uL   Monocytes Relative 9  3 - 12 %   Monocytes Absolute 1.1 (*) 0.1 - 1.0 K/uL   Eosinophils Relative 0  0 - 5 %   Eosinophils Absolute 0.0  0.0 - 0.7 K/uL   Basophils Relative 0  0 - 1 %   Basophils Absolute 0.0  0.0 - 0.1 K/uL  COMPREHENSIVE METABOLIC  PANEL     Status: Abnormal   Collection Time    04/25/13  3:57 PM      Result Value Ref Range   Sodium 137  137 - 147 mEq/L   Potassium 3.5 (*) 3.7 - 5.3 mEq/L   Chloride 97  96 - 112 mEq/L   CO2 23  19 - 32 mEq/L   Glucose, Bld 129 (*) 70 - 99 mg/dL   BUN 5 (*) 6 - 23 mg/dL   Creatinine, Ser 0.71  0.50 - 1.10 mg/dL   Calcium 9.3  8.4 - 10.5 mg/dL   Total Protein 7.9  6.0 - 8.3 g/dL   Albumin 3.6  3.5 - 5.2 g/dL   AST 15  0 - 37 U/L   ALT 15  0 - 35 U/L   Alkaline Phosphatase 122 (*) 39 - 117 U/L   Total Bilirubin 0.3  0.3 - 1.2 mg/dL   GFR calc non Af Amer >90  >90 mL/min   GFR calc Af Amer >90  >90 mL/min   Comment: (NOTE)     The eGFR has been calculated using the CKD EPI equation.     This calculation has not been validated in all clinical situations.     eGFR's persistently <90 mL/min signify possible Chronic Kidney     Disease.  LIPASE, BLOOD     Status: None   Collection Time    04/25/13  3:57 PM      Result Value Ref  Range   Lipase 18  11 - 59 U/L  URINALYSIS, ROUTINE W REFLEX MICROSCOPIC     Status: Abnormal   Collection Time    04/25/13  3:58 PM      Result Value Ref Range   Color, Urine YELLOW  YELLOW   APPearance CLEAR  CLEAR   Specific Gravity, Urine 1.018  1.005 - 1.030   pH 5.5  5.0 - 8.0   Glucose, UA NEGATIVE  NEGATIVE mg/dL   Hgb urine dipstick SMALL (*) NEGATIVE   Bilirubin Urine NEGATIVE  NEGATIVE   Ketones, ur NEGATIVE  NEGATIVE mg/dL   Protein, ur 100 (*) NEGATIVE mg/dL   Urobilinogen, UA 0.2  0.0 - 1.0 mg/dL   Nitrite NEGATIVE  NEGATIVE   Leukocytes, UA TRACE (*) NEGATIVE  URINE MICROSCOPIC-ADD ON     Status: Abnormal   Collection Time    04/25/13  3:58 PM      Result Value Ref Range   Squamous Epithelial / LPF FEW (*) RARE   WBC, UA 0-2  <3 WBC/hpf   RBC / HPF 3-6  <3 RBC/hpf   Bacteria, UA RARE  RARE  PREGNANCY, URINE     Status: None   Collection Time    04/25/13  3:58 PM      Result Value Ref Range   Preg Test, Ur NEGATIVE  NEGATIVE   Comment:            THE SENSITIVITY OF THIS     METHODOLOGY IS >20 mIU/mL.   Ct Abdomen Pelvis W Contrast  04/25/2013   CLINICAL DATA:  Left lower quadrant abdominal pain 5 days  EXAM: CT ABDOMEN AND PELVIS WITH CONTRAST  TECHNIQUE: Multidetector CT imaging of the abdomen and pelvis was performed using the standard protocol following bolus administration of intravenous contrast.  CONTRAST:  84m OMNIPAQUE IOHEXOL 300 MG/ML  SOLN  COMPARISON:  CT ABD/PELVIS W CM dated 07/30/2012  FINDINGS: Lung bases are clear.  No pericardial fluid.  No  focal hepatic lesion. The gallbladder, pancreas, spleen, adrenal glands, kidneys demonstrate no acute findings. There is calcifications of the pancreatic head similar prior consistent with chronic pancreatitis.  The stomach, small bowel, and cecum normal. There is a fluid collection along the posterior wall of the descending colon measuring 3.7 x 3.5 cm. This has a thin enhancing rim and extends inferiorly along  the pericolic gutter. This finding is most consistent with ruptured diverticulitis with abscess formation. The abscess extends to involve the left psoas muscle and iliacus muscle to a minimal degree (image 50 series 2). No evidence of bowel obstruction. No evidence of frank leak of the oral contrast. Contrast flows entirety of the colon to the rectum.  Abdominal or is normal caliber. No retroperitoneal periportal lymphadenopathy.  No free fluid the pelvis. Uterus and ovaries are normal. No pelvic lymphadenopathy. No aggressive osseous lesion.  IMPRESSION: 1. Abscess along the descending colon is most consistent with ruptured diverticulitis with abscess formation. 2. Abscess extends along the left pericolic gutter to involve the left psoas and iliacus muscle. 3. No evidence of active leak of oral contrast. 4. No significant intraperitoneal free air. Findings conveyed toJenniffer Pipenbrink, [place] 04/25/2013  at21:55.   Electronically Signed   By: Suzy Bouchard M.D.   On: 04/25/2013 21:56    Review of Systems  Constitutional: Negative for fever and chills.  Respiratory: Negative for shortness of breath.   Cardiovascular: Negative for chest pain.  Gastrointestinal: Positive for abdominal pain. Negative for nausea and vomiting.  Genitourinary: Negative for dysuria, urgency and frequency.    Blood pressure 110/55, pulse 110, temperature 98.9 F (37.2 C), temperature source Oral, resp. rate 18, SpO2 95.00%. Physical Exam  Vitals reviewed. Constitutional: She is oriented to person, place, and time. She appears well-developed and well-nourished.  Eyes: No scleral icterus.  Neck: Neck supple.  Cardiovascular: Normal rate, regular rhythm and normal heart sounds.   Respiratory: Effort normal and breath sounds normal. She has no wheezes. She has no rales.  GI: Soft. Bowel sounds are normal. She exhibits no distension. There is tenderness in the left lower quadrant. No hernia.  Lymphadenopathy:    She has  no cervical adenopathy.  Neurological: She is alert and oriented to person, place, and time.     Assessment/Plan Diverticular abscess  She was due to get another csc next month. Last was when she was 40 she states.  This does appear to be diverticular abscess.  Will admit, make npo, zosyn, recheck labs in am, check pt/inr and see if IR can drain abscess tomorrow. We discussed if she does not improve or worsens she may need surgery this admission. Hopefully this will resolve and then can have a conversation about elective colectomy at later date.   Monia Timmers 04/25/2013, 11:49 PM

## 2013-04-25 NOTE — ED Notes (Signed)
PA at bedside.

## 2013-04-25 NOTE — ED Notes (Signed)
Pt completed oral contrast. CT notified

## 2013-04-25 NOTE — ED Provider Notes (Signed)
CSN: [id number]     Arrival date & time 04/25/13  1535 History   First MD Initiated Contact with Patient 04/25/13 1825     Chief Complaint  Patient presents with  . Abdominal Pain     (Consider location/radiation/quality/duration/timing/severity/associated sxs/prior Treatment) HPI Comments: Tracey Fields is a 53 y.o. female with a past medical history of Diverticulitis, HTN, Chronic pancreatitits presenting the Emergency Department with a chief complaint of worsening LLQ discomfort for 5 days. She reports last non-blood, no pus noted, BM was approximately 3 days ago. She reports taking Ciprofloxicin and Flagyl , prescribed by Dr. Carlean Purl, without relief of symptoms.  She reports associated nausea without emesis and decrease in appetitive. She reports similar discomfort with diverticulitis.  Aggravating factors include walking and coughing. Denies fever or chills. LNMP 4 months ago.   The history is provided by the patient and medical records. No language interpreter was used.    Past Medical History  Diagnosis Date  . Abdominal pain, left lower quadrant 02/24/2010  . ANXIETY 09/21/2006  . BACK PAIN 01/12/2009  . COUGH, CHRONIC 09/21/2006  . DEPRESSION 12/29/2007  . DIVERTICULOSIS, COLON 12/29/2007  . DYSPNEA 06/02/2008  . FATIGUE 07/14/2008  . HEMORRHOIDS, HX OF 09/21/2006  . HOARSENESS, CHRONIC 08/13/2008  . HYPERTENSION 12/29/2007  . Irritable bowel syndrome 09/21/2006  . 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 SORE THROAT 07/14/2008  . Wheezing 05/18/2009  . Impaired glucose tolerance 10/19/2010  . Chronic pancreatitis 07/29/2012   Past Surgical History  Procedure Laterality Date  . Fractured jaw  1980    required wiring  . Colonoscopy    . Upper gastrointestinal endoscopy     Family History  Problem Relation Age of Onset  . Lung cancer Father   . Diabetes Maternal Grandfather   . Colon polyps Other   . Hypertension Mother   . Prostate cancer Father    History    Substance Use Topics  . Smoking status: Current Every Day Smoker -- 1.00 packs/day    Types: Cigarettes  . Smokeless tobacco: Never Used     Comment: 1 ppd, patient has been counseled to quit smoking 08/13/08  . Alcohol Use: Yes     Comment: social   OB History   Grav Para Term Preterm Abortions TAB SAB Ect Mult Living   2 2        2      Review of Systems  Constitutional: Positive for appetite change. Negative for fever and chills.  Gastrointestinal: Positive for nausea, abdominal pain and constipation. Negative for vomiting, diarrhea and blood in stool.  Genitourinary: Positive for flank pain. Negative for dysuria and hematuria.      Allergies  Anesthetics, amide and Succinylcholine chloride  Home Medications   Current Outpatient Rx  Name  Route  Sig  Dispense  Refill  . acetaminophen (TYLENOL) 500 MG tablet   Oral   Take 1,000 mg by mouth 2 (two) times daily as needed (pain).         Marland Kitchen ALPRAZolam (XANAX) 0.5 MG tablet   Oral   Take 0.25 mg by mouth 3 (three) times daily as needed for anxiety.          Marland Kitchen aspirin EC 81 MG tablet   Oral   Take 81 mg by mouth at bedtime.          Marland Kitchen b complex vitamins capsule   Oral   Take 1 capsule by mouth at bedtime.          Marland Kitchen  cetirizine (ZYRTEC) 10 MG tablet   Oral   Take 5 mg by mouth at bedtime.          . ciprofloxacin (CIPRO) 500 MG tablet   Oral   Take 500 mg by mouth 2 (two) times daily. 10 day course started 04/23/13         . desvenlafaxine (PRISTIQ) 100 MG 24 hr tablet   Oral   Take 200 mg by mouth daily.         Marland Kitchen ibuprofen (ADVIL,MOTRIN) 200 MG tablet   Oral   Take 400 mg by mouth 3 (three) times daily as needed (pain).         . metroNIDAZOLE (FLAGYL) 500 MG tablet   Oral   Take 500 mg by mouth 2 (two) times daily. 10 day course started 04/23/13         . naproxen sodium (ALEVE) 220 MG tablet   Oral   Take 440 mg by mouth daily as needed (pain).         . Pancrelipase, Lip-Prot-Amyl,  (CREON) 36000 UNITS CPEP   Oral   Take 36,000 Units by mouth 3 (three) times daily.         . propranolol (INDERAL) 10 MG tablet   Oral   Take 10 mg by mouth daily.         Marland Kitchen zolpidem (AMBIEN) 10 MG tablet   Oral   Take 5 mg by mouth at bedtime as needed for sleep.          BP 118/93  Pulse 100  Temp(Src) 98.9 F (37.2 C) (Oral)  Resp 18  SpO2 100% Physical Exam  Nursing note and vitals reviewed. Constitutional: She is oriented to person, place, and time. She appears well-developed and well-nourished. No distress.  Appears uncomfortable  HENT:  Head: Normocephalic and atraumatic.  Eyes: EOM are normal. Pupils are equal, round, and reactive to light. No scleral icterus.  Neck: Neck supple.  Cardiovascular: Regular rhythm and normal heart sounds.  Tachycardia present.   No murmur heard. Pulmonary/Chest: Effort normal and breath sounds normal. She has no wheezes.  Abdominal: Soft. Normal appearance. Bowel sounds are decreased. There is tenderness in the left upper quadrant and left lower quadrant. There is guarding. There is no rebound and no CVA tenderness.    Musculoskeletal: Normal range of motion. She exhibits no edema.  Neurological: She is alert and oriented to person, place, and time.  Skin: Skin is warm and dry. No rash noted.  Psychiatric: She has a normal mood and affect. Her behavior is normal.    ED Course  Procedures (including critical care time) Labs Review Labs Reviewed  CBC WITH DIFFERENTIAL - Abnormal; Notable for the following:    WBC 13.3 (*)    MCHC 36.4 (*)    Neutrophils Relative % 83 (*)    Neutro Abs 11.1 (*)    Lymphocytes Relative 8 (*)    Monocytes Absolute 1.1 (*)    All other components within normal limits  COMPREHENSIVE METABOLIC PANEL - Abnormal; Notable for the following:    Potassium 3.5 (*)    Glucose, Bld 129 (*)    BUN 5 (*)    Alkaline Phosphatase 122 (*)    All other components within normal limits  URINALYSIS,  ROUTINE W REFLEX MICROSCOPIC - Abnormal; Notable for the following:    Hgb urine dipstick SMALL (*)    Protein, ur 100 (*)    Leukocytes, UA TRACE (*)  All other components within normal limits  URINE MICROSCOPIC-ADD ON - Abnormal; Notable for the following:    Squamous Epithelial / LPF FEW (*)    All other components within normal limits  LIPASE, BLOOD   Imaging Review No results found.   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 Labs and imaging ordered. Fluids and pain and antinausea medication orderd CBC shows leucocytosis at 13.3. UA shows Hgb, and trace leukocytes, pt without urinary symptoms and denies history of Kidney stone. Pt care assumed by Baron Sane, PA-C, will follow up on CT results and treatment of the patient.     Lorrine Kin, PA-C 04/27/13 1319

## 2013-04-25 NOTE — ED Notes (Signed)
Pt reports LLQ abdominal pain x 5 days. States that she took some antibiotics that her MD prescribed on last visit if she had abdominal pain. Pt states that she thinks she may have diverticulitis. C/O nausea with emesis or diarrhea. States she has been taking tylenol for the pain with no relief.

## 2013-04-26 ENCOUNTER — Inpatient Hospital Stay (HOSPITAL_COMMUNITY): Payer: Medicaid Other

## 2013-04-26 ENCOUNTER — Encounter (HOSPITAL_COMMUNITY): Payer: Self-pay | Admitting: Emergency Medicine

## 2013-04-26 LAB — CBC
HCT: 34.9 % — ABNORMAL LOW (ref 36.0–46.0)
Hemoglobin: 12.2 g/dL (ref 12.0–15.0)
MCH: 30.6 pg (ref 26.0–34.0)
MCHC: 35 g/dL (ref 30.0–36.0)
MCV: 87.5 fL (ref 78.0–100.0)
Platelets: 295 10*3/uL (ref 150–400)
RBC: 3.99 MIL/uL (ref 3.87–5.11)
RDW: 13.9 % (ref 11.5–15.5)
WBC: 11.7 10*3/uL — AB (ref 4.0–10.5)

## 2013-04-26 LAB — BASIC METABOLIC PANEL
BUN: 5 mg/dL — ABNORMAL LOW (ref 6–23)
CALCIUM: 9.3 mg/dL (ref 8.4–10.5)
CO2: 24 meq/L (ref 19–32)
Chloride: 101 mEq/L (ref 96–112)
Creatinine, Ser: 0.66 mg/dL (ref 0.50–1.10)
GFR calc Af Amer: >90 mL/min (ref >90)
Glucose, Bld: 88 mg/dL (ref 70–99)
POTASSIUM: 3.2 meq/L — AB (ref 3.7–5.3)
SODIUM: 142 meq/L (ref 137–147)

## 2013-04-26 LAB — PROTIME-INR
INR: 1.09 (ref 0.00–1.49)
PROTHROMBIN TIME: 13.9 s (ref 11.6–15.2)

## 2013-04-26 MED ORDER — FENTANYL CITRATE 0.05 MG/ML IJ SOLN
INTRAMUSCULAR | Status: AC
  Filled 2013-04-26: qty 4

## 2013-04-26 MED ORDER — SODIUM CHLORIDE 0.9 % IV SOLN
INTRAVENOUS | Status: DC
  Administered 2013-04-26: 100 mL/h via INTRAVENOUS

## 2013-04-26 MED ORDER — FENTANYL CITRATE 0.05 MG/ML IJ SOLN
INTRAMUSCULAR | Status: AC | PRN
  Administered 2013-04-26 (×2): 50 ug via INTRAVENOUS

## 2013-04-26 MED ORDER — PANTOPRAZOLE SODIUM 40 MG IV SOLR
40.0000 mg | Freq: Every day | INTRAVENOUS | Status: DC
  Administered 2013-04-26 – 2013-04-29 (×5): 40 mg via INTRAVENOUS
  Filled 2013-04-26 (×9): qty 40

## 2013-04-26 MED ORDER — HYDROMORPHONE HCL PF 1 MG/ML IJ SOLN
1.0000 mg | INTRAMUSCULAR | Status: DC | PRN
  Administered 2013-04-26 (×7): 1 mg via INTRAVENOUS
  Filled 2013-04-26 (×7): qty 1

## 2013-04-26 MED ORDER — PIPERACILLIN-TAZOBACTAM 3.375 G IVPB
3.3750 g | Freq: Three times a day (TID) | INTRAVENOUS | Status: DC
  Administered 2013-04-26 – 2013-05-01 (×16): 3.375 g via INTRAVENOUS
  Filled 2013-04-26 (×18): qty 50

## 2013-04-26 MED ORDER — HYDROMORPHONE HCL PF 1 MG/ML IJ SOLN
1.0000 mg | INTRAMUSCULAR | Status: DC | PRN
  Administered 2013-04-26 – 2013-04-27 (×6): 2 mg via INTRAVENOUS
  Administered 2013-04-28: 1 mg via INTRAVENOUS
  Administered 2013-04-28: 2 mg via INTRAVENOUS
  Administered 2013-04-28 – 2013-04-30 (×8): 1 mg via INTRAVENOUS
  Filled 2013-04-26: qty 1
  Filled 2013-04-26: qty 2
  Filled 2013-04-26: qty 1
  Filled 2013-04-26: qty 2
  Filled 2013-04-26 (×6): qty 1
  Filled 2013-04-26 (×6): qty 2
  Filled 2013-04-26: qty 1

## 2013-04-26 MED ORDER — MIDAZOLAM HCL 2 MG/2ML IJ SOLN
INTRAMUSCULAR | Status: AC
  Filled 2013-04-26: qty 4

## 2013-04-26 MED ORDER — CHLORHEXIDINE GLUCONATE 0.12 % MT SOLN
15.0000 mL | Freq: Two times a day (BID) | OROMUCOSAL | Status: DC
  Administered 2013-04-26 (×2): 15 mL via OROMUCOSAL
  Filled 2013-04-26 (×2): qty 15

## 2013-04-26 MED ORDER — ONDANSETRON HCL 4 MG/2ML IJ SOLN: 4.0000 mg | Freq: Four times a day (QID) | INTRAMUSCULAR | Status: DC | PRN

## 2013-04-26 MED ORDER — LIDOCAINE HCL 1 % IJ SOLN
INTRAMUSCULAR | Status: AC
  Filled 2013-04-26: qty 10

## 2013-04-26 MED ORDER — SODIUM CHLORIDE 0.9 % IV SOLN
INTRAVENOUS | Status: DC
  Administered 2013-04-26 – 2013-04-29 (×6): via INTRAVENOUS
  Filled 2013-04-26 (×7): qty 1000

## 2013-04-26 MED ORDER — MIDAZOLAM HCL 2 MG/2ML IJ SOLN
INTRAMUSCULAR | Status: AC | PRN
  Administered 2013-04-26 (×2): 1 mg via INTRAVENOUS
  Administered 2013-04-26: 2 mg via INTRAVENOUS

## 2013-04-26 NOTE — Progress Notes (Signed)
Subjective: Drainage of diverticular abscess performed by or this morning, and she already feels better. Pain is less. Drainage is purulent. No nausea or vomiting.  Afebrile. WBC 11,700. Hemoglobin 12.2. Potassium 3.2.  Objective: Vital signs in last 24 hours: Temp:  [98.2 F (36.8 C)-99 F (37.2 C)] 98.3 F (36.8 C) (03/22 1040) Pulse Rate:  [94-110] 100 (03/22 1040) Resp:  [10-20] 16 (03/22 1040) BP: (95-119)/(51-93) 112/73 mmHg (03/22 1040) SpO2:  [94 %-100 %] 94 % (03/22 1040) Weight:  [137 lb 12.6 oz (62.5 kg)] 137 lb 12.6 oz (62.5 kg) (03/22 0046) Last BM Date: 04/25/13  Intake/Output from previous day:   Intake/Output this shift:    General appearance: alert. Pleasant. Minimal distress. Mental status normal. Family in room. Resp: clear to auscultation bilaterally GI: Soft. Nondistended. Mild tenderness left lower quadrant. No mass.  Lab Results:   Recent Labs  04/25/13 1557 04/26/13 0625  WBC 13.3* 11.7*  HGB 13.2 12.2  HCT 36.3 34.9*  PLT 331 295   BMET  Recent Labs  04/25/13 1557 04/26/13 0625  NA 137 142  K 3.5* 3.2*  CL 97 101  CO2 23 24  GLUCOSE 129* 88  BUN 5* 5*  CREATININE 0.71 0.66  CALCIUM 9.3 9.3   PT/INR  Recent Labs  04/26/13 0625  LABPROT 13.9  INR 1.09   ABG No results found for this basename: PHART, PCO2, PO2, HCO3,  in the last 72 hours  Studies/Results: Ct Abdomen Pelvis W Contrast  04/25/2013   CLINICAL DATA:  Left lower quadrant abdominal pain 5 days  EXAM: CT ABDOMEN AND PELVIS WITH CONTRAST  TECHNIQUE: Multidetector CT imaging of the abdomen and pelvis was performed using the standard protocol following bolus administration of intravenous contrast.  CONTRAST:  22mL OMNIPAQUE IOHEXOL 300 MG/ML  SOLN  COMPARISON:  CT ABD/PELVIS W CM dated 07/30/2012  FINDINGS: Lung bases are clear.  No pericardial fluid.  No focal hepatic lesion. The gallbladder, pancreas, spleen, adrenal glands, kidneys demonstrate no acute findings.  There is calcifications of the pancreatic head similar prior consistent with chronic pancreatitis.  The stomach, small bowel, and cecum normal. There is a fluid collection along the posterior wall of the descending colon measuring 3.7 x 3.5 cm. This has a thin enhancing rim and extends inferiorly along the pericolic gutter. This finding is most consistent with ruptured diverticulitis with abscess formation. The abscess extends to involve the left psoas muscle and iliacus muscle to a minimal degree (image 50 series 2). No evidence of bowel obstruction. No evidence of frank leak of the oral contrast. Contrast flows entirety of the colon to the rectum.  Abdominal or is normal caliber. No retroperitoneal periportal lymphadenopathy.  No free fluid the pelvis. Uterus and ovaries are normal. No pelvic lymphadenopathy. No aggressive osseous lesion.  IMPRESSION: 1. Abscess along the descending colon is most consistent with ruptured diverticulitis with abscess formation. 2. Abscess extends along the left pericolic gutter to involve the left psoas and iliacus muscle. 3. No evidence of active leak of oral contrast. 4. No significant intraperitoneal free air. Findings conveyed toJenniffer Pipenbrink, [place] 04/25/2013  at21:55.   Electronically Signed   By: Suzy Bouchard M.D.   On: 04/25/2013 21:56   Ct Image Guided Drainage By Percutaneous Catheter  04/26/2013   CLINICAL DATA:  Diverticular abscess in the left lower quadrant  EXAM: CT IMAGE GUIDED DRAINAGE BY PERCUTANEOUS CATHETER  FLUOROSCOPY TIME:  None  MEDICATIONS AND MEDICAL HISTORY: Versed four mg, Fentanyl 100 mcg.  Additional Medications: None.  ANESTHESIA/SEDATION: Moderate sedation time: 40 minutes  CONTRAST:  None.  PROCEDURE: The procedure, risks, benefits, and alternatives were explained to the patient. Questions regarding the procedure were encouraged and answered. The patient understands and consents to the procedure.  The left lower quadrant was prepped with  Betadine in a sterile fashion, and a sterile drape was applied covering the operative field. A sterile gown and sterile gloves were used for the procedure.  1% lidocaine was utilized for local anesthesia. Under CT guidance, an 18 gauge needle was inserted into the left lower quadrant abscess posterior to the descending colon. It was removed over an Amplatz wire. A 12 French dilator followed by a 12 [place] drain were inserted. It was looped and string fixed and sewn to the skin. Frank pus was aspirated  FINDINGS: Images demonstrate 97 French drain placement into a diverticular abscess. 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  COMPLICATIONS: None  IMPRESSION: Successful left lower quadrant abscess drain.   Electronically Signed   By: Maryclare Bean M.D.   On: 04/26/2013 10:28    Anti-infectives: Anti-infectives   Start     Dose/Rate Route Frequency Ordered Stop   04/26/13 0400  piperacillin-tazobactam (ZOSYN) IVPB 3.375 g     3.375 g 12.5 mL/hr over 240 Minutes Intravenous 3 times per day 04/26/13 0057     04/25/13 2215  vancomycin (VANCOCIN) IVPB 1000 mg/200 mL premix  Status:  Discontinued     1,000 mg 200 mL/hr over 60 Minutes Intravenous  Once 04/25/13 2208 04/25/13 2237   04/25/13 2215  piperacillin-tazobactam (ZOSYN) IVPB 3.375 g     3.375 g 100 mL/hr over 30 Minutes Intravenous  Once 04/25/13 2208 04/26/13 0013      Assessment/Plan:  Recurrent diverticulitis and diverticular abscess Doing well immediately following percutaneous drainage of abscess Mobilize out of bed Clear liquid diet Continue antibiotics I did discuss the possible ways that this might be managed. Hopefully this episode was all allowing discharge home and evaluation as an outpatient. Told her if we did surgery now she would require a colostomy. At a later date, we might be able to do an elective operation with  single-stage.    LOS: 1 day    [name] 04/26/2013

## 2013-04-26 NOTE — Progress Notes (Signed)
Pt c/o that pain medication "not lasting long enough" call to MD, see new orders

## 2013-04-26 NOTE — Procedures (Signed)
LLQ abscess drain 12 Fr No comp

## 2013-04-26 NOTE — ED Provider Notes (Signed)
Medical screening examination/treatment/procedure(s) were performed by non-physician practitioner and as supervising physician I was immediately available for consultation/collaboration.   EKG Interpretation None        Mirna Mires, MD 04/26/13 1810

## 2013-04-26 NOTE — Progress Notes (Signed)
ANTIBIOTIC CONSULT NOTE - INITIAL  Pharmacy Consult for Zosyn  Indication: Diverticular Abscess  Allergies  Allergen Reactions  . Anesthetics, Amide Other (See Comments)    System shut down when 53 years old having jaw work done  . Succinylcholine Chloride Other (See Comments)    Major organs stopped working    Patient Measurements: Height: 5\' 6"  (167.6 cm) Weight: 137 lb 12.6 oz (62.5 kg) IBW/kg (Calculated) : 59.3  Vital Signs: Temp: 98.5 F (36.9 C) (03/22 0046) Temp src: Oral (03/22 0046) BP: 100/59 mmHg (03/22 0046) Pulse Rate: 99 (03/22 0046) Labs:  Recent Labs  04/25/13 1557  WBC 13.3*  HGB 13.2  PLT 331  CREATININE 0.71   Estimated Creatinine Clearance: 77 ml/min (by C-G formula based on Cr of 0.71).  Medical History: Past Medical History  Diagnosis Date  . Abdominal pain, left lower quadrant 02/24/2010  . ANXIETY 09/21/2006  . BACK PAIN 01/12/2009  . COUGH, CHRONIC 09/21/2006  . DEPRESSION 12/29/2007  . DIVERTICULOSIS, COLON 12/29/2007  . DYSPNEA 06/02/2008  . FATIGUE 07/14/2008  . HEMORRHOIDS, HX OF 09/21/2006  . HOARSENESS, CHRONIC 08/13/2008  . HYPERTENSION 12/29/2007  . Irritable bowel syndrome 09/21/2006  . 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 SORE THROAT 07/14/2008  . Wheezing 05/18/2009  . Impaired glucose tolerance 10/19/2010  . Chronic pancreatitis 07/29/2012   [name], renal function ok, other labs as above.   Goal of Therapy:  Clinical resolution   Plan:  -Zosyn 3.375G IV q8h to be infused over 4 hours -Trend WBC, temp, renal function   Tracey Fields 04/26/2013,12:54 AM

## 2013-04-27 DIAGNOSIS — E876 Hypokalemia: Secondary | ICD-10-CM

## 2013-04-27 LAB — CBC
HEMATOCRIT: 34.7 % — AB (ref 36.0–46.0)
Hemoglobin: 12.1 g/dL (ref 12.0–15.0)
MCH: 30.5 pg (ref 26.0–34.0)
MCHC: 34.9 g/dL (ref 30.0–36.0)
MCV: 87.4 fL (ref 78.0–100.0)
Platelets: 336 10*3/uL (ref 150–400)
RBC: 3.97 MIL/uL (ref 3.87–5.11)
RDW: 13.8 % (ref 11.5–15.5)
WBC: 7.9 10*3/uL (ref 4.0–10.5)

## 2013-04-27 LAB — BASIC METABOLIC PANEL
BUN: 3 mg/dL — ABNORMAL LOW (ref 6–23)
CHLORIDE: 99 meq/L (ref 96–112)
CO2: 27 meq/L (ref 19–32)
CREATININE: 0.63 mg/dL (ref 0.50–1.10)
Calcium: 9 mg/dL (ref 8.4–10.5)
GFR calc non Af Amer: >90 mL/min (ref >90)
Glucose, Bld: 132 mg/dL — ABNORMAL HIGH (ref 70–99)
Potassium: 3.2 mEq/L — ABNORMAL LOW (ref 3.7–5.3)
Sodium: 140 mEq/L (ref 137–147)

## 2013-04-27 MED ORDER — LORATADINE 10 MG PO TABS
10.0000 mg | ORAL_TABLET | Freq: Every day | ORAL | Status: DC
  Administered 2013-04-27 – 2013-05-01 (×5): 10 mg via ORAL
  Filled 2013-04-27 (×7): qty 1

## 2013-04-27 MED ORDER — PANCRELIPASE (LIP-PROT-AMYL) 36000-114000 UNITS PO CPEP: 36000.0000 [IU] | ORAL_CAPSULE | Freq: Three times a day (TID) | ORAL | Status: DC

## 2013-04-27 MED ORDER — ZOLPIDEM TARTRATE 5 MG PO TABS
5.0000 mg | ORAL_TABLET | Freq: Every evening | ORAL | Status: DC | PRN
  Administered 2013-04-28 – 2013-04-30 (×4): 5 mg via ORAL
  Filled 2013-04-27 (×4): qty 1

## 2013-04-27 MED ORDER — ENOXAPARIN SODIUM 40 MG/0.4ML ~~LOC~~ SOLN
40.0000 mg | SUBCUTANEOUS | Status: DC
  Administered 2013-04-27 – 2013-05-01 (×5): 40 mg via SUBCUTANEOUS
  Filled 2013-04-27 (×7): qty 0.4

## 2013-04-27 MED ORDER — PANCRELIPASE (LIP-PROT-AMYL) 12000-38000 UNITS PO CPEP
3.0000 | ORAL_CAPSULE | Freq: Three times a day (TID) | ORAL | Status: DC
  Administered 2013-04-27 – 2013-05-01 (×13): 3 via ORAL
  Filled 2013-04-27 (×15): qty 3

## 2013-04-27 MED ORDER — DESVENLAFAXINE SUCCINATE ER 100 MG PO TB24
200.0000 mg | ORAL_TABLET | Freq: Every day | ORAL | Status: DC
  Administered 2013-04-27 – 2013-05-01 (×5): 200 mg via ORAL
  Filled 2013-04-27 (×5): qty 2

## 2013-04-27 MED ORDER — POTASSIUM CHLORIDE CRYS ER 20 MEQ PO TBCR
40.0000 meq | EXTENDED_RELEASE_TABLET | Freq: Two times a day (BID) | ORAL | Status: AC
  Administered 2013-04-27 (×2): 40 meq via ORAL
  Filled 2013-04-27 (×3): qty 2

## 2013-04-27 MED ORDER — OXYCODONE-ACETAMINOPHEN 5-325 MG PO TABS
1.0000 | ORAL_TABLET | ORAL | Status: DC | PRN
  Administered 2013-04-27: 1 via ORAL
  Administered 2013-04-27 – 2013-04-28 (×4): 2 via ORAL
  Administered 2013-04-28: 1 via ORAL
  Administered 2013-04-28 – 2013-05-01 (×16): 2 via ORAL
  Filled 2013-04-27 (×22): qty 2

## 2013-04-27 MED ORDER — ALPRAZOLAM 0.25 MG PO TABS: 0.2500 mg | ORAL_TABLET | Freq: Three times a day (TID) | ORAL | Status: DC | PRN

## 2013-04-27 NOTE — ED Provider Notes (Signed)
Medical screening examination/treatment/procedure(s) were performed by non-physician practitioner and as supervising physician I was immediately available for consultation/collaboration.   EKG Interpretation None        Malvin Johns, MD 04/27/13 1904

## 2013-04-27 NOTE — Progress Notes (Signed)
Subjective: LLQ divertic abscess drain placed 3/22 Feels better  Objective: Vital signs in last 24 hours: Temp:  [98.3 F (36.8 C)-98.9 F (37.2 C)] 98.6 F (37 C) (03/23 4098) Pulse Rate:  [73-110] 85 (03/23 0632) Resp:  [10-18] 17 (03/23 1191) BP: (95-112)/(51-82) 108/65 mmHg (03/23 0632) SpO2:  [93 %-97 %] 94 % (03/23 0632) Last BM Date: 04/25/13  Intake/Output from previous day: 03/22 0701 - 03/23 0700 In: 3171.7 [P.O.:840; I.V.:2176.7; IV Piggyback:150] Out: 175 [Drains:175] Intake/Output this shift:    PE:  Afeb; vss Output 175 cc 3/22 15 cc in bag now; +air [place] [employer]ark output Site clean and dry; sl tender Wbc 11.7 yesterday Cx: Gr+ rods   Lab Results:   Recent Labs  04/25/13 1557 04/26/13 0625  WBC 13.3* 11.7*  HGB 13.2 12.2  HCT 36.3 34.9*  PLT 331 295   BMET  Recent Labs  04/25/13 1557 04/26/13 0625  NA 137 142  K 3.5* 3.2*  CL 97 101  CO2 23 24  GLUCOSE 129* 88  BUN 5* 5*  CREATININE 0.71 0.66  CALCIUM 9.3 9.3   PT/INR  Recent Labs  04/26/13 0625  LABPROT 13.9  INR 1.09   ABG No results found for this basename: PHART, PCO2, PO2, HCO3,  in the last 72 hours  Studies/Results: Ct Abdomen Pelvis W Contrast  04/25/2013   CLINICAL DATA:  Left lower quadrant abdominal pain 5 days  EXAM: CT ABDOMEN AND PELVIS WITH CONTRAST  TECHNIQUE: Multidetector CT imaging of the abdomen and pelvis was performed using the standard protocol following bolus administration of intravenous contrast.  CONTRAST:  58mL OMNIPAQUE IOHEXOL 300 MG/ML  SOLN  COMPARISON:  CT ABD/PELVIS W CM dated 07/30/2012  FINDINGS: Lung bases are clear.  No pericardial fluid.  No focal hepatic lesion. The gallbladder, pancreas, spleen, adrenal glands, kidneys demonstrate no acute findings. There is calcifications of the pancreatic head similar prior consistent with chronic pancreatitis.  The stomach, small bowel, and cecum normal. There is a fluid collection along the posterior wall of  the descending colon measuring 3.7 x 3.5 cm. This has a thin enhancing rim and extends inferiorly along the pericolic gutter. This finding is most consistent with ruptured diverticulitis with abscess formation. The abscess extends to involve the left psoas muscle and iliacus muscle to a minimal degree (image 50 series 2). No evidence of bowel obstruction. No evidence of frank leak of the oral contrast. Contrast flows entirety of the colon to the rectum.  Abdominal or is normal caliber. No retroperitoneal periportal lymphadenopathy.  No free fluid the pelvis. Uterus and ovaries are normal. No pelvic lymphadenopathy. No aggressive osseous lesion.  IMPRESSION: 1. Abscess along the descending colon is most consistent with ruptured diverticulitis with abscess formation. 2. Abscess extends along the left pericolic gutter to involve the left psoas and iliacus muscle. 3. No evidence of active leak of oral contrast. 4. No significant intraperitoneal free air. Findings conveyed toJenniffer Pipenbrink, [place] 04/25/2013  at21:55.   Electronically Signed   By: Suzy Bouchard M.D.   On: 04/25/2013 21:56   Ct Image Guided Drainage By Percutaneous Catheter  04/26/2013   CLINICAL DATA:  Diverticular abscess in the left lower quadrant  EXAM: CT IMAGE GUIDED DRAINAGE BY PERCUTANEOUS CATHETER  FLUOROSCOPY TIME:  None  MEDICATIONS AND MEDICAL HISTORY: Versed four mg, Fentanyl 100 mcg.  Additional Medications: None.  ANESTHESIA/SEDATION: Moderate sedation time: 40 minutes  CONTRAST:  None.  PROCEDURE: The procedure, risks, benefits, and alternatives were explained to  the patient. Questions regarding the procedure were encouraged and answered. The patient understands and consents to the procedure.  The left lower quadrant was prepped with Betadine in a sterile fashion, and a sterile drape was applied covering the operative field. A sterile gown and sterile gloves were used for the procedure.  1% lidocaine was utilized for local  anesthesia. Under CT guidance, an 18 gauge needle was inserted into the left lower quadrant abscess posterior to the descending colon. It was removed over an Amplatz wire. A 12 French dilator followed by a 12 [place] drain were inserted. It was looped and string fixed and sewn to the skin. Frank pus was aspirated  FINDINGS: Images demonstrate 49 French drain placement into a diverticular abscess. 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  COMPLICATIONS: None  IMPRESSION: Successful left lower quadrant abscess drain.   Electronically Signed   By: Maryclare Bean M.D.   On: 04/26/2013 10:28    Anti-infectives: Anti-infectives   Start     Dose/Rate Route Frequency Ordered Stop   04/26/13 0400  piperacillin-tazobactam (ZOSYN) IVPB 3.375 g     3.375 g 12.5 mL/hr over 240 Minutes Intravenous 3 times per day 04/26/13 0057     04/25/13 2215  vancomycin (VANCOCIN) IVPB 1000 mg/200 mL premix  Status:  Discontinued     1,000 mg 200 mL/hr over 60 Minutes Intravenous  Once 04/25/13 2208 04/25/13 2237   04/25/13 2215  piperacillin-tazobactam (ZOSYN) IVPB 3.375 g     3.375 g 100 mL/hr over 30 Minutes Intravenous  Once 04/25/13 2208 04/26/13 0013      Assessment/Plan: s/p * No surgery found *  LLQ divertic abscess drain placed 3/22 Better today Will follow Plan per [facility]   LOS: 2 days    Cielle Aguila A 04/27/2013

## 2013-04-27 NOTE — Progress Notes (Signed)
Will see how her labs and exam are in the AM. Still tender LLQ. Continue ABX. I spoke with her sister as well regarding the plan of care. Patient examined and I agree with the assessment and plan  Georganna Skeans, MD, MPH, FACS Trauma: [phone] General Surgery: [phone]  04/27/2013 5:53 PM

## 2013-04-27 NOTE — Progress Notes (Signed)
Patient ID: Tracey Fields, female   DOB: 19-Mar-1960, 53 y.o.   MRN: [id number]  Subjective: Pain is better today.  No n/v.  Tolerated clear liquids.  Getting up to [place].  +flatus, small loose BMs.    Objective:  Vital signs:  Filed Vitals:   04/26/13 1741 04/26/13 2230 04/27/13 0152 04/27/13 0632  BP: 1'06/72 95/80 96/58 ' 108/65  Pulse: 85 85 73 85  Temp: 98.9 F (37.2 C) 98.8 F (37.1 C) 98.3 F (36.8 C) 98.6 F (37 C)  TempSrc: Oral Oral    Resp: '16 18 17 17  ' Height:      Weight:      SpO2: 97% 94% 93% 94%    Last BM Date: 04/25/13  Intake/Output   Yesterday:  03/22 0701 - 03/23 0700 In: 3171.7 [P.O.:840; I.V.:2176.7; IV Piggyback:150] Out: 175 [Drains:175] This shift:     Bowel function:  Flatus: flatus  BM: -  Drain: 130m recorded yesterday, today air in bag.   Physical Exam: General: Pt awake/alert/oriented x3 in no acute distress Chest: cta.  No chest wall pain w good excursion CV:  Pulses intact.  Regular rhythm MS: Normal AROM mjr joints.  No obvious deformity Abdomen: Soft.  Nondistended.  TTP to RLQ and LLQ.  No evidence of peritonitis.  No incarcerated hernias. Ext:  SCDs BLE.  No mjr edema.  No cyanosis Skin: No petechiae / purpura   Problem List:   Active Problems:   Diverticulitis of large intestine with perforation and abscess    Results:   Labs: Results for orders placed during the hospital encounter of 04/25/13 (from the past 48 hour(s))  CBC WITH DIFFERENTIAL     Status: Abnormal   Collection Time    04/25/13  3:57 PM      Result Value Ref Range   WBC 13.3 (*) 4.0 - 10.5 K/uL   RBC 4.20  3.87 - 5.11 MIL/uL   Hemoglobin 13.2  12.0 - 15.0 g/dL   HCT 36.3  36.0 - 46.0 %   MCV 86.4  78.0 - 100.0 fL   MCH 31.4  26.0 - 34.0 pg   MCHC 36.4 (*) 30.0 - 36.0 g/dL   RDW 13.6  11.5 - 15.5 %   Platelets 331  150 - 400 K/uL   Neutrophils Relative % 83 (*) 43 - 77 %   Neutro Abs 11.1 (*) 1.7 - 7.7 K/uL   Lymphocytes Relative 8 (*) 12 - 46 %    Lymphs Abs 1.1  0.7 - 4.0 K/uL   Monocytes Relative 9  3 - 12 %   Monocytes Absolute 1.1 (*) 0.1 - 1.0 K/uL   Eosinophils Relative 0  0 - 5 %   Eosinophils Absolute 0.0  0.0 - 0.7 K/uL   Basophils Relative 0  0 - 1 %   Basophils Absolute 0.0  0.0 - 0.1 K/uL  COMPREHENSIVE METABOLIC PANEL     Status: Abnormal   Collection Time    04/25/13  3:57 PM      Result Value Ref Range   Sodium 137  137 - 147 mEq/L   Potassium 3.5 (*) 3.7 - 5.3 mEq/L   Chloride 97  96 - 112 mEq/L   CO2 23  19 - 32 mEq/L   Glucose, Bld 129 (*) 70 - 99 mg/dL   BUN 5 (*) 6 - 23 mg/dL   Creatinine, Ser 0.71  0.50 - 1.10 mg/dL   Calcium 9.3  8.4 - 10.5 mg/dL  Total Protein 7.9  6.0 - 8.3 g/dL   Albumin 3.6  3.5 - 5.2 g/dL   AST 15  0 - 37 U/L   ALT 15  0 - 35 U/L   Alkaline Phosphatase 122 (*) 39 - 117 U/L   Total Bilirubin 0.3  0.3 - 1.2 mg/dL   GFR calc non Af Amer >90  >90 mL/min   GFR calc Af Amer >90  >90 mL/min   Comment: (NOTE)     The eGFR has been calculated using the CKD EPI equation.     This calculation has not been validated in all clinical situations.     eGFR's persistently <90 mL/min signify possible Chronic Kidney     Disease.  LIPASE, BLOOD     Status: None   Collection Time    04/25/13  3:57 PM      Result Value Ref Range   Lipase 18  11 - 59 U/L  URINALYSIS, ROUTINE W REFLEX MICROSCOPIC     Status: Abnormal   Collection Time    04/25/13  3:58 PM      Result Value Ref Range   Color, Urine YELLOW  YELLOW   APPearance CLEAR  CLEAR   Specific Gravity, Urine 1.018  1.005 - 1.030   pH 5.5  5.0 - 8.0   Glucose, UA NEGATIVE  NEGATIVE mg/dL   Hgb urine dipstick SMALL (*) NEGATIVE   Bilirubin Urine NEGATIVE  NEGATIVE   Ketones, ur NEGATIVE  NEGATIVE mg/dL   Protein, ur 100 (*) NEGATIVE mg/dL   Urobilinogen, UA 0.2  0.0 - 1.0 mg/dL   Nitrite NEGATIVE  NEGATIVE   Leukocytes, UA TRACE (*) NEGATIVE  URINE MICROSCOPIC-ADD ON     Status: Abnormal   Collection Time    04/25/13  3:58 PM       Result Value Ref Range   Squamous Epithelial / LPF FEW (*) RARE   WBC, UA 0-2  <3 WBC/hpf   RBC / HPF 3-6  <3 RBC/hpf   Bacteria, UA RARE  RARE  PREGNANCY, URINE     Status: None   Collection Time    04/25/13  3:58 PM      Result Value Ref Range   Preg Test, Ur NEGATIVE  NEGATIVE   Comment:            THE SENSITIVITY OF THIS     METHODOLOGY IS >20 mIU/mL.  BASIC METABOLIC PANEL     Status: Abnormal   Collection Time    04/26/13  6:25 AM      Result Value Ref Range   Sodium 142  137 - 147 mEq/L   Potassium 3.2 (*) 3.7 - 5.3 mEq/L   Chloride 101  96 - 112 mEq/L   CO2 24  19 - 32 mEq/L   Glucose, Bld 88  70 - 99 mg/dL   BUN 5 (*) 6 - 23 mg/dL   Creatinine, Ser 0.66  0.50 - 1.10 mg/dL   Calcium 9.3  8.4 - 10.5 mg/dL   GFR calc non Af Amer >90  >90 mL/min   GFR calc Af Amer >90  >90 mL/min   Comment: (NOTE)     The eGFR has been calculated using the CKD EPI equation.     This calculation has not been validated in all clinical situations.     eGFR's persistently <90 mL/min signify possible Chronic Kidney     Disease.  CBC     Status: Abnormal   Collection  Time    04/26/13  6:25 AM      Result Value Ref Range   WBC 11.7 (*) 4.0 - 10.5 K/uL   RBC 3.99  3.87 - 5.11 MIL/uL   Hemoglobin 12.2  12.0 - 15.0 g/dL   HCT 34.9 (*) 36.0 - 46.0 %   MCV 87.5  78.0 - 100.0 fL   MCH 30.6  26.0 - 34.0 pg   MCHC 35.0  30.0 - 36.0 g/dL   RDW 13.9  11.5 - 15.5 %   Platelets 295  150 - 400 K/uL  PROTIME-INR     Status: None   Collection Time    04/26/13  6:25 AM      Result Value Ref Range   Prothrombin Time 13.9  11.6 - 15.2 seconds   INR 1.09  0.00 - 1.49  CULTURE, ROUTINE-ABSCESS     Status: None   Collection Time    04/26/13 10:31 AM      Result Value Ref Range   Specimen Description ABSCESS     Special Requests LLQ DIVERTICULAR ABSCESS     Gram Stain       Value: ABUNDANT WBC PRESENT, PREDOMINANTLY PMN     NO SQUAMOUS EPITHELIAL CELLS SEEN     MODERATE GRAM POSITIVE RODS      Performed at [facility]   Culture       Value: Culture reincubated for better growth     Performed at [facility]   Report Status PENDING      Imaging / Studies: Ct Abdomen Pelvis W Contrast  04/25/2013   CLINICAL DATA:  Left lower quadrant abdominal pain 5 days  EXAM: CT ABDOMEN AND PELVIS WITH CONTRAST  TECHNIQUE: Multidetector CT imaging of the abdomen and pelvis was performed using the standard protocol following bolus administration of intravenous contrast.  CONTRAST:  36m OMNIPAQUE IOHEXOL 300 MG/ML  SOLN  COMPARISON:  CT ABD/PELVIS W CM dated 07/30/2012  FINDINGS: Lung bases are clear.  No pericardial fluid.  No focal hepatic lesion. The gallbladder, pancreas, spleen, adrenal glands, kidneys demonstrate no acute findings. There is calcifications of the pancreatic head similar prior consistent with chronic pancreatitis.  The stomach, small bowel, and cecum normal. There is a fluid collection along the posterior wall of the descending colon measuring 3.7 x 3.5 cm. This has a thin enhancing rim and extends inferiorly along the pericolic gutter. This finding is most consistent with ruptured diverticulitis with abscess formation. The abscess extends to involve the left psoas muscle and iliacus muscle to a minimal degree (image 50 series 2). No evidence of bowel obstruction. No evidence of frank leak of the oral contrast. Contrast flows entirety of the colon to the rectum.  Abdominal or is normal caliber. No retroperitoneal periportal lymphadenopathy.  No free fluid the pelvis. Uterus and ovaries are normal. No pelvic lymphadenopathy. No aggressive osseous lesion.  IMPRESSION: 1. Abscess along the descending colon is most consistent with ruptured diverticulitis with abscess formation. 2. Abscess extends along the left pericolic gutter to involve the left psoas and iliacus muscle. 3. No evidence of active leak of oral contrast. 4. No significant intraperitoneal free air. Findings conveyed  toJenniffer Pipenbrink, PR[place]3/21/2015  at21:55.   Electronically Signed   By: SSuzy BouchardM.D.   On: 04/25/2013 21:56   Ct Image Guided Drainage By Percutaneous Catheter  04/26/2013   CLINICAL DATA:  Diverticular abscess in the left lower quadrant  EXAM: CT IMAGE GUIDED DRAINAGE BY PERCUTANEOUS  CATHETER  FLUOROSCOPY TIME:  None  MEDICATIONS AND MEDICAL HISTORY: Versed four mg, Fentanyl 100 mcg.  Additional Medications: None.  ANESTHESIA/SEDATION: Moderate sedation time: 40 minutes  CONTRAST:  None.  PROCEDURE: The procedure, risks, benefits, and alternatives were explained to the patient. Questions regarding the procedure were encouraged and answered. The patient understands and consents to the procedure.  The left lower quadrant was prepped with Betadine in a sterile fashion, and a sterile drape was applied covering the operative field. A sterile gown and sterile gloves were used for the procedure.  1% lidocaine was utilized for local anesthesia. Under CT guidance, an 18 gauge needle was inserted into the left lower quadrant abscess posterior to the descending colon. It was removed over an Amplatz wire. A 12 French dilator followed by a 12 [place] drain were inserted. It was looped and string fixed and sewn to the skin. Frank pus was aspirated  FINDINGS: Images demonstrate 6 French drain placement into a diverticular abscess. 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  COMPLICATIONS: None  IMPRESSION: Successful left lower quadrant abscess drain.   Electronically Signed   By: Maryclare Bean M.D.   On: 04/26/2013 10:28    Medications / Allergies: per chart  Antibiotics: Anti-infectives   Start     Dose/Rate Route Frequency Ordered Stop   04/26/13 0400  piperacillin-tazobactam (ZOSYN) IVPB 3.375 g     3.375 g 12.5 mL/hr over 240 Minutes Intravenous 3 times per day 04/26/13 0057     04/25/13 2215  vancomycin (VANCOCIN) IVPB 1000 mg/200 mL premix  Status:   Discontinued     1,000 mg 200 mL/hr over 60 Minutes Intravenous  Once 04/25/13 2208 04/25/13 2237   04/25/13 2215  piperacillin-tazobactam (ZOSYN) IVPB 3.375 g     3.375 g 100 mL/hr over 30 Minutes Intravenous  Once 04/25/13 2208 04/26/13 0013     Assessment/Plan:  Recurrent diverticulitis and diverticular abscess  S/p IR drain 3/22 Mobilize out of bed  full liquid diet  Continue antibiotics  Add Lovenox for VTE prophylaxis Hopefully we can discharge home 1-2 days with drain and close follow up Will need colonoscopy in 6 weeks  Hypokalemia-repeat labs and supplement as needed  Erby Pian, [facility] Pager [phone] Office [phone]  04/27/2013 10:09 AM

## 2013-04-28 LAB — CBC
HCT: 31.8 % — ABNORMAL LOW (ref 36.0–46.0)
Hemoglobin: 10.9 g/dL — ABNORMAL LOW (ref 12.0–15.0)
MCH: 30.2 pg (ref 26.0–34.0)
MCHC: 34.3 g/dL (ref 30.0–36.0)
MCV: 88.1 fL (ref 78.0–100.0)
PLATELETS: 336 10*3/uL (ref 150–400)
RBC: 3.61 MIL/uL — ABNORMAL LOW (ref 3.87–5.11)
RDW: 13.8 % (ref 11.5–15.5)
WBC: 5.1 10*3/uL (ref 4.0–10.5)

## 2013-04-28 LAB — MAGNESIUM: MAGNESIUM: 1.8 mg/dL (ref 1.5–2.5)

## 2013-04-28 MED ORDER — POTASSIUM CHLORIDE CRYS ER 20 MEQ PO TBCR
40.0000 meq | EXTENDED_RELEASE_TABLET | Freq: Three times a day (TID) | ORAL | Status: DC
  Administered 2013-04-28 – 2013-04-29 (×5): 40 meq via ORAL
  Filled 2013-04-28 (×7): qty 2

## 2013-04-28 NOTE — Progress Notes (Signed)
Patient ID: Tracey Fields, female   DOB: [birth year], 53 y.o.   MRN: [id number]   Subjective: Had several BMs yesterday.  Since 4AM complains of worsening cramping and bloating.  White count is normal.  She remains afebrile.  VSS.  C/o left thigh, anterior aspect pain and numbness which also began this morning.    Objective:  Vital signs:  Filed Vitals:   04/27/13 0632 04/27/13 1439 04/27/13 2303 04/28/13 0542  BP: 108/65 103/67 127/84 147/81  Pulse: 85 78 67 72  Temp: 98.6 F (37 C) 98.7 F (37.1 C) 97.9 F (36.6 C) 97.8 F (36.6 C)  TempSrc:  Oral Oral Oral  Resp: '17 16 16 16  ' Height:      Weight:      SpO2: 94% 96% 99% 94%    Last BM Date: 04/25/13  Intake/Output   Yesterday:  03/23 0701 - 03/24 0700 In: 2741.7 [P.O.:960; I.V.:1581.7; IV Piggyback:200] Out: 120 [Drains:120] This shift:    I/O last 3 completed shifts: In: 4228.3 [P.O.:1440; I.V.:2433.3; Other:5; IV Piggyback:350] Out: 220 [Drains:220]   Physical Exam:  General: Pt awake/alert/oriented x3 in no acute distress  Chest: cta. No chest wall pain w good excursion  CV: Pulses intact. Regular rhythm  MS: Normal AROM mjr joints. No obvious deformity.  Sensation is intact.  No neurological deficits.  Straight leg raises are normal   Abdomen: Soft. distended. Moderate TTP to RLQ and LLQ. No evidence of peritonitis. No incarcerated hernias.  Drain with brown output.   Ext: SCDs BLE. No mjr edema. No cyanosis  Skin: No petechiae / purpura   Problem List:   Active Problems:   Diverticulitis of large intestine with perforation and abscess    Results:   Labs: Results for orders placed during the hospital encounter of 04/25/13 (from the past 48 hour(s))  CULTURE, ROUTINE-ABSCESS     Status: None   Collection Time    04/26/13 10:31 AM      Result Value Ref Range   Specimen Description ABSCESS     Special Requests LLQ DIVERTICULAR ABSCESS     Gram Stain       Value: ABUNDANT WBC PRESENT, PREDOMINANTLY  PMN     NO SQUAMOUS EPITHELIAL CELLS SEEN     MODERATE GRAM POSITIVE RODS     Performed at [facility]   Culture       Value: Culture reincubated for better growth     Performed at [facility]   Report Status PENDING    BASIC METABOLIC PANEL     Status: Abnormal   Collection Time    04/27/13  9:46 AM      Result Value Ref Range   Sodium 140  137 - 147 mEq/L   Potassium 3.2 (*) 3.7 - 5.3 mEq/L   Chloride 99  96 - 112 mEq/L   CO2 27  19 - 32 mEq/L   Glucose, Bld 132 (*) 70 - 99 mg/dL   BUN 3 (*) 6 - 23 mg/dL   Creatinine, Ser 0.63  0.50 - 1.10 mg/dL   Calcium 9.0  8.4 - 10.5 mg/dL   GFR calc non Af Amer >90  >90 mL/min   GFR calc Af Amer >90  >90 mL/min   Comment: (NOTE)     The eGFR has been calculated using the CKD EPI equation.     This calculation has not been validated in all clinical situations.     eGFR's persistently <90 mL/min signify possible Chronic  Kidney     Disease.  CBC     Status: Abnormal   Collection Time    04/27/13  9:46 AM      Result Value Ref Range   WBC 7.9  4.0 - 10.5 K/uL   RBC 3.97  3.87 - 5.11 MIL/uL   Hemoglobin 12.1  12.0 - 15.0 g/dL   HCT 34.7 (*) 36.0 - 46.0 %   MCV 87.4  78.0 - 100.0 fL   MCH 30.5  26.0 - 34.0 pg   MCHC 34.9  30.0 - 36.0 g/dL   RDW 13.8  11.5 - 15.5 %   Platelets 336  150 - 400 K/uL  CBC     Status: Abnormal   Collection Time    04/28/13  5:03 AM      Result Value Ref Range   WBC 5.1  4.0 - 10.5 K/uL   RBC 3.61 (*) 3.87 - 5.11 MIL/uL   Hemoglobin 10.9 (*) 12.0 - 15.0 g/dL   HCT 31.8 (*) 36.0 - 46.0 %   MCV 88.1  78.0 - 100.0 fL   MCH 30.2  26.0 - 34.0 pg   MCHC 34.3  30.0 - 36.0 g/dL   RDW 13.8  11.5 - 15.5 %   Platelets 336  150 - 400 K/uL    Imaging / Studies: Ct Image Guided Drainage By Percutaneous Catheter  04/26/2013   CLINICAL DATA:  Diverticular abscess in the left lower quadrant  EXAM: CT IMAGE GUIDED DRAINAGE BY PERCUTANEOUS CATHETER  FLUOROSCOPY TIME:  None  MEDICATIONS AND MEDICAL  HISTORY: Versed four mg, Fentanyl 100 mcg.  Additional Medications: None.  ANESTHESIA/SEDATION: Moderate sedation time: 40 minutes  CONTRAST:  None.  PROCEDURE: The procedure, risks, benefits, and alternatives were explained to the patient. Questions regarding the procedure were encouraged and answered. The patient understands and consents to the procedure.  The left lower quadrant was prepped with Betadine in a sterile fashion, and a sterile drape was applied covering the operative field. A sterile gown and sterile gloves were used for the procedure.  1% lidocaine was utilized for local anesthesia. Under CT guidance, an 18 gauge needle was inserted into the left lower quadrant abscess posterior to the descending colon. It was removed over an Amplatz wire. A 12 French dilator followed by a 12 [place] drain were inserted. It was looped and string fixed and sewn to the skin. Frank pus was aspirated  FINDINGS: Images demonstrate 41 French drain placement into a diverticular abscess. 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  COMPLICATIONS: None  IMPRESSION: Successful left lower quadrant abscess drain.   Electronically Signed   By: Maryclare Bean M.D.   On: 04/26/2013 10:28    Medications / Allergies: per chart  Antibiotics: Anti-infectives   Start     Dose/Rate Route Frequency Ordered Stop   04/26/13 0400  piperacillin-tazobactam (ZOSYN) IVPB 3.375 g     3.375 g 12.5 mL/hr over 240 Minutes Intravenous 3 times per day 04/26/13 0057     04/25/13 2215  vancomycin (VANCOCIN) IVPB 1000 mg/200 mL premix  Status:  Discontinued     1,000 mg 200 mL/hr over 60 Minutes Intravenous  Once 04/25/13 2208 04/25/13 2237   04/25/13 2215  piperacillin-tazobactam (ZOSYN) IVPB 3.375 g     3.375 g 100 mL/hr over 30 Minutes Intravenous  Once 04/25/13 2208 04/26/13 0013     Assessment/Plan:  Recurrent diverticulitis and diverticular abscess  S/p IR drain 3/22  Continue  with non operative  management.  Hopefully we can avoid surgery in the acute phase. Preliminary Cx shows gram+ rods---?change atbx Mobilize out of bed  Back down to clear liquid diet Continue antibiotics  Lovenox for VTE prophylaxis  Will need colonoscopy in 6 weeks  Hypokalemia-26mq x3 doses today, repeat labs in AM.  Add Mg level.  Kcl in IVF.   Left leg pain-no neurological deficits, able to ambulate and bear weight.  Will monitor for now.  Further work up should symptoms worsen.    EErby Pian AMemorial Hermann Katy HospitalSurgery Pager [phone] 04/28/2013 8:17 AM

## 2013-04-28 NOTE — Progress Notes (Signed)
crampy pain. Back down to clears. Await culture ID. Also C/O some superficial pain lateral L thigh. Leg well perfused with good distal pulses. Calf soft. ? nerve irritation from perc drain. Will follow.  Patient examined and I agree with the assessment and plan  Georganna Skeans, MD, MPH, FACS Trauma: [phone] General Surgery: [phone]  04/28/2013 8:59 AM

## 2013-04-28 NOTE — Progress Notes (Signed)
Subjective: LLQ divertic abscess drain placed 3/22 Some better Noticing numbness down L leg today  Objective: Vital signs in last 24 hours: Temp:  [97.8 F (36.6 C)-98.7 F (37.1 C)] 97.8 F (36.6 C) (03/24 0542) Pulse Rate:  [67-78] 72 (03/24 0542) Resp:  [16] 16 (03/24 0542) BP: (103-147)/(67-84) 147/81 mmHg (03/24 0542) SpO2:  [94 %-99 %] 94 % (03/24 0542) Last BM Date: 04/25/13  Intake/Output from previous day: 03/23 0701 - 03/24 0700 In: 2741.7 [P.O.:960; I.V.:1581.7; IV Piggyback:200] Out: 120 [Drains:120] Intake/Output this shift:    PE:  Afeb; vss Wbc 5.1 Drain intact; output 120cc:  fecal with slight redness; +air 15 cc in bag Site is clean and dry; tender Cx: Gr+ rods   Lab Results:   Recent Labs  04/27/13 0946 04/28/13 0503  WBC 7.9 5.1  HGB 12.1 10.9*  HCT 34.7* 31.8*  PLT 336 336   BMET  Recent Labs  04/26/13 0625 04/27/13 0946  NA 142 140  K 3.2* 3.2*  CL 101 99  CO2 24 27  GLUCOSE 88 132*  BUN 5* 3*  CREATININE 0.66 0.63  CALCIUM 9.3 9.0   PT/INR  Recent Labs  04/26/13 0625  LABPROT 13.9  INR 1.09   ABG No results found for this basename: PHART, PCO2, PO2, HCO3,  in the last 72 hours  Studies/Results: Ct Image Guided Drainage By Percutaneous Catheter  04/26/2013   CLINICAL DATA:  Diverticular abscess in the left lower quadrant  EXAM: CT IMAGE GUIDED DRAINAGE BY PERCUTANEOUS CATHETER  FLUOROSCOPY TIME:  None  MEDICATIONS AND MEDICAL HISTORY: Versed four mg, Fentanyl 100 mcg.  Additional Medications: None.  ANESTHESIA/SEDATION: Moderate sedation time: 40 minutes  CONTRAST:  None.  PROCEDURE: The procedure, risks, benefits, and alternatives were explained to the patient. Questions regarding the procedure were encouraged and answered. The patient understands and consents to the procedure.  The left lower quadrant was prepped with Betadine in a sterile fashion, and a sterile drape was applied covering the operative field. A sterile  gown and sterile gloves were used for the procedure.  1% lidocaine was utilized for local anesthesia. Under CT guidance, an 18 gauge needle was inserted into the left lower quadrant abscess posterior to the descending colon. It was removed over an Amplatz wire. A 12 French dilator followed by a 12 [place] drain were inserted. It was looped and string fixed and sewn to the skin. Frank pus was aspirated  FINDINGS: Images demonstrate 80 French drain placement into a diverticular abscess. 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  COMPLICATIONS: None  IMPRESSION: Successful left lower quadrant abscess drain.   Electronically Signed   By: Maryclare Bean M.D.   On: 04/26/2013 10:28    Anti-infectives: Anti-infectives   Start     Dose/Rate Route Frequency Ordered Stop   04/26/13 0400  piperacillin-tazobactam (ZOSYN) IVPB 3.375 g     3.375 g 12.5 mL/hr over 240 Minutes Intravenous 3 times per day 04/26/13 0057     04/25/13 2215  vancomycin (VANCOCIN) IVPB 1000 mg/200 mL premix  Status:  Discontinued     1,000 mg 200 mL/hr over 60 Minutes Intravenous  Once 04/25/13 2208 04/25/13 2237   04/25/13 2215  piperacillin-tazobactam (ZOSYN) IVPB 3.375 g     3.375 g 100 mL/hr over 30 Minutes Intravenous  Once 04/25/13 2208 04/26/13 0013      Assessment/Plan: s/p * No surgery found *  LLQ diverticulitis abscess drain intact Soreness at site slight  red tinge to output today Plan per [facility]   LOS: 3 days    Yuriana Gaal A 04/28/2013

## 2013-04-29 LAB — CBC
HCT: 34.8 % — ABNORMAL LOW (ref 36.0–46.0)
Hemoglobin: 12.2 g/dL (ref 12.0–15.0)
MCH: 30.8 pg (ref 26.0–34.0)
MCHC: 35.1 g/dL (ref 30.0–36.0)
MCV: 87.9 fL (ref 78.0–100.0)
Platelets: 396 10*3/uL (ref 150–400)
RBC: 3.96 MIL/uL (ref 3.87–5.11)
RDW: 13.7 % (ref 11.5–15.5)
WBC: 6.5 10*3/uL (ref 4.0–10.5)

## 2013-04-29 LAB — BASIC METABOLIC PANEL
BUN: 3 mg/dL — ABNORMAL LOW (ref 6–23)
CO2: 28 mEq/L (ref 19–32)
Calcium: 9.5 mg/dL (ref 8.4–10.5)
Chloride: 104 mEq/L (ref 96–112)
Creatinine, Ser: 0.72 mg/dL (ref 0.50–1.10)
Glucose, Bld: 88 mg/dL (ref 70–99)
Potassium: 5.1 mEq/L (ref 3.7–5.3)
SODIUM: 145 meq/L (ref 137–147)

## 2013-04-29 LAB — CULTURE, ROUTINE-ABSCESS

## 2013-04-29 MED ORDER — SODIUM CHLORIDE 0.9 % IV SOLN
INTRAVENOUS | Status: DC
  Administered 2013-04-29 – 2013-04-30 (×2): via INTRAVENOUS
  Filled 2013-04-29 (×4): qty 1000

## 2013-04-29 MED ORDER — FLUCONAZOLE IN SODIUM CHLORIDE 200-0.9 MG/100ML-% IV SOLN
200.0000 mg | INTRAVENOUS | Status: DC
  Administered 2013-04-29 – 2013-04-30 (×2): 200 mg via INTRAVENOUS
  Filled 2013-04-29 (×3): qty 100

## 2013-04-29 NOTE — Progress Notes (Signed)
Patient ID: Tracey Fields, female   DOB: 09-11-1960, 53 y.o.   MRN: [id number]   Subjective: abd pain is better.  Left hip, thigh pain about the same.  No n/v.  Tolerating clears.   Objective:  Vital signs:  Filed Vitals:   04/28/13 0542 04/28/13 1305 04/28/13 2232 04/29/13 0635  BP: 147/81 142/90 136/80 125/74  Pulse: 72 77 71 69  Temp: 97.8 F (36.6 C) 98.4 F (36.9 C) 98.2 F (36.8 C) 97.9 F (36.6 C)  TempSrc: Oral Oral Oral   Resp: '16 16 18 18  ' Height:      Weight:      SpO2: 94% 98% 96% 96%    Last BM Date: 04/28/13  Intake/Output   Yesterday:  03/24 0701 - 03/25 0700 In: 2075 [P.O.:720; I.V.:1245; IV Piggyback:100] Out: 285 [Drains:285] This shift:    I/O last 3 completed shifts: In: 3233.3 [P.O.:960; I.V.:2063.3; Other:10; IV Piggyback:200] Out: 355 [Drains:355]   Physical Exam:  General: Pt awake/alert/oriented x3 in no acute distress  Chest: cta. No chest wall pain w good excursion  CV: Pulses intact. Regular rhythm  MS: Normal AROM mjr joints. No obvious deformity. Sensation is intact. No neurological deficits. Straight leg raises are normal  Abdomen: Soft. Distended.  Mild ttp to LUQ. No evidence of peritonitis. No incarcerated hernias. Drain with brown output.  Ext: SCDs BLE. No mjr edema. No cyanosis  Skin: No petechiae / purpura  Problem List:   Active Problems:   Diverticulitis of large intestine with perforation and abscess    Results:   Labs: Results for orders placed during the hospital encounter of 04/25/13 (from the past 48 hour(s))  BASIC METABOLIC PANEL     Status: Abnormal   Collection Time    04/27/13  9:46 AM      Result Value Ref Range   Sodium 140  137 - 147 mEq/L   Potassium 3.2 (*) 3.7 - 5.3 mEq/L   Chloride 99  96 - 112 mEq/L   CO2 27  19 - 32 mEq/L   Glucose, Bld 132 (*) 70 - 99 mg/dL   BUN 3 (*) 6 - 23 mg/dL   Creatinine, Ser 0.63  0.50 - 1.10 mg/dL   Calcium 9.0  8.4 - 10.5 mg/dL   GFR calc non Af Amer >90  >90  mL/min   GFR calc Af Amer >90  >90 mL/min   Comment: (NOTE)     The eGFR has been calculated using the CKD EPI equation.     This calculation has not been validated in all clinical situations.     eGFR's persistently <90 mL/min signify possible Chronic Kidney     Disease.  CBC     Status: Abnormal   Collection Time    04/27/13  9:46 AM      Result Value Ref Range   WBC 7.9  4.0 - 10.5 K/uL   RBC 3.97  3.87 - 5.11 MIL/uL   Hemoglobin 12.1  12.0 - 15.0 g/dL   HCT 34.7 (*) 36.0 - 46.0 %   MCV 87.4  78.0 - 100.0 fL   MCH 30.5  26.0 - 34.0 pg   MCHC 34.9  30.0 - 36.0 g/dL   RDW 13.8  11.5 - 15.5 %   Platelets 336  150 - 400 K/uL  CBC     Status: Abnormal   Collection Time    04/28/13  5:03 AM      Result Value Ref Range   WBC  5.1  4.0 - 10.5 K/uL   RBC 3.61 (*) 3.87 - 5.11 MIL/uL   Hemoglobin 10.9 (*) 12.0 - 15.0 g/dL   HCT 31.8 (*) 36.0 - 46.0 %   MCV 88.1  78.0 - 100.0 fL   MCH 30.2  26.0 - 34.0 pg   MCHC 34.3  30.0 - 36.0 g/dL   RDW 13.8  11.5 - 15.5 %   Platelets 336  150 - 400 K/uL  MAGNESIUM     Status: None   Collection Time    04/28/13  9:22 AM      Result Value Ref Range   Magnesium 1.8  1.5 - 2.5 mg/dL  CBC     Status: Abnormal   Collection Time    04/29/13  5:45 AM      Result Value Ref Range   WBC 6.5  4.0 - 10.5 K/uL   RBC 3.96  3.87 - 5.11 MIL/uL   Hemoglobin 12.2  12.0 - 15.0 g/dL   HCT 34.8 (*) 36.0 - 46.0 %   MCV 87.9  78.0 - 100.0 fL   MCH 30.8  26.0 - 34.0 pg   MCHC 35.1  30.0 - 36.0 g/dL   RDW 13.7  11.5 - 15.5 %   Platelets 396  150 - 400 K/uL  BASIC METABOLIC PANEL     Status: Abnormal   Collection Time    04/29/13  5:45 AM      Result Value Ref Range   Sodium 145  137 - 147 mEq/L   Potassium 5.1  3.7 - 5.3 mEq/L   Comment: NO VISIBLE HEMOLYSIS   Chloride 104  96 - 112 mEq/L   CO2 28  19 - 32 mEq/L   Glucose, Bld 88  70 - 99 mg/dL   BUN 3 (*) 6 - 23 mg/dL   Creatinine, Ser 0.72  0.50 - 1.10 mg/dL   Calcium 9.5  8.4 - 10.5 mg/dL   GFR  calc non Af Amer >90  >90 mL/min   GFR calc Af Amer >90  >90 mL/min   Comment: (NOTE)     The eGFR has been calculated using the CKD EPI equation.     This calculation has not been validated in all clinical situations.     eGFR's persistently <90 mL/min signify possible Chronic Kidney     Disease.    Imaging / Studies: No results found.  Medications / Allergies: per chart  Antibiotics: Anti-infectives   Start     Dose/Rate Route Frequency Ordered Stop   04/26/13 0400  piperacillin-tazobactam (ZOSYN) IVPB 3.375 g     3.375 g 12.5 mL/hr over 240 Minutes Intravenous 3 times per day 04/26/13 0057     04/25/13 2215  vancomycin (VANCOCIN) IVPB 1000 mg/200 mL premix  Status:  Discontinued     1,000 mg 200 mL/hr over 60 Minutes Intravenous  Once 04/25/13 2208 04/25/13 2237   04/25/13 2215  piperacillin-tazobactam (ZOSYN) IVPB 3.375 g     3.375 g 100 mL/hr over 30 Minutes Intravenous  Once 04/25/13 2208 04/26/13 0013     Assessment/Plan:  Recurrent diverticulitis and diverticular abscess  S/p IR drain 3/22  Continue with non operative management. Hopefully we can avoid surgery in the acute phase.  Preliminary Cx shows gram+ rods and candida---?add fluconazole Mobilize out of bed  Clear liquid diet Continue antibiotics  Lovenox for VTE prophylaxis  Will need colonoscopy in 6 weeks  Hypokalemia-corrected, continue to monitor. Left leg pain-no changes, likely from  drain, continue to monitor.    Erby Pian, [facility] Surgery Pager [phone] Office [phone]  04/29/2013 9:13 AM

## 2013-04-29 NOTE — Progress Notes (Signed)
On fulls. A little less tender. Add Diflucan. Patient examined and I agree with the assessment and plan  Georganna Skeans, MD, MPH, FACS Trauma: [phone] General Surgery: [phone]  04/29/2013 4:59 PM

## 2013-04-30 MED ORDER — PANTOPRAZOLE SODIUM 40 MG PO TBEC
40.0000 mg | DELAYED_RELEASE_TABLET | Freq: Every day | ORAL | Status: DC
  Administered 2013-04-30 – 2013-05-01 (×2): 40 mg via ORAL
  Filled 2013-04-30 (×2): qty 1

## 2013-04-30 NOTE — Progress Notes (Signed)
  Subjective: divertic abscess drain placed 3/22 Still with output that is significant Pt pleasant and in good spirits  Objective: Vital signs in last 24 hours: Temp:  [98.7 F (37.1 C)-98.9 F (37.2 C)] 98.7 F (37.1 C) (03/26 0603) Pulse Rate:  [62-85] 62 (03/26 0603) Resp:  [17] 17 (03/26 0603) BP: (119-135)/(69-89) 119/69 mmHg (03/26 0603) SpO2:  [96 %] 96 % (03/26 0603) Last BM Date: 04/28/13  Intake/Output from previous day: 03/25 0701 - 03/26 0700 In: 919.2 [P.O.:240; I.V.:469.2; IV Piggyback:200] Out: 180 [Drains:180] Intake/Output this shift: Total I/O In: 240 [P.O.:240] Out: -   PE:  Afeb; VSS Drain intact Output fecal; +candida 180 cc yesterday; 40 cc in bag; +air Site clean and dry    Lab Results:   Recent Labs  04/28/13 0503 04/29/13 0545  WBC 5.1 6.5  HGB 10.9* 12.2  HCT 31.8* 34.8*  PLT 336 396   BMET  Recent Labs  04/29/13 0545  NA 145  K 5.1  CL 104  CO2 28  GLUCOSE 88  BUN 3*  CREATININE 0.72  CALCIUM 9.5   PT/INR No results found for this basename: LABPROT, INR,  in the last 72 hours ABG No results found for this basename: PHART, PCO2, PO2, HCO3,  in the last 72 hours  Studies/Results: No results found.  Anti-infectives: Anti-infectives   Start     Dose/Rate Route Frequency Ordered Stop   04/29/13 1700  fluconazole (DIFLUCAN) IVPB 200 mg     200 mg 100 mL/hr over 60 Minutes Intravenous Every 24 hours 04/29/13 1700 05/04/13 1714   04/26/13 0400  piperacillin-tazobactam (ZOSYN) IVPB 3.375 g     3.375 g 12.5 mL/hr over 240 Minutes Intravenous 3 times per day 04/26/13 0057     04/25/13 2215  vancomycin (VANCOCIN) IVPB 1000 mg/200 mL premix  Status:  Discontinued     1,000 mg 200 mL/hr over 60 Minutes Intravenous  Once 04/25/13 2208 04/25/13 2237   04/25/13 2215  piperacillin-tazobactam (ZOSYN) IVPB 3.375 g     3.375 g 100 mL/hr over 30 Minutes Intravenous  Once 04/25/13 2208 04/26/13 0013      Assessment/Plan: s/p  * No surgery found *  Will need drain injection in IR 1-2 weeks after dc IR PA can arrange for injection---let us know when discharging   LOS: 5 days    Tayah Idrovo A 04/30/2013

## 2013-04-30 NOTE — Progress Notes (Signed)
Improving. Try low residue diet. Mild oral thrush. On Diflucan for candida in drain. Hope to D/C tomorrow. Patient examined and I agree with the assessment and plan  Georganna Skeans, MD, MPH, FACS Trauma: [phone] General Surgery: [phone]  04/30/2013 2:40 PM

## 2013-04-30 NOTE — Progress Notes (Signed)
Patient ID: Tracey Fields, female   DOB: 1960-08-19, 53 y.o.   MRN: [id number]   Subjective: Pain a bit better, now more intermittent.  Using percocet and prn dilaudid frequently.  Tolerated full liquids.  Objective:  Vital signs:  Filed Vitals:   04/29/13 0635 04/29/13 1500 04/29/13 2035 04/30/13 0603  BP: 125/74 135/89 125/72 119/69  Pulse: 69 85 74 62  Temp: 97.9 F (36.6 C) 98.9 F (37.2 C) 98.7 F (37.1 C) 98.7 F (37.1 C)  TempSrc:  Oral Oral Oral  Resp: '18 17 17 17  ' Height:      Weight:      SpO2: 96% 96% 96% 96%    Last BM Date: 04/28/13  Intake/Output   Yesterday:  03/25 0701 - 03/26 0700 In: 919.2 [P.O.:240; I.V.:469.2; IV Piggyback:200] Out: 180 [Drains:180] This shift:    I/O last 3 completed shifts: In: 2449.2 [P.O.:960; I.V.:1169.2; Other:20; IV [id number]:782] Out: 345 [Drains:345]   Physical Exam:  General: Pt awake/alert/oriented x3 in no acute distress  Chest: cta. No chest wall pain w good excursion  CV: Pulses intact. Regular rhythm  MS: Normal AROM mjr joints. No obvious deformity. Sensation is intact. No neurological deficits. Straight leg raises are normal  Abdomen: Soft. Distended. Mild ttp to LUQ. No evidence of peritonitis. No incarcerated hernias. Drain with brown output.  Ext: SCDs BLE. No mjr edema. No cyanosis  Skin: No petechiae / purpura    Problem List:   Active Problems:   Diverticulitis of large intestine with perforation and abscess    Results:   Labs: Results for orders placed during the hospital encounter of 04/25/13 (from the past 48 hour(s))  MAGNESIUM     Status: None   Collection Time    04/28/13  9:22 AM      Result Value Ref Range   Magnesium 1.8  1.5 - 2.5 mg/dL  CBC     Status: Abnormal   Collection Time    04/29/13  5:45 AM      Result Value Ref Range   WBC 6.5  4.0 - 10.5 K/uL   RBC 3.96  3.87 - 5.11 MIL/uL   Hemoglobin 12.2  12.0 - 15.0 g/dL   HCT 34.8 (*) 36.0 - 46.0 %   MCV 87.9  78.0 - 100.0 fL    MCH 30.8  26.0 - 34.0 pg   MCHC 35.1  30.0 - 36.0 g/dL   RDW 13.7  11.5 - 15.5 %   Platelets 396  150 - 400 K/uL  BASIC METABOLIC PANEL     Status: Abnormal   Collection Time    04/29/13  5:45 AM      Result Value Ref Range   Sodium 145  137 - 147 mEq/L   Potassium 5.1  3.7 - 5.3 mEq/L   Comment: NO VISIBLE HEMOLYSIS   Chloride 104  96 - 112 mEq/L   CO2 28  19 - 32 mEq/L   Glucose, Bld 88  70 - 99 mg/dL   BUN 3 (*) 6 - 23 mg/dL   Creatinine, Ser 0.72  0.50 - 1.10 mg/dL   Calcium 9.5  8.4 - 10.5 mg/dL   GFR calc non Af Amer >90  >90 mL/min   GFR calc Af Amer >90  >90 mL/min   Comment: (NOTE)     The eGFR has been calculated using the CKD EPI equation.     This calculation has not been validated in all clinical situations.     eGFR's  persistently <90 mL/min signify possible Chronic Kidney     Disease.    Imaging / Studies: No results found.  Medications / Allergies: per chart  Antibiotics: Anti-infectives   Start     Dose/Rate Route Frequency Ordered Stop   04/29/13 1700  fluconazole (DIFLUCAN) IVPB 200 mg     200 mg 100 mL/hr over 60 Minutes Intravenous Every 24 hours 04/29/13 1700 05/04/13 1714   04/26/13 0400  piperacillin-tazobactam (ZOSYN) IVPB 3.375 g     3.375 g 12.5 mL/hr over 240 Minutes Intravenous 3 times per day 04/26/13 0057     04/25/13 2215  vancomycin (VANCOCIN) IVPB 1000 mg/200 mL premix  Status:  Discontinued     1,000 mg 200 mL/hr over 60 Minutes Intravenous  Once 04/25/13 2208 04/25/13 2237   04/25/13 2215  piperacillin-tazobactam (ZOSYN) IVPB 3.375 g     3.375 g 100 mL/hr over 30 Minutes Intravenous  Once 04/25/13 2208 04/26/13 0013     Assessment/Plan:  Recurrent diverticulitis and diverticular abscess  S/p IR drain 3/22  Continue with non operative management. Hopefully we can avoid surgery in the acute phase.  Preliminary Cx shows gram+ rods and candida---added fluconazole 3/25 Mobilize out of bed  Full liquid diet, will wait on advancing  to soft until discussion with attending Continue antibiotics  Lovenox for VTE prophylaxis  Will need colonoscopy in 6 weeks  Hypokalemia-corrected, repeat labs in AM.  Left leg pain-some improvement, continue to monitor.   Erby Pian, [facility] Surgery Pager [phone] Office [phone]  04/30/2013 7:39 AM

## 2013-05-01 DIAGNOSIS — B370 Candidal stomatitis: Secondary | ICD-10-CM

## 2013-05-01 LAB — BASIC METABOLIC PANEL
BUN: 4 mg/dL — AB (ref 6–23)
CALCIUM: 9.5 mg/dL (ref 8.4–10.5)
CO2: 28 mEq/L (ref 19–32)
CREATININE: 0.82 mg/dL (ref 0.50–1.10)
Chloride: 102 mEq/L (ref 96–112)
GFR, EST NON AFRICAN AMERICAN: 81 mL/min — AB (ref >90)
Glucose, Bld: 95 mg/dL (ref 70–99)
Potassium: 5 mEq/L (ref 3.7–5.3)
Sodium: 144 mEq/L (ref 137–147)

## 2013-05-01 LAB — CBC
HEMATOCRIT: 36.2 % (ref 36.0–46.0)
Hemoglobin: 12.5 g/dL (ref 12.0–15.0)
MCH: 30.5 pg (ref 26.0–34.0)
MCHC: 34.5 g/dL (ref 30.0–36.0)
MCV: 88.3 fL (ref 78.0–100.0)
PLATELETS: 429 10*3/uL — AB (ref 150–400)
RBC: 4.1 MIL/uL (ref 3.87–5.11)
RDW: 13.8 % (ref 11.5–15.5)
WBC: 7.2 10*3/uL (ref 4.0–10.5)

## 2013-05-01 MED ORDER — AMOXICILLIN-POT CLAVULANATE 875-125 MG PO TABS: 1.0000 | ORAL_TABLET | Freq: Two times a day (BID) | ORAL | Status: DC

## 2013-05-01 MED ORDER — OXYCODONE-ACETAMINOPHEN 5-325 MG PO TABS: 1.0000 | ORAL_TABLET | Freq: Four times a day (QID) | ORAL | Status: DC | PRN

## 2013-05-01 MED ORDER — FLUCONAZOLE 200 MG PO TABS: 200.0000 mg | ORAL_TABLET | Freq: Every day | ORAL | Status: DC

## 2013-05-01 NOTE — Discharge Summary (Signed)
Physician Discharge Summary  Patient ID: Tracey Fields MRN: [id number] DOB/AGE: 1961-01-09 53 y.o.  Admit date: 04/25/2013 Discharge date: 05/01/2013  Admitting Diagnosis: Diverticulitis with abscess Leukocytosis  Discharge Diagnosis Patient Active Problem List   Diagnosis Date Noted  . Oral candida 05/01/2013  . Diverticulitis of large intestine with perforation and abscess 04/25/2013  . 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 Ventral hernia 10/01/2012  . Chronic pancreatitis 07/29/2012  . COPD (chronic obstructive pulmonary disease) 04/01/2012  . 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 Insomnia 04/01/2012  . Preventative health care 10/19/2010  . Impaired glucose tolerance 10/19/2010  . Smoking 10/19/2010  . BACK PAIN 01/12/2009  . HOARSENESS, CHRONIC 08/13/2008  . FATIGUE 07/14/2008  . DYSPNEA 06/02/2008  . DEPRESSION 12/29/2007  . HYPERTENSION 12/29/2007  . DIVERTICULOSIS, COLON 12/29/2007  . ANXIETY 09/21/2006  . 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 Irritable bowel syndrome 09/21/2006  . COUGH, CHRONIC 09/21/2006  . HEMORRHOIDS, HX OF 09/21/2006    Consultants Dr. Barbie Banner (IR)  Imaging: CT ABDOMEN AND PELVIS WITH CONTRAST (04/25/13) TECHNIQUE:  Multidetector CT imaging of the abdomen and pelvis was performed  using the standard protocol following bolus administration of  intravenous contrast.  CONTRAST: 12mL OMNIPAQUE IOHEXOL 300 MG/ML SOLN  COMPARISON: CT ABD/PELVIS W CM dated 07/30/2012  FINDINGS:  Lung bases are clear. No pericardial fluid.  No focal hepatic lesion. The gallbladder, pancreas, spleen, adrenal  glands, kidneys demonstrate no acute findings. There is  calcifications of the pancreatic head similar prior consistent with  chronic pancreatitis.  The stomach, small bowel, and cecum normal. There is a fluid  collection along the posterior wall of the descending colon  measuring 3.7 x 3.5 cm. This has a  thin enhancing rim and extends  inferiorly along the pericolic gutter. This finding is most  consistent with ruptured diverticulitis with abscess formation. The  abscess extends to involve the left psoas muscle and iliacus muscle  to a minimal degree (image 50 series 2). No evidence of bowel  obstruction. No evidence of frank leak of the oral contrast.  Contrast flows entirety of the colon to the rectum.  Abdominal or is normal caliber. No retroperitoneal periportal  lymphadenopathy.  No free fluid the pelvis. Uterus and ovaries are normal. No pelvic  lymphadenopathy. No aggressive osseous lesion.  IMPRESSION:  1. Abscess along the descending colon is most consistent with  ruptured diverticulitis with abscess formation.  2. Abscess extends along the left pericolic gutter to involve the  left psoas and iliacus muscle.  3. No evidence of active leak of oral contrast.  4. No significant intraperitoneal free air.   Electronically Signed  By: Suzy Bouchard M.D.  On: 04/25/2013 21:56   Procedures Dr. Toni Amend Hoss (04/26/13) - CT guided percutaneous catheter drainage of intraabdominal abscess  Hospital Course:  53 y/o female who has had several outpatient episodes of diverticulitis treated with oral abx the last she thinks a year ago. Since Monday (04/20/13) she has had LLQ pain that has been worsening and not responsive to any therapies at home. She has not been eating. Has been taking some liquids. Having some bms and passing flatus. No fevers. She began taking cipro and flagyl at recommendation of Dr Marla Roe office but that has not helped this time. She came to er and underwent CT that shows diverticular abscess.  Patient was admitted and underwent procedure listed above.  Tolerated procedure well and was transferred to the floor.  Diet was advanced  as tolerated.  Potassium was repleated as needed.  She experienced oral thrush and candida along with gram positive rods were seen on her abscess  fluid culture.  Diflucan was added to cover this.  After the IR drain placement she experienced some referred pain to her left thigh, but there are no neuro deficits and she is able to ambulate despite this.  We presume it is referred nerve pain which should resolve over time as inflammation resolves and the drain is removed.  On HD #7, the patient was voiding well, tolerating diet, ambulating well, pain well controlled, vital signs stable, drain functioning properly and felt stable for discharge home.  Patient will follow up in our office in 2 weeks and knows to call with questions or concerns.  The patient will continue Augmentin for 2 weeks after discharge as well as 3 doses of diflucan to cover her oral thrush and candida in cultures.  May benefit from a probiotic to help prevent thrush while on antibiotics, she will need follow up colonoscopy in 6 weeks, recheck drain study with IR on 05/13/13 and follow up with Dr. Donne Hazel after the drain study.   Physical Exam: General:  Alert, NAD, pleasant, comfortable Abd:  Soft, ND, mild tenderness, IR drain site clean, nicely in place with brown purulent drainage in gravity bag Thigh:  Tenderness which radiates from the drain site to the left thigh, no neuro deficits, CSM intact b/l    Medication List    STOP taking these medications       ciprofloxacin 500 MG tablet  Commonly known as:  CIPRO     metroNIDAZOLE 500 MG tablet  Commonly known as:  FLAGYL      TAKE these medications       acetaminophen 500 MG tablet  Commonly known as:  TYLENOL  Take 1,000 mg by mouth 2 (two) times daily as needed (pain).     ALEVE 220 MG tablet  Generic drug:  naproxen sodium  Take 440 mg by mouth daily as needed (pain).     ALPRAZolam 0.5 MG tablet  Commonly known as:  XANAX  Take 0.25 mg by mouth 3 (three) times daily as needed for anxiety.     amoxicillin-clavulanate 875-125 MG per tablet  Commonly known as:  AUGMENTIN  Take 1 tablet by mouth 2 (two)  times daily.     aspirin EC 81 MG tablet  Take 81 mg by mouth at bedtime.     b complex vitamins capsule  Take 1 capsule by mouth at bedtime.     cetirizine 10 MG tablet  Commonly known as:  ZYRTEC  Take 5 mg by mouth at bedtime.     CREON 36000 UNITS Cpep  Generic drug:  Pancrelipase (Lip-Prot-Amyl)  Take 36,000 Units by mouth 3 (three) times daily.     desvenlafaxine 100 MG 24 hr tablet  Commonly known as:  PRISTIQ  Take 200 mg by mouth daily.     fluconazole 200 MG tablet  Commonly known as:  DIFLUCAN  Take 1 tablet (200 mg total) by mouth daily.     ibuprofen 200 MG tablet  Commonly known as:  ADVIL,MOTRIN  Take 400 mg by mouth 3 (three) times daily as needed (pain).     oxyCODONE-acetaminophen 5-325 MG per tablet  Commonly known as:  PERCOCET/ROXICET  Take 1-2 tablets by mouth every 6 (six) hours as needed for severe pain.     propranolol 10 MG tablet  Commonly known as:  INDERAL  Take 10 mg by mouth daily.     zolpidem 10 MG tablet  Commonly known as:  AMBIEN  Take 5 mg by mouth at bedtime as needed for sleep.         Follow-up Information   Follow up with [facility] On 05/13/2013. (COME TO [facility] (FOR YOUR DRAIN STUDY) ON 05/13/13 AT 7:30AM TO CHECK IN FOR AN 8:00AM APPOINTMENT)       Follow up with [name]., MD. Schedule an appointment as soon as possible for a visit on 05/21/2013. (For post-hospital check and recheck after drain study, appointment at 2:30pm, but please arrive by 2:00pm for check in)    Specialty:  General Surgery   Contact information:   [place] [address] [phone]       Future Appointments Date Time Provider [place]  05/13/2013 8:00 AM [facility]  05/21/2013 2:30 PM [name], MD [facility] None  06/08/2013 11:00 AM [facility] Previsit Rm 51 [facility]  06/18/2013 11:30 AM Gatha Mayer, MD [facility]    Signed: Coralie Keens,  [facility] Surgery [phone]  05/01/2013, 1:02 PM

## 2013-05-01 NOTE — Discharge Summary (Signed)
Discharge paperwork reviewed with patient. Patient's sister Lelon Frohlich demonstrated how to flush drain for patient. Follow up appointment reviewed. Patient educated on drain. Patient was offered smoking cessation information. Rx given and reviewed. Patient is ready for discharge.

## 2013-05-01 NOTE — Plan of Care (Cosign Needed)
Problem: Food- and Nutrition-Related Knowledge Deficit (NB-1.1) Goal: Nutrition education Formal process to instruct or train a patient/client in a skill or to impart knowledge to help patients/clients voluntarily manage or modify food choices and eating behavior to maintain or improve health. Outcome: Completed/Met Date Met:  05/01/13 Nutrition Education Note  Dietetic Intern consulted for nutrition education regarding diverticulitis.  Dietetic Intern provided "Low Fiber Nutrition Therapy" from the [employer]. Reviewed high fiber and low fiber foods. Encouraged patient to follow low fiber diet until symptoms resolve. Encouraged intake of low fiber fruits and vegetables. Discouraged intake of raw, undercooked vegetables, fruits with peels/skins, and whole grains. Discussed gradually increasing fiber intake once symptoms resolve. Encouraged patient to slowly reintroduce fiber back into diet over several days to prevent GI discomfort.   Dietetic intern discussed why it is important for patient to adhere to diet recommendations, and emphasized the role of fiber in the diet. Patient was very receptive to education. Teach back method used.  Expect good compliance.  Body mass index is 22.25 kg/(m^2). Patient meets criteria for normal weight based on current BMI.  Current diet order is soft, patient reports eating well at this time. Labs and medications reviewed. No further nutrition interventions warranted at this time. If additional nutrition issues arise, please re-consult RD.  Claudell Kyle, Dietetic Intern Pager: [phone]

## 2013-05-01 NOTE — Discharge Instructions (Signed)
Diverticulitis °A diverticulum is a small pouch or sac on the colon. Diverticulosis is the presence of these diverticula on the colon. Diverticulitis is the irritation (inflammation) or infection of diverticula. °CAUSES  °The colon and its diverticula contain bacteria. If food particles block the tiny opening to a diverticulum, the bacteria inside can grow and cause an increase in pressure. This leads to infection and inflammation and is called diverticulitis. °SYMPTOMS  °· Abdominal pain and tenderness. Usually, the pain is located on the left side of your abdomen. However, it could be located elsewhere. °· Fever. °· Bloating. °· Feeling sick to your stomach (nausea). °· Throwing up (vomiting). °· Abnormal stools. °DIAGNOSIS  °Your caregiver will take a history and perform a physical exam. Since many things can cause abdominal pain, other tests may be necessary. Tests may include: °· Blood tests. °· Urine tests. °· X-ray of the abdomen. °· CT scan of the abdomen. °Sometimes, surgery is needed to determine if diverticulitis or other conditions are causing your symptoms. °TREATMENT  °Most of the time, you can be treated without surgery. Treatment includes: °· Resting the bowels by only having liquids for a few days. As you improve, you will need to eat a low-fiber diet. °· Intravenous (IV) fluids if you are losing body fluids (dehydrated). °· Antibiotic medicines that treat infections may be given. °· Pain and nausea medicine, if needed. °· Surgery if the inflamed diverticulum has burst. °HOME CARE INSTRUCTIONS  °· Try a clear liquid diet (broth, tea, or water for as long as directed by your caregiver). You may then gradually begin a low-fiber diet as tolerated.  °A low-fiber diet is a diet with less than 10 grams of fiber. Choose the foods below to reduce fiber in the diet: °· White breads, cereals, rice, and pasta. °· Cooked fruits and vegetables or soft fresh fruits and vegetables without the skin. °· Ground or  well-cooked tender beef, ham, veal, lamb, pork, or poultry. °· Eggs and seafood. °· After your diverticulitis symptoms have improved, your caregiver may put you on a high-fiber diet. A high-fiber diet includes 14 grams of fiber for every 1000 calories consumed. For a standard 2000 calorie diet, you would need 28 grams of fiber. Follow these diet guidelines to help you increase the fiber in your diet. It is important to slowly increase the amount fiber in your diet to avoid gas, constipation, and bloating. °· Choose whole-grain breads, cereals, pasta, and brown rice. °· Choose fresh fruits and vegetables with the skin on. Do not overcook vegetables because the more vegetables are cooked, the more fiber is lost. °· Choose more nuts, seeds, legumes, dried peas, beans, and lentils. °· Look for food products that have greater than 3 grams of fiber per serving on the Nutrition Facts label. °· Take all medicine as directed by your caregiver. °· If your caregiver has given you a follow-up appointment, it is very important that you go. Not going could result in lasting (chronic) or permanent injury, pain, and disability. If there is any problem keeping the appointment, call to reschedule. °SEEK MEDICAL CARE IF:  °· Your pain does not improve. °· You have a hard time advancing your diet beyond clear liquids. °· Your bowel movements do not return to normal. °SEEK IMMEDIATE MEDICAL CARE IF:  °· Your pain becomes worse. °· You have an oral temperature above 102° F (38.9° C), not controlled by medicine. °· You have repeated vomiting. °· You have bloody or black, tarry stools. °·   Symptoms that brought you to your caregiver become worse or are not getting better. MAKE SURE YOU:   Understand these instructions.  Will watch your condition.  Will get help right away if you are not doing well or get worse. Document Released: 11/01/2004 Document Revised: 04/16/2011 Document Reviewed: 02/27/2010 [facility] Patient Information  2014 [place].  Low-Fiber Diet (until diverticulitis healed) Fiber is found in fruits, vegetables, and grains. A low-fiber diet restricts fibrous foods that are not digested in the small intestine. A diet containing about 10 grams of fiber is considered low fiber.  PURPOSE  To prevent blockage of a partially obstructed or narrowed gastrointestinal tract.  To reduce fecal weight and volume.  To slow the movement of feces. WHEN IS THIS DIET USED?  It may be used during the acute phase of Crohn disease, ulcerative colitis, regional enteritis, or diverticulitis.  It may be used if your intestinal or esophageal tubes are narrowing (stenosis).  It may be used as a transitional diet following surgery, injury (trauma), or illness. CHOOSING FOODS Check labels, especially on foods from the starch list. Often times, dietary fiber content is listed on the nutrition facts panel. Please ask your Registered Dietitian if you have questions about specific foods that are related to your condition, especially if the food is not listed on this handout. Breads and Starches  Allowed: White, [place], and pita breads, plain rolls, buns, or sweet rolls, doughnuts, waffles, pancakes, bagels. Plain muffins, biscuits, matzoth. Soda, saltine, graham crackers. Pretzels, rusks, melba toast, zwieback. Cooked cereals: cornmeal, farina, or cream cereals. Dry cereals: refined corn, wheat, rice, and oat cereals (check label). Potatoes prepared any way without skins, refined macaroni, spaghetti, noodles, refined rice.  Avoid: Whole-wheat bread, rolls, and crackers. Multigrains, rye, bran seeds, nuts, or coconut. Cereals containing whole grains, multigrains, bran, coconut, nuts, raisins. Cooked or dry oatmeal. Coarse wheat cereals, granola. Cereals advertised as "high fiber." Potato skins. Whole-grain pasta, wild or brown rice. Popcorn. Vegetables  Allowed: Strained tomato and vegetable juices. Fresh lettuce, cucumber,  spinach. Well-cooked or canned: asparagus, bean sprouts, broccoli, cut green beans, cauliflower, pumpkin, beets, mushrooms, yellow squash, tomato, tomato sauce, zucchini, turnips.Keep servings limited to  cup.  Avoid: Fresh, cooked, or canned: artichokes, baked beans, beet greens, Brussels sprouts, corn, kale, legumes, peas, sweet potatoes. Avoid large servings of any vegetables. Fruit  Allowed: All fruit juices except prune juice. Cooked or canned fruits without skin and seeds: apricots, applesauce, cantaloupe, cherries, grapefruit, grapes, kiwi, mandarin oranges, peaches, pears, fruit cocktail, pineapple, plums, watermelon. Fresh without skin: banana, grapes, cantaloupe, avocado, cherries, pineapple, kiwi, nectarines, peaches, blueberries. Keep servings limited to  cup or 1 piece.  Avoid: Fresh: apples with or without skin, apricots, mangoes, pears, raspberries, strawberries. Prune juice and juices with pulp, stewed or dried prunes. Dried fruits, raisins, dates. Avoid large servings of all fresh fruits. Meat and Protein Substitutes  Allowed: Ground or well-cooked tender beef, ham, veal, lamb, pork, poultry. Eggs, plain cheese. Fish, oysters, shrimp, lobster, other seafood. Liver, organ meats. Smooth nut butters.  Avoid: Tough, fibrous meats with gristle. Chunky nut butter.Cheese with seeds, nuts, or other foods not allowed. Nuts, seeds, legumes, dried peas, beans, lentils. Dairy  Allowed: All milk products except those not allowed.  Avoid: Yogurt or cheese that contains nuts, seeds, or added fruit. Soups and Combination Foods  Allowed: Bouillon, broth, or cream soups made from allowed foods. Any strained soup. Casseroles or mixed dishes made with allowed foods.  Avoid: Soups made from vegetables  that are not allowed or that contain other foods not allowed. Desserts and Sweets  Allowed:Plain cakes and cookies, pie made with allowed fruit, pudding, custard, cream pie. Gelatin, fruit,  ice, sherbet, frozen ice pops. Ice cream, ice milk without nuts. Plain hard candy, honey, jelly, molasses, syrup, sugar, chocolate syrup, gumdrops, marshmallows.  Avoid: Desserts, cookies, or candies that contain nuts, peanut butter, dried fruits. Jams, preserves with seeds, marmalade. Fats and Oils  Allowed:Margarine, butter, cream, mayonnaise, salad oils, plain salad dressings made from allowed foods.  Avoid: Seeds, nuts, olives. Beverages  Allowed: All, except those listed to avoid.  Avoid: Fruit juices with high pulp, prune juice. Condiments  Allowed:Ketchup, mustard, horseradish, vinegar, cream sauce, cheese sauce, cocoa powder. Spices in moderation: allspice, basil, bay leaves, celery powder or leaves, cinnamon, cumin powder, curry powder, ginger, mace, marjoram, onion or garlic powder, oregano, paprika, parsley flakes, ground pepper, rosemary, sage, savory, tarragon, thyme, turmeric.  Avoid: Coconut, pickles. SAMPLE MENU Breakfast   cup orange juice.  1 boiled egg.  1 slice white toast.  Margarine.   cup cornflakes.  1 cup milk.  Beverage. Lunch   cup chicken noodle soup.  2 to 3 oz sliced roast beef.  2 slices white bread.  Mayonnaise.   cup tomato juice.  1 small banana.  Beverage. Dinner  3 oz baked chicken.   cup scalloped potatoes.   cup cooked beets.  White dinner roll.  Margarine.   cup canned peaches.  Beverage. Document Released: 07/14/2001 Document Revised: 09/24/2012 Document Reviewed: 02/08/2011 [facility] Patient Information 2014 [place].  High-Fiber Diet (after diverticulitis healed) Fiber is found in fruits, vegetables, and grains. A high-fiber diet encourages the addition of more whole grains, legumes, fruits, and vegetables in your diet. The recommended amount of fiber for adult males is 38 g per day. For adult females, it is 25 g per day. Pregnant and lactating women should get 28 g of fiber per day. If you  have a digestive or bowel problem, ask your caregiver for advice before adding high-fiber foods to your diet. Eat a variety of high-fiber foods instead of only a select few type of foods.  PURPOSE  To increase stool bulk.  To make bowel movements more regular to prevent constipation.  To lower cholesterol.  To prevent overeating. WHEN IS THIS DIET USED?  It may be used if you have constipation and hemorrhoids.  It may be used if you have uncomplicated diverticulosis (intestine condition) and irritable bowel syndrome.  It may be used if you need help with weight management.  It may be used if you want to add it to your diet as a protective measure against atherosclerosis, diabetes, and cancer. SOURCES OF FIBER  Whole-grain breads and cereals.  Fruits, such as apples, oranges, bananas, berries, prunes, and pears.  Vegetables, such as green peas, carrots, sweet potatoes, beets, broccoli, cabbage, spinach, and artichokes.  Legumes, such split peas, soy, lentils.  Almonds. FIBER CONTENT IN FOODS Starches and Grains / Dietary Fiber (g)  Cheerios, 1 cup / 3 g  Corn Flakes cereal, 1 cup / 0.7 g  Rice crispy treat cereal, 1 cup / 0.3 g  Instant oatmeal (cooked),  cup / 2 g  Frosted wheat cereal, 1 cup / 5.1 g  Brown, long-grain rice (cooked), 1 cup / 3.5 g  White, long-grain rice (cooked), 1 cup / 0.6 g  Enriched macaroni (cooked), 1 cup / 2.5 g Legumes / Dietary Fiber (g)  Baked beans (canned, plain, or  vegetarian),  cup / 5.2 g  Kidney beans (canned),  cup / 6.8 g  Pinto beans (cooked),  cup / 5.5 g Breads and Crackers / Dietary Fiber (g)  Plain or honey graham crackers, 2 squares / 0.7 g  Saltine crackers, 3 squares / 0.3 g  Plain, salted pretzels, 10 pieces / 1.8 g  Whole-wheat bread, 1 slice / 1.9 g  White bread, 1 slice / 0.7 g  Raisin bread, 1 slice / 1.2 g  Plain bagel, 3 oz / 2 g  Flour tortilla, 1 oz / 0.9 g  Corn tortilla, 1 small / 1.5  g  Hamburger or hotdog bun, 1 small / 0.9 g Fruits / Dietary Fiber (g)  Apple with skin, 1 medium / 4.4 g  Sweetened applesauce,  cup / 1.5 g  Banana,  medium / 1.5 g  Grapes, 10 grapes / 0.4 g  Orange, 1 small / 2.3 g  Raisin, 1.5 oz / 1.6 g  Melon, 1 cup / 1.4 g Vegetables / Dietary Fiber (g)  Green beans (canned),  cup / 1.3 g  Carrots (cooked),  cup / 2.3 g  Broccoli (cooked),  cup / 2.8 g  Peas (cooked),  cup / 4.4 g  Mashed potatoes,  cup / 1.6 g  Lettuce, 1 cup / 0.5 g  Corn (canned),  cup / 1.6 g  Tomato,  cup / 1.1 g Document Released: 01/22/2005 Document Revised: 07/24/2011 Document Reviewed: 04/26/2011 [facility] Patient Information 2014 [place].

## 2013-05-01 NOTE — Discharge Summary (Signed)
Naiya Corral, MD, MPH, FACS Trauma: [phone] General Surgery: [phone]  

## 2013-05-01 NOTE — Progress Notes (Signed)
Patient ID: Tracey Fields, female   DOB: 1960-07-30, 53 y.o.   MRN: [id number]   Pt is scheduled for drain injection in IR 05/13/2013 To be at [facility] radiology at 730 am for 800 am appt.

## 2013-05-04 ENCOUNTER — Telehealth (INDEPENDENT_AMBULATORY_CARE_PROVIDER_SITE_OTHER): Payer: Self-pay

## 2013-05-04 ENCOUNTER — Telehealth: Payer: Self-pay | Admitting: Internal Medicine

## 2013-05-04 ENCOUNTER — Telehealth (INDEPENDENT_AMBULATORY_CARE_PROVIDER_SITE_OTHER): Payer: Self-pay | Admitting: General Surgery

## 2013-05-04 NOTE — Telephone Encounter (Signed)
Patient reports that she has a lot of bloating.  She feels creon is no longer working.  She asks if her pain medications could be interfering with creon.  Please advise

## 2013-05-04 NOTE — Telephone Encounter (Signed)
Pt called stating she is still very bloated. No abd pain. No fever. No nausea. Voiding well. Has had 2 small BMs in 24 hours with small amt of flatus. Tolerating normal diet. Pt is not currently taking anything for constipation or bloating. Perc drain functioning well at 30 to 40cc in 24 hours. Pt advised to encourage fluids and walking. Pt advised I will send msg to Dr Grandville Silos for any recommendations [id number]. Pt can be reached at [phone].

## 2013-05-04 NOTE — Telephone Encounter (Signed)
I called patient back regarding her bloating. I advised one half dose milk of magnesia when necessary. I advised her to separate this from her Creon by 2 hours or so. She will call back if things do not get better.

## 2013-05-04 NOTE — Telephone Encounter (Signed)
Left message for patient to call back  

## 2013-05-05 NOTE — Telephone Encounter (Signed)
Pain meds and her diverticulitis may be causing the symptoms She shoud be sure to check with her surgeon about these matters also

## 2013-05-05 NOTE — Telephone Encounter (Signed)
Left message for patient to call back  

## 2013-05-06 NOTE — Telephone Encounter (Signed)
Patient notified

## 2013-05-07 ENCOUNTER — Telehealth (INDEPENDENT_AMBULATORY_CARE_PROVIDER_SITE_OTHER): Payer: Self-pay

## 2013-05-07 NOTE — Telephone Encounter (Signed)
Please find out her pharmacy info and call in: Nystatin 100,000 U/ml, sig:59ml PO swish and swallow QID, Disp: 188ml. No refill. THX!

## 2013-05-07 NOTE — Telephone Encounter (Signed)
Attached rx called to [facility] [phone] per Dr Biagio Borg request. Pt aware and will ck with pharmacy later today to be sure this is filled.

## 2013-05-07 NOTE — Telephone Encounter (Signed)
Pt called in stating the diflucan for her oral thrush is not working. She states it is getting worse. Pt asking if anything else can be called into pharmacy? Advised we will ask Dr Grandville Silos and our office will call her back. Please call pt back at [phone] if so. She is currently out of town and can give pharmacy information where it can be called into.

## 2013-05-13 ENCOUNTER — Ambulatory Visit (HOSPITAL_COMMUNITY): Admit: 2013-05-13 | Payer: Medicaid Other

## 2013-05-13 ENCOUNTER — Ambulatory Visit (HOSPITAL_COMMUNITY)
Admission: RE | Admit: 2013-05-13 | Discharge: 2013-05-13 | Disposition: A | Discharge status: Home / Self Care | Payer: Medicaid Other | Source: Ambulatory Visit | Attending: Radiology | Admitting: Radiology

## 2013-05-13 ENCOUNTER — Telehealth (INDEPENDENT_AMBULATORY_CARE_PROVIDER_SITE_OTHER): Payer: Self-pay

## 2013-05-13 DIAGNOSIS — K5780 Diverticulitis of intestine, part unspecified, with perforation and abscess without bleeding: Secondary | ICD-10-CM

## 2013-05-13 DIAGNOSIS — K5732 Diverticulitis of large intestine without perforation or abscess without bleeding: Secondary | ICD-10-CM

## 2013-05-13 DIAGNOSIS — T85898A Other specified complication of other internal prosthetic devices, implants and grafts, initial encounter: Secondary | ICD-10-CM | POA: Insufficient documentation

## 2013-05-13 DIAGNOSIS — Y849 Medical procedure, unspecified as the cause of abnormal reaction of the patient, or of later complication, without mention of misadventure at the time of the procedure: Secondary | ICD-10-CM | POA: Insufficient documentation

## 2013-05-13 MED ORDER — IOHEXOL 300 MG/ML  SOLN
50.0000 mL | Freq: Once | INTRAMUSCULAR | Status: AC | PRN
  Administered 2013-05-13: 10 mL

## 2013-05-13 MED ORDER — AMOXICILLIN-POT CLAVULANATE 875-125 MG PO TABS: 1.0000 | ORAL_TABLET | Freq: Two times a day (BID) | ORAL | Status: DC

## 2013-05-13 NOTE — Telephone Encounter (Signed)
Called pt back after they called wanting to know results of drain study that was done today. Dr Donne Hazel spoke to the radiologist and advised pt that she will keep the drain in place till she see's Dr Johney Maine next week on 05/21/13. The pt was advised also that she is going to need a CT A/P w/contrast the day before her appt with Dr Johney Maine to be able to tell if the abscess is getting smaller. I advised pt that she will need to stay on the Augmentin 875mg  until she gets the next CT scan and see's Dr Johney Maine next week per Dr Donne Hazel. I e prescribed the Augmentin to [facility]. The pt is also asking about the thrush that she still has on her tongue b/c she has been using the Nystatin that was prescribed by Dr Grandville Silos since 05/07/13. I asked if the pt took her Diflucan that was prescribed when she was discharged and the pt took 2 tabs with one tab left. The pt was told to take the last Diflucan tab once she stopped the Augmentin. I asked Dr Donne Hazel about the thrush and he advised pt to take the last Diflucan tab to see if that would take care of the thrush on the tongue. I advised pt to call us back if this does not help. I advised that I would call her back with the details of the CT scan for next Wednesday before the appt with Dr Johney Maine. The pt understands. I did ask how the pt was feeling and she is doing ok just dissapointed b/c not getting the drain out today. The pt was advised to stay on the low fiber diet per Dr Donne Hazel. The pt asked about eating yogurt daily along with taking probiotics and this would be fine per Dr Donne Hazel.

## 2013-05-17 NOTE — Telephone Encounter (Signed)
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

## 2013-05-18 DIAGNOSIS — K632 Fistula of intestine: Secondary | ICD-10-CM | POA: Insufficient documentation

## 2013-05-18 NOTE — Telephone Encounter (Signed)
Patient states her drain has brown secretion on dressing, Denies temp,redness around drain site. Advised her clean with soap and water around drian and apply new dressing to the area , and to call if she notices and redness aound drian , temp 110.3 or greater. Patient verbalizes understanding.

## 2013-05-20 ENCOUNTER — Ambulatory Visit (HOSPITAL_COMMUNITY)
Admission: RE | Admit: 2013-05-20 | Discharge: 2013-05-20 | Disposition: A | Discharge status: Home / Self Care | Payer: Medicaid Other | Source: Ambulatory Visit | Attending: General Surgery | Admitting: General Surgery

## 2013-05-20 DIAGNOSIS — K5732 Diverticulitis of large intestine without perforation or abscess without bleeding: Secondary | ICD-10-CM

## 2013-05-20 DIAGNOSIS — R1032 Left lower quadrant pain: Secondary | ICD-10-CM | POA: Insufficient documentation

## 2013-05-20 DIAGNOSIS — K630 Abscess of intestine: Secondary | ICD-10-CM | POA: Diagnosis not present

## 2013-05-20 MED ORDER — IOHEXOL 300 MG/ML  SOLN
80.0000 mL | Freq: Once | INTRAMUSCULAR | Status: AC | PRN
  Administered 2013-05-20: 80 mL via INTRAVENOUS

## 2013-05-21 ENCOUNTER — Ambulatory Visit (INDEPENDENT_AMBULATORY_CARE_PROVIDER_SITE_OTHER): Payer: Medicaid Other | Admitting: Surgery

## 2013-05-21 ENCOUNTER — Encounter (INDEPENDENT_AMBULATORY_CARE_PROVIDER_SITE_OTHER): Payer: Self-pay | Admitting: Surgery

## 2013-05-21 VITALS — BP 98/62 | HR 72 | Temp 97.8°F | Resp 14 | Ht 65.0 in | Wt 138.2 lb

## 2013-05-21 DIAGNOSIS — K5720 Diverticulitis of large intestine with perforation and abscess without bleeding: Secondary | ICD-10-CM

## 2013-05-21 DIAGNOSIS — K5732 Diverticulitis of large intestine without perforation or abscess without bleeding: Secondary | ICD-10-CM

## 2013-05-21 DIAGNOSIS — F17200 Nicotine dependence, unspecified, uncomplicated: Secondary | ICD-10-CM

## 2013-05-21 DIAGNOSIS — F411 Generalized anxiety disorder: Secondary | ICD-10-CM

## 2013-05-21 DIAGNOSIS — Z720 Tobacco use: Secondary | ICD-10-CM

## 2013-05-21 DIAGNOSIS — K630 Abscess of intestine: Secondary | ICD-10-CM

## 2013-05-21 MED ORDER — AMOXICILLIN-POT CLAVULANATE 875-125 MG PO TABS: 1.0000 | ORAL_TABLET | Freq: Two times a day (BID) | ORAL | Status: DC

## 2013-05-21 MED ORDER — SODIUM CHLORIDE 0.9 % IJ SOLN: 10.0000 mL | INTRAMUSCULAR | Status: DC | PRN

## 2013-05-21 NOTE — Progress Notes (Signed)
Subjective:     Patient ID: Tracey Fields, female   DOB: 1960-03-09, 53 y.o.   MRN: [id number]  HPI  Note: This dictation was prepared with Dragon/digital dictation along with [facility] technology. Any transcriptional errors that result from this process are unintentional.       Tracey Fields  1960-04-06 [id number]  Patient Care Team: Biagio Borg, MD as PCP - General  This patient is a 53 y.o.female who presents today for surgical evaluation at the request of Dr. Donne Hazel.   Reason for visit: Follow up on diverticulitis with abscess status post hospital admission and drain  Female with recurrent episodes of diverticulitis treated with antibiotics in the past.  Developed more severe attack with perforation and abscess.  Was admitted.  Drain placed.  Transitioned to outpatient therapy and discharged home.  Discharge 2 weeks ago.  She comes here for followup.  She was due to follow up with Dr. Donne Hazel but due to family emergency he is not available so I am seeing the patient.    She continues to smoke.  Her pain is gone down.  She has been having a bland diet.  Some mild loose stools but not severe.  She has a history of chronic pancreatitis and tries to keep alcohol minimize.  Followed by her gastroenterologist for that as well.  She is hoping to avoid surgery.  She is afraid of a bag.  Wishes to avoid of colostomy.  She comes today with her niece.    Patient Active Problem List   Diagnosis Date Noted  . Oral candida 05/01/2013  . Diverticulitis of large intestine with perforation and abscess 04/25/2013  . Alcohol intake above recommended sensible limits with complication 54/27/0623  . Ventral hernia 10/01/2012  . Chronic pancreatitis 07/29/2012  . COPD (chronic obstructive pulmonary disease) 04/01/2012  . 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 Insomnia 04/01/2012  . Preventative health care 10/19/2010  . Impaired glucose tolerance 10/19/2010  . Smoking 10/19/2010  . BACK PAIN  01/12/2009  . HOARSENESS, CHRONIC 08/13/2008  . FATIGUE 07/14/2008  . DYSPNEA 06/02/2008  . DEPRESSION 12/29/2007  . HYPERTENSION 12/29/2007  . DIVERTICULOSIS, COLON 12/29/2007  . ANXIETY 09/21/2006  . 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 Irritable bowel syndrome 09/21/2006  . COUGH, CHRONIC 09/21/2006  . HEMORRHOIDS, HX OF 09/21/2006    Past Medical History  Diagnosis Date  . Abdominal pain, left lower quadrant 02/24/2010  . ANXIETY 09/21/2006  . BACK PAIN 01/12/2009  . COUGH, CHRONIC 09/21/2006  . DEPRESSION 12/29/2007  . DIVERTICULOSIS, COLON 12/29/2007  . DYSPNEA 06/02/2008  . FATIGUE 07/14/2008  . HEMORRHOIDS, HX OF 09/21/2006  . HOARSENESS, CHRONIC 08/13/2008  . HYPERTENSION 12/29/2007  . Irritable bowel syndrome 09/21/2006  . 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 SORE THROAT 07/14/2008  . Wheezing 05/18/2009  . Impaired glucose tolerance 10/19/2010  . Chronic pancreatitis 07/29/2012    Past Surgical History  Procedure Laterality Date  . Fractured jaw  1980    required wiring  . Colonoscopy    . Upper gastrointestinal endoscopy      History   Social History  . Marital Status: Divorced    Spouse Name: N/A    Number of Children: 2  . Years of Education: N/A   Occupational History  . (UNTIL 3PM)   . independent [employer]    Social History Main Topics  . Smoking status: Current Every Day Smoker -- 1.00 packs/day    Types: Cigarettes  . Smokeless tobacco: Never Used  Comment: 1 ppd, patient has been counseled to quit smoking 08/13/08  . Alcohol Use: Yes     Comment: social  . Drug Use: No  . Sexual Activity: Yes   Other Topics Concern  . Not on file   Social History Narrative  . No narrative on file    Family History  Problem Relation Age of Onset  . Lung cancer Father   . Diabetes Maternal Grandfather   . Colon polyps Other   . Hypertension Mother   . Prostate cancer Father     Current Outpatient Prescriptions  Medication Sig  Dispense Refill  . acetaminophen (TYLENOL) 500 MG tablet Take 1,000 mg by mouth 2 (two) times daily as needed (pain).      Marland Kitchen ALPRAZolam (XANAX) 0.5 MG tablet Take 0.25 mg by mouth 3 (three) times daily as needed for anxiety.       Marland Kitchen amoxicillin-clavulanate (AUGMENTIN) 875-125 MG per tablet Take 1 tablet by mouth 2 (two) times daily.  28 tablet  0  . aspirin EC 81 MG tablet Take 81 mg by mouth at bedtime.       Marland Kitchen b complex vitamins capsule Take 1 capsule by mouth at bedtime.       . cetirizine (ZYRTEC) 10 MG tablet Take 5 mg by mouth at bedtime.       Marland Kitchen desvenlafaxine (PRISTIQ) 100 MG 24 hr tablet Take 200 mg by mouth daily.      . fluconazole (DIFLUCAN) 200 MG tablet Take 1 tablet (200 mg total) by mouth daily.  3 tablet  0  . naproxen sodium (ALEVE) 220 MG tablet Take 440 mg by mouth daily as needed (pain).      . Pancrelipase, Lip-Prot-Amyl, (CREON) 36000 UNITS CPEP Take 36,000 Units by mouth 3 (three) times daily.      . propranolol (INDERAL) 10 MG tablet Take 10 mg by mouth daily.      Marland Kitchen zolpidem (AMBIEN) 10 MG tablet Take 5 mg by mouth at bedtime as needed for sleep.       No current facility-administered medications for this visit.     Allergies  Allergen Reactions  . Anesthetics, Amide Other (See Comments)    System shut down when 53 years old having jaw work done  . Succinylcholine Chloride Other (See Comments)    Major organs stopped working    BP 98/62  Pulse 72  Temp(Src) 97.8 F (36.6 C) (Temporal)  Resp 14  Ht 5\' 5"  (1.651 m)  Wt 138 lb 3.2 oz (62.687 kg)  BMI 23.00 kg/m2  Ct Abdomen Pelvis W Contrast  05/20/2013   CLINICAL DATA:  History of left lower quadrant diverticular abscess with persistent drainage from the catheter. Intermittent left lower quadrant pain.  EXAM: CT ABDOMEN AND PELVIS WITH CONTRAST  TECHNIQUE: Multidetector CT imaging of the abdomen and pelvis was performed using the standard protocol following bolus administration of intravenous contrast.   CONTRAST:  66mL OMNIPAQUE IOHEXOL 300 MG/ML  SOLN  COMPARISON:  CT IMAGE GUIDED DRAINAGE BY PERCUTANEOUS CATHETER dated 04/26/2013; CT ABD/PELVIS W CM dated 04/25/2013  FINDINGS: Lung bases show scattered scarring or atelectasis. Heart size normal. No pericardial or pleural effusion.  Liver, gallbladder, adrenal glands, kidneys and spleen are unremarkable. Calcifications are seen in the pancreatic head and uncinate process. Pancreas, stomach, small bowel and proximal colon are otherwise unremarkable. Minimal residual soft tissue thickening adjacent to the descending colon, at the site of indwelling percutaneous drain (series 2, image 42), without measurable  fluid collection. Slight thickening of the posterior wall of the descending duodenum in association. Remainder of the colon is unremarkable.  Uterus and ovaries are visualized. No pathologically enlarged lymph nodes. No free fluid. Atherosclerotic calcification of the arterial vasculature without abdominal aortic aneurysm. No worrisome lytic or sclerotic lesions. Degenerative changes are seen in the spine with minimal retrolisthesis of L5 on S1.  IMPRESSION: 1. Minimal residual soft tissue thickening and stranding in the left lower quadrant, adjacent to the descending duodenum, at the site of indwelling percutaneous drain. No measurable fluid collection. 2. Chronic calcific pancreatitis.   Electronically Signed   By: Lorin Picket M.D.   On: 05/20/2013 13:16   Ct Abdomen Pelvis W Contrast  04/25/2013   CLINICAL DATA:  Left lower quadrant abdominal pain 5 days  EXAM: CT ABDOMEN AND PELVIS WITH CONTRAST  TECHNIQUE: Multidetector CT imaging of the abdomen and pelvis was performed using the standard protocol following bolus administration of intravenous contrast.  CONTRAST:  53mL OMNIPAQUE IOHEXOL 300 MG/ML  SOLN  COMPARISON:  CT ABD/PELVIS W CM dated 07/30/2012  FINDINGS: Lung bases are clear.  No pericardial fluid.  No focal hepatic lesion. The gallbladder,  pancreas, spleen, adrenal glands, kidneys demonstrate no acute findings. There is calcifications of the pancreatic head similar prior consistent with chronic pancreatitis.  The stomach, small bowel, and cecum normal. There is a fluid collection along the posterior wall of the descending colon measuring 3.7 x 3.5 cm. This has a thin enhancing rim and extends inferiorly along the pericolic gutter. This finding is most consistent with ruptured diverticulitis with abscess formation. The abscess extends to involve the left psoas muscle and iliacus muscle to a minimal degree (image 50 series 2). No evidence of bowel obstruction. No evidence of frank leak of the oral contrast. Contrast flows entirety of the colon to the rectum.  Abdominal or is normal caliber. No retroperitoneal periportal lymphadenopathy.  No free fluid the pelvis. Uterus and ovaries are normal. No pelvic lymphadenopathy. No aggressive osseous lesion.  IMPRESSION: 1. Abscess along the descending colon is most consistent with ruptured diverticulitis with abscess formation. 2. Abscess extends along the left pericolic gutter to involve the left psoas and iliacus muscle. 3. No evidence of active leak of oral contrast. 4. No significant intraperitoneal free air. Findings conveyed toJenniffer Fields, [place] 04/25/2013  at21:55.   Electronically Signed   By: Suzy Bouchard M.D.   On: 04/25/2013 21:56   Ir Sinus/fist Tube Chk-non Gi  05/13/2013   INDICATION: History of left-sided perforated diverticular abscess, post CT-guided percutaneous drainage catheter placement (04/26/2013).  EXAM: SINUS TRACT INJECTION/FISTULOGRAM  COMPARISON:  CT IMAGE GUIDED DRAINAGE BY PERCUTANEOUS CATHETER dated 04/26/2013; CT ABD/PELVIS W CM dated 04/25/2013  MEDICATIONS: None.  CONTRAST:  70mL OMNIPAQUE IOHEXOL 300 MG/ML  SOLN  FLUOROSCOPY TIME:  48 seconds.  COMPLICATIONS: None immediate  TECHNIQUE: A preprocedural spot fluoroscopic image was obtained of the existing left lower  quadrant abscess drainage catheter.  Multiple spot fluoroscopic and radiographic images were obtained in various obliquities following the injection of a small amount of contrast via the existing percutaneous drainage catheter.  Images were reviewed and the procedure was terminated. The catheter was flushed with a small amount of saline and re-connected to a gravity bag. A dressing was placed. The patient tolerated the procedure well without immediate postprocedural complication.  FINDINGS: Preprocedural spot fluoroscopic image demonstrates grossly unchanged positioning of the abscess drainage catheter within the left lower abdominal quadrant.  Contrast injection demonstrates brisk  opacification of the descending colon via potentially 2 discrete fistulous tracts with the adjacent distal descending colon.  IMPRESSION: Fluoroscopic guided drain injection demonstrates persistent communication with the end of the drainage catheter and the adjacent descending colon, potentially via 2 discrete fistulous tracts. As such, the drainage catheter was not removed.  Critical Value/emergent results were called by telephone at the time of interpretation on 05/13/2013 at 1:34 PM to Dr. Donne Hazel, who verbally acknowledged these results.   Electronically Signed   By: Sandi Mariscal M.D.   On: 05/13/2013 13:36   Ct Image Guided Drainage By Percutaneous Catheter  04/26/2013   CLINICAL DATA:  Diverticular abscess in the left lower quadrant  EXAM: CT IMAGE GUIDED DRAINAGE BY PERCUTANEOUS CATHETER  FLUOROSCOPY TIME:  None  MEDICATIONS AND MEDICAL HISTORY: Versed four mg, Fentanyl 100 mcg.  Additional Medications: None.  ANESTHESIA/SEDATION: Moderate sedation time: 40 minutes  CONTRAST:  None.  PROCEDURE: The procedure, risks, benefits, and alternatives were explained to the patient. Questions regarding the procedure were encouraged and answered. The patient understands and consents to the procedure.  The left lower quadrant was prepped  with Betadine in a sterile fashion, and a sterile drape was applied covering the operative field. A sterile gown and sterile gloves were used for the procedure.  1% lidocaine was utilized for local anesthesia. Under CT guidance, an 18 gauge needle was inserted into the left lower quadrant abscess posterior to the descending colon. It was removed over an Amplatz wire. A 12 French dilator followed by a 12 [place] drain were inserted. It was looped and string fixed and sewn to the skin. Frank pus was aspirated  FINDINGS: Images demonstrate 13 French drain placement into a diverticular abscess. 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  COMPLICATIONS: None  IMPRESSION: Successful left lower quadrant abscess drain.   Electronically Signed   By: Maryclare Bean M.D.   On: 04/26/2013 10:28     Review of Systems  Constitutional: Negative for fever, chills and diaphoresis.  HENT: Negative for ear pain, sore throat and trouble swallowing.   Eyes: Negative for photophobia and visual disturbance.  Respiratory: Negative for cough and choking.   Cardiovascular: Negative for chest pain and palpitations.  Gastrointestinal: Negative for nausea, vomiting, abdominal pain, diarrhea, constipation, anal bleeding and rectal pain.  Genitourinary: Negative for dysuria, frequency and difficulty urinating.  Musculoskeletal: Negative for gait problem and myalgias.  Skin: Negative for color change, pallor and rash.  Neurological: Negative for dizziness, speech difficulty, weakness and numbness.  Hematological: Negative for adenopathy.  Psychiatric/Behavioral: Negative for confusion and agitation. The patient is not nervous/anxious.        Objective:   Physical Exam  Constitutional: She is oriented to person, place, and time. She appears well-developed and well-nourished. No distress.  HENT:  Head: Normocephalic.  Mouth/Throat: Oropharynx is clear and moist. No oropharyngeal exudate.   Eyes: Conjunctivae and EOM are normal. Pupils are equal, round, and reactive to light. No scleral icterus.  Neck: Normal range of motion. Neck supple. No tracheal deviation present.  Cardiovascular: Normal rate, regular rhythm and intact distal pulses.   Pulmonary/Chest: Effort normal and breath sounds normal. No stridor. No respiratory distress. She exhibits no tenderness.  Abdominal: Soft. She exhibits no distension and no mass. There is no tenderness. Hernia confirmed negative in the right inguinal area and confirmed negative in the left inguinal area.  Genitourinary: No vaginal discharge found.  Musculoskeletal: Normal range of motion. She exhibits no  tenderness.       Right elbow: She exhibits normal range of motion.       Left elbow: She exhibits normal range of motion.       Right wrist: She exhibits normal range of motion.       Left wrist: She exhibits normal range of motion.       Right hand: Normal strength noted.       Left hand: Normal strength noted.  Lymphadenopathy:       Head (right side): No posterior auricular adenopathy present.       Head (left side): No posterior auricular adenopathy present.    She has no cervical adenopathy.    She has no axillary adenopathy.       Right: No inguinal adenopathy present.       Left: No inguinal adenopathy present.  Neurological: She is alert and oriented to person, place, and time. No cranial nerve deficit. She exhibits normal muscle tone. Coordination normal.  Skin: Skin is warm and dry. No rash noted. She is not diaphoretic. No erythema.  Psychiatric: She has a normal mood and affect. Her behavior is normal. Judgment and thought content normal.       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  Would plan surgery in the 6 weeks from drain placement to allow the inflammatory response to update = Mid May.  Patient would benefit from colonoscopy done just before surgery to rule out malignancy.  Dr. Carlean Purl aware.  Ideally, would have the patient a bowel prep & colonoscopy the day before surgery and then have surgery the next day.  Plan minimally invasive approach with robotics vs. Laparoscopic approach and open backup.  We will plan splenic flexure mobilization since this seems to be at the descending/sigmoid junction.:  The anatomy & physiology of the digestive tract was discussed.  The pathophysiology of the colon was discussed.  Natural history risks without surgery was discussed.   I feel the risks of no intervention will lead to serious problems that outweigh the operative risks; therefore, I recommended a partial colectomy to remove the pathology.  Minimally invasive (Robotic/Laparoscopic) & open techniques were discussed.   Risks such as bleeding, infection, abscess, leak, reoperation, possible ostomy, hernia, heart attack, death, and other risks were discussed.  I noted a good likelihood this will help address the problem.   Goals of post-operative recovery were discussed as well.   Need for bowel regimen and healthy physical activity to optimize recovery noted as well. We will work to minimize complications.  Educational materials were given as well.  Questions were answered.  The patient expresses understanding & wishes to proceed with surgery.   She had numerous questions about diet and other issues.  I spent an extra 30 minutes talking to her about it at length.  Total visit over an hour.

## 2013-05-21 NOTE — Patient Instructions (Addendum)
Please consider the recommendations that we have given you today:  Continue your antibiotics and your drain until you have surgery. 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  90% chance of avoiding a temporary colostomy.  You will need a colonoscopy done the day before surgery.  Your gastroenterologist, Dr. Leone Payor, and I will coordinate doing these procedures day-by-day.  See the Handout(s) we have given you.  Please call our office at [phone] if you wish to schedule surgery or if you have further questions / concerns.   DRAIN CARE:   You have a closed bulb drain to help you heal.  A bulb drain is a small, plastic reservoir which creates a gentle suction. It is used to remove excess fluid from a surgical wound. The color and amount of fluid will vary. Immediately after surgery, the fluid is bright red. It may gradually change to a yellow color. When the amount decreases to about 1 or 2 tablespoons (15 to 30 cc) per 24 hours, your caregiver will usually remove it.  DAILY CARE  Keep the bulb compressed at all times, except while emptying it. The compression creates suction.   Keep sites where the tubes enter the skin dry and covered with a light bandage (dressing).   Tape the tubes to your skin, 1 to 2 inches below the insertion sites, to keep from pulling on your stitches. Tubes are stitched in place and will not slip out.   Pin the bulb to your shirt (not to your pants) with a safety pin.   For the first few days after surgery, there usually is more fluid in the bulb. Empty the bulb whenever it becomes half full because the bulb does not create enough suction if it is too full. Include this amount in your 24 hour totals.   When the amount of drainage decreases, empty the bulb at the same time every day. Write down  the amounts and the 24 hour totals. Your caregiver will want to know them. This helps your caregiver know when the tubes can be removed.   (We anticipate removing the drain in 1-3 weeks, depending on when the output is <32mL a day for 2+ days)  If there is drainage around the tube sites, change dressings and keep the area dry. If you see a clot in the tube, leave it alone. However, if the tube does not appear to be draining, let your caregiver know.  TO EMPTY THE BULB  Open the stopper to release suction.   Holding the stopper out of the way, pour drainage into the measuring cup that was sent home with you.   Measure and write down the amount. If there are 2 bulbs, note the amount of drainage from bulb 1 or bulb 2 and keep the totals separate. Your caregiver will want to know which tube is draining more.   Compress the bulb by folding it in half.   Replace the stopper.   Check the tape that holds the tube to your skin, and pin the bulb to your shirt.  SEEK MEDICAL CARE IF:  The drainage develops a bad odor.   You have an oral temperature above 102 F (38.9 C).   The amount of drainage from your wound suddenly increases or decreases.   You accidentally pull out your drain.   You have any other  questions or concerns.  MAKE SURE YOU:   Understand these instructions.   Will watch your condition.   Will get help right away if you are not doing well or get worse.     Call our office if you have any questions about your drain. [phone]    Diverticulitis A diverticulum is a small pouch or sac on the colon. Diverticulosis is the presence of these diverticula on the colon. Diverticulitis is the irritation (inflammation) or infection of diverticula. CAUSES  The colon and its diverticula contain bacteria. If food particles block the tiny opening to a diverticulum, the bacteria inside can grow and cause an increase in pressure. This leads to infection and inflammation and is  called diverticulitis. SYMPTOMS   Abdominal pain and tenderness. Usually, the pain is located on the left side of your abdomen. However, it could be located elsewhere.  Fever.  Bloating.  Feeling sick to your stomach (nausea).  Throwing up (vomiting).  Abnormal stools. DIAGNOSIS  Your caregiver will take a history and perform a physical exam. Since many things can cause abdominal pain, other tests may be necessary. Tests may include:  Blood tests.  Urine tests.  X-ray of the abdomen.  CT scan of the abdomen. Sometimes, surgery is needed to determine if diverticulitis or other conditions are causing your symptoms. TREATMENT  Most of the time, you can be treated without surgery. Treatment includes:  Resting the bowels by only having liquids for a few days. As you improve, you will need to eat a low-fiber diet.  Intravenous (IV) fluids if you are losing body fluids (dehydrated).  Antibiotic medicines that treat infections may be given.  Pain and nausea medicine, if needed.  Surgery if the inflamed diverticulum has burst. HOME CARE INSTRUCTIONS   Try a clear liquid diet (broth, tea, or water for as long as directed by your caregiver). You may then gradually begin a low-fiber diet as tolerated.  A low-fiber diet is a diet with less than 10 grams of fiber. Choose the foods below to reduce fiber in the diet:  White breads, cereals, rice, and pasta.  Cooked fruits and vegetables or soft fresh fruits and vegetables without the skin.  Ground or well-cooked tender beef, ham, veal, lamb, pork, or poultry.  Eggs and seafood.  After your diverticulitis symptoms have improved, your caregiver may put you on a high-fiber diet. A high-fiber diet includes 14 grams of fiber for every 1000 calories consumed. For a standard 2000 calorie diet, you would need 28 grams of fiber. Follow these diet guidelines to help you increase the fiber in your diet. It is important to slowly increase the  amount fiber in your diet to avoid gas, constipation, and bloating.  Choose whole-grain breads, cereals, pasta, and brown rice.  Choose fresh fruits and vegetables with the skin on. Do not overcook vegetables because the more vegetables are cooked, the more fiber is lost.  Choose more nuts, seeds, legumes, dried peas, beans, and lentils.  Look for food products that have greater than 3 grams of fiber per serving on the Nutrition Facts label.  Take all medicine as directed by your caregiver.  If your caregiver has given you a follow-up appointment, it is very important that you go. Not going could result in lasting (chronic) or permanent injury, pain, and disability. If there is any problem keeping the appointment, call to reschedule. SEEK MEDICAL CARE IF:   Your pain does not improve.  You have a hard time advancing  your diet beyond clear liquids.  Your bowel movements do not return to normal. SEEK IMMEDIATE MEDICAL CARE IF:   Your pain becomes worse.  You have an oral temperature above 102 F (38.9 C), not controlled by medicine.  You have repeated vomiting.  You have bloody or black, tarry stools.  Symptoms that brought you to your caregiver become worse or are not getting better. MAKE SURE YOU:   Understand these instructions.  Will watch your condition.  Will get help right away if you are not doing well or get worse. Document Released: 11/01/2004 Document Revised: 04/16/2011 Document Reviewed: 02/27/2010 [facility] Patient Information 2014 [place].  High-Fiber Diet Fiber is found in fruits, vegetables, and grains. A high-fiber diet encourages the addition of more whole grains, legumes, fruits, and vegetables in your diet. The recommended amount of fiber for adult males is 38 g per day. For adult females, it is 25 g per day. Pregnant and lactating women should get 28 g of fiber per day. If you have a digestive or bowel problem, ask your caregiver for advice before  adding high-fiber foods to your diet. Eat a variety of high-fiber foods instead of only a select few type of foods.  PURPOSE  To increase stool bulk.  To make bowel movements more regular to prevent constipation.  To lower cholesterol.  To prevent overeating. WHEN IS THIS DIET USED?  It may be used if you have constipation and hemorrhoids.  It may be used if you have uncomplicated diverticulosis (intestine condition) and irritable bowel syndrome.  It may be used if you need help with weight management.  It may be used if you want to add it to your diet as a protective measure against atherosclerosis, diabetes, and cancer. SOURCES OF FIBER  Whole-grain breads and cereals.  Fruits, such as apples, oranges, bananas, berries, prunes, and pears.  Vegetables, such as green peas, carrots, sweet potatoes, beets, broccoli, cabbage, spinach, and artichokes.  Legumes, such split peas, soy, lentils.  Almonds. FIBER CONTENT IN FOODS Starches and Grains / Dietary Fiber (g)  Cheerios, 1 cup / 3 g  Corn Flakes cereal, 1 cup / 0.7 g  Rice crispy treat cereal, 1 cup / 0.3 g  Instant oatmeal (cooked),  cup / 2 g  Frosted wheat cereal, 1 cup / 5.1 g  Brown, long-grain rice (cooked), 1 cup / 3.5 g  White, long-grain rice (cooked), 1 cup / 0.6 g  Enriched macaroni (cooked), 1 cup / 2.5 g Legumes / Dietary Fiber (g)  Baked beans (canned, plain, or vegetarian),  cup / 5.2 g  Kidney beans (canned),  cup / 6.8 g  Pinto beans (cooked),  cup / 5.5 g Breads and Crackers / Dietary Fiber (g)  Plain or honey graham crackers, 2 squares / 0.7 g  Saltine crackers, 3 squares / 0.3 g  Plain, salted pretzels, 10 pieces / 1.8 g  Whole-wheat bread, 1 slice / 1.9 g  White bread, 1 slice / 0.7 g  Raisin bread, 1 slice / 1.2 g  Plain bagel, 3 oz / 2 g  Flour tortilla, 1 oz / 0.9 g  Corn tortilla, 1 small / 1.5 g  Hamburger or hotdog bun, 1 small / 0.9 g Fruits / Dietary Fiber  (g)  Apple with skin, 1 medium / 4.4 g  Sweetened applesauce,  cup / 1.5 g  Banana,  medium / 1.5 g  Grapes, 10 grapes / 0.4 g  Orange, 1 small / 2.3 g  Raisin, 1.5 oz / 1.6 g  Melon, 1 cup / 1.4 g Vegetables / Dietary Fiber (g)  Green beans (canned),  cup / 1.3 g  Carrots (cooked),  cup / 2.3 g  Broccoli (cooked),  cup / 2.8 g  Peas (cooked),  cup / 4.4 g  Mashed potatoes,  cup / 1.6 g  Lettuce, 1 cup / 0.5 g  Corn (canned),  cup / 1.6 g  Tomato,  cup / 1.1 g Document Released: 01/22/2005 Document Revised: 07/24/2011 Document Reviewed: 04/26/2011 [facility] Patient Information 2014 [place], [place].    GETTING TO GOOD BOWEL HEALTH. Irregular bowel habits such as constipation and diarrhea can lead to many problems over time.  Having one soft bowel movement a day is the most important way to prevent further problems.  The anorectal canal is designed to handle stretching and feces to safely manage our ability to get rid of solid waste (feces, poop, stool) out of our body.  BUT, hard constipated stools can act like ripping concrete bricks and diarrhea can be a burning fire to this very sensitive area of our body, causing inflamed hemorrhoids, anal fissures, increasing risk is perirectal abscesses, abdominal pain/bloating, an making irritable bowel worse.     The goal: ONE SOFT BOWEL MOVEMENT A DAY!  To have soft, regular bowel movements:    Drink at least 8 tall glasses of water a day.     Take plenty of fiber.  Fiber is the undigested part of plant food that passes into the colon, acting s "natures broom" to encourage bowel motility and movement.  Fiber can absorb and hold large amounts of water. This results in a larger, bulkier stool, which is soft and easier to pass. Work gradually over several weeks up to 6 servings a day of fiber (25g a day even more if needed) in the form of: o Vegetables -- Root (potatoes, carrots, turnips), leafy green (lettuce, salad greens,  celery, spinach), or cooked high residue (cabbage, broccoli, etc) o Fruit -- Fresh (unpeeled skin & pulp), Dried (prunes, apricots, cherries, etc ),  or stewed ( applesauce)  o Whole grain breads, pasta, etc (whole wheat)  o Bran cereals    Bulking Agents -- This type of water-retaining fiber generally is easily obtained each day by one of the following:  o Psyllium bran -- The psyllium plant is remarkable because its ground seeds can retain so much water. This product is available as Metamucil, Konsyl, Effersyllium, Per Diem Fiber, or the less expensive generic preparation in drug and health food stores. Although labeled a laxative, it really is not a laxative.  o Methylcellulose -- This is another fiber derived from wood which also retains water. It is available as Citrucel. o Polyethylene Glycol - and "artificial" fiber commonly called Miralax or Glycolax.  It is helpful for people with gassy or bloated feelings with regular fiber o Flax Seed - a less gassy fiber than psyllium   No reading or other relaxing activity while on the toilet. If bowel movements take longer than 5 minutes, you are too constipated   AVOID CONSTIPATION.  High fiber and water intake usually takes care of this.  Sometimes a laxative is needed to stimulate more frequent bowel movements, but    Laxatives are not a good long-term solution as it can wear the colon out. o Osmotics (Milk of Magnesia, Fleets phosphosoda, Magnesium citrate, MiraLax, GoLytely) are safer than  o Stimulants (Senokot, Castor Oil, Dulcolax, Ex Lax)    o  Do not take laxatives for more than 7days in a row.    IF SEVERELY CONSTIPATED, try a Bowel Retraining Program: o Do not use laxatives.  o Eat a diet high in roughage, such as bran cereals and leafy vegetables.  o Drink six (6) ounces of prune or apricot juice each morning.  o Eat two (2) large servings of stewed fruit each day.  o Take one (1) heaping tablespoon of a psyllium-based bulking agent  twice a day. Use sugar-free sweetener when possible to avoid excessive calories.  o Eat a normal breakfast.  o Set aside 15 minutes after breakfast to sit on the toilet, but do not strain to have a bowel movement.  o If you do not have a bowel movement by the third day, use an enema and repeat the above steps.    Controlling diarrhea o Switch to liquids and simpler foods for a few days to avoid stressing your intestines further. o Avoid dairy products (especially milk & ice cream) for a short time.  The intestines often can lose the ability to digest lactose when stressed. o Avoid foods that cause gassiness or bloating.  Typical foods include beans and other legumes, cabbage, broccoli, and dairy foods.  Every person has some sensitivity to other foods, so listen to our body and avoid those foods that trigger problems for you. o Adding fiber (Citrucel, Metamucil, psyllium, Miralax) gradually can help thicken stools by absorbing excess fluid and retrain the intestines to act more normally.  Slowly increase the dose over a few weeks.  Too much fiber too soon can backfire and cause cramping & bloating. o Probiotics (such as active yogurt, Align, etc) may help repopulate the intestines and colon with normal bacteria and calm down a sensitive digestive tract.  Most studies show it to be of mild help, though, and such products can be costly. o Medicines:   Bismuth subsalicylate (ex. Kayopectate, Pepto Bismol) every 30 minutes for up to 6 doses can help control diarrhea.  Avoid if pregnant.   Loperamide (Immodium) can slow down diarrhea.  Start with two tablets (4mg  total) first and then try one tablet every 6 hours.  Avoid if you are having fevers or severe pain.  If you are not better or start feeling worse, stop all medicines and call your doctor for advice o Call your doctor if you are getting worse or not better.  Sometimes further testing (cultures, endoscopy, X-ray studies, bloodwork, etc) may be needed  to help diagnose and treat the cause of the diarrhea.  STOP SMOKING!  We strongly recommend that you stop smoking.  Smoking increases the risk of surgery including infection in the form of an open wound, pus formation, abscess, hernia at an incision on the abdomen, etc.  You have an increased risk of other MAJOR complications such as stroke, heart attack, forming clots in the leg and/or lungs, and death.    Smoking Cessation Quitting smoking is important to your health and has many advantages. However, it is not always easy to quit since nicotine is a very addictive drug. Often times, people try 3 times or more before being able to quit. This document explains the best ways for you to prepare to quit smoking. Quitting takes hard work and a lot of effort, but you can do it. ADVANTAGES OF QUITTING SMOKING  You will live longer, feel better, and live better.  Your body will feel the impact of quitting smoking almost immediately.  Within 20 minutes,  blood pressure decreases. Your pulse returns to its normal level.  After 8 hours, carbon monoxide levels in the blood return to normal. Your oxygen level increases.  After 24 hours, the chance of having a heart attack starts to decrease. Your breath, hair, and body stop smelling like smoke.  After 48 hours, damaged nerve endings begin to recover. Your sense of taste and smell improve.  After 72 hours, the body is virtually free of nicotine. Your bronchial tubes relax and breathing becomes easier.  After 2 to 12 weeks, lungs can hold more air. Exercise becomes easier and circulation improves.  The risk of having a heart attack, stroke, cancer, or lung disease is greatly reduced.  After 1 year, the risk of coronary heart disease is cut in half.  After 5 years, the risk of stroke falls to the same as a nonsmoker.  After 10 years, the risk of lung cancer is cut in half and the risk of other cancers decreases significantly.  After 15 years, the  risk of coronary heart disease drops, usually to the level of a nonsmoker.  If you are pregnant, quitting smoking will improve your chances of having a healthy baby.  The people you live with, especially any children, will be healthier.  You will have extra money to spend on things other than cigarettes. QUESTIONS TO THINK ABOUT BEFORE ATTEMPTING TO QUIT You may want to talk about your answers with your caregiver.  Why do you want to quit?  If you tried to quit in the past, what helped and what did not?  What will be the most difficult situations for you after you quit? How will you plan to handle them?  Who can help you through the tough times? Your family? Friends? A caregiver?  What pleasures do you get from smoking? What ways can you still get pleasure if you quit? Here are some questions to ask your caregiver:  How can you help me to be successful at quitting?  What medicine do you think would be best for me and how should I take it?  What should I do if I need more help?  What is smoking withdrawal like? How can I get information on withdrawal? GET READY  Set a quit date.  Change your environment by getting rid of all cigarettes, ashtrays, matches, and lighters in your home, car, or work. Do not let people smoke in your home.  Review your past attempts to quit. Think about what worked and what did not. GET SUPPORT AND ENCOURAGEMENT You have a better chance of being successful if you have help. You can get support in many ways.  Tell your family, friends, and co-workers that you are going to quit and need their support. Ask them not to smoke around you.  Get individual, group, or telephone counseling and support. Programs are available at [employer] and health centers. Call your local health department for information about programs in your area.  Spiritual beliefs and practices may help some smokers quit.  Download a "quit meter" on your computer to keep track  of quit statistics, such as how long you have gone without smoking, cigarettes not smoked, and money saved.  Get a self-help book about quitting smoking and staying off of tobacco. [place] yourself from urges to smoke. Talk to someone, go for a walk, or occupy your time with a task.  Change your normal routine. Take a different route to work. Drink tea  instead of coffee. Eat breakfast in a different place.  Reduce your stress. Take a hot bath, exercise, or read a book.  Plan something enjoyable to do every day. Reward yourself for not smoking.  Explore interactive web-based programs that specialize in helping you quit. GET MEDICINE AND USE IT CORRECTLY Medicines can help you stop smoking and decrease the urge to smoke. Combining medicine with the above behavioral methods and support can greatly increase your chances of successfully quitting smoking.  Nicotine replacement therapy helps deliver nicotine to your body without the negative effects and risks of smoking. Nicotine replacement therapy includes nicotine gum, lozenges, inhalers, nasal sprays, and skin patches. Some may be available over-the-counter and others require a prescription.  Antidepressant medicine helps people abstain from smoking, but how this works is unknown. This medicine is available by prescription.  Nicotinic receptor partial agonist medicine simulates the effect of nicotine in your brain. This medicine is available by prescription. Ask your caregiver for advice about which medicines to use and how to use them based on your health history. Your caregiver will tell you what side effects to look out for if you choose to be on a medicine or therapy. Carefully read the information on the package. Do not use any other product containing nicotine while using a nicotine replacement product.  RELAPSE OR DIFFICULT SITUATIONS Most relapses occur within the first 3 months after quitting. Do not be  discouraged if you start smoking again. Remember, most people try several times before finally quitting. You may have symptoms of withdrawal because your body is used to nicotine. You may crave cigarettes, be irritable, feel very hungry, cough often, get headaches, or have difficulty concentrating. The withdrawal symptoms are only temporary. They are strongest when you first quit, but they will go away within 10 14 days. To reduce the chances of relapse, try to:  Avoid drinking alcohol. Drinking lowers your chances of successfully quitting.  Reduce the amount of caffeine you consume. Once you quit smoking, the amount of caffeine in your body increases and can give you symptoms, such as a rapid heartbeat, sweating, and anxiety.  Avoid smokers because they can make you want to smoke.  Do not let weight gain distract you. Many smokers will gain weight when they quit, usually less than 10 pounds. Eat a healthy diet and stay active. You can always lose the weight gained after you quit.  Find ways to improve your mood other than smoking. FOR MORE INFORMATION  www.smokefree.gov    While it can be one of the most difficult things to do, the [place] community has programs to help you stop.  Consider talking with your primary care physician about options.  Also, Smoking Cessation classes are available through the [facility]:  The smoking cessation program is a proven-effective program from the American Lung Association. The program is available for anyone 55 and older who currently smokes. The program lasts for 7 weeks and is 8 sessions. Each class will be approximately 1 1/2 hours. The program is every Tuesday.  All classes are 12-1:30pm and same location.  Event Location Information:  Location: [facility] 2nd Floor Conference Room 2-037; located next to [facility] cross streets: [place] Entrance into the [facility] is adjacent  to the [employer] main entrance. The conference room is located on the 2nd floor.  Parking Instructions: Visitor parking is adjacent to [employer] main entrance  and the [place]    A smoking cessation program is also offered through the [facility]. Register online at [email or web address].gl or call [phone] for more information.   Tobacco cessation counseling is available at [facility]. Call [phone] for a free appointment.   Tobacco cessation classes also are available through the [facility] in [place]. For information, call [phone].   The Patient Education Network features videos on tobacco cessation. Please consult your listings in the center of this book to find instructions on how to access this resource.   If you want more information, ask your nurse.   o

## 2013-05-22 ENCOUNTER — Telehealth (INDEPENDENT_AMBULATORY_CARE_PROVIDER_SITE_OTHER): Payer: Self-pay | Admitting: General Surgery

## 2013-05-22 DIAGNOSIS — K5732 Diverticulitis of large intestine without perforation or abscess without bleeding: Secondary | ICD-10-CM

## 2013-05-22 NOTE — Telephone Encounter (Signed)
Correction: This is a diverticulitis patient needs weekly 6 weeks after placement drain which means mid-May is the soonest they can be done. Otherwise she risks getting a colostomy. She wishes to avoid that. I ask that she follow my recommendations. He can wait a few weeks if that means coordinating the colonoscopy in the same time.  Reminder to quit smoking as well. I did about 30 times

## 2013-05-22 NOTE — Telephone Encounter (Signed)
Spoke with patient and informed her that we are still working on this process for her.  I did explain the information that Dr. Johney Maine went into detail on the message below.  She is okay with this for now.  She would like to know whether or not Dr. Johney Maine will give her permission to work, lift, exercise, and go back to eating a regular diet.  Informed her that Lars Mage, Dr. Clyda Greener assistant, will give her a call back early next week with more information in regards to her care.

## 2013-05-22 NOTE — Telephone Encounter (Signed)
Patient called in explaining that she was told by Dr. Johney Maine she could have her surgery in mid may.  She received our call from the surgery schedulers with a date now pushed into June.  The patient wanted to know if there was anyway to move her surgery date up. After reviewing her case, I explained that the reason it is pushed out into June is more than likely related to it being a combination case between two physicians.  I explained that there has to be OR time available for both surgeons at the same time in order to do this.  Informed her that I would send this message to Dr. Franchot Erichsen and our surgery schedulers just to have a second look at the schedule.  Informed her that if by chance we could work things out, we would give her a call.

## 2013-05-22 NOTE — Telephone Encounter (Signed)
If you can have your staff send me possible dates that would help me let you know my or partner availability

## 2013-05-22 NOTE — Telephone Encounter (Signed)
The window for surgery is 8-12 weeks after completion of chemotherapy.  It needs to be within that window.

## 2013-05-27 MED ORDER — METRONIDAZOLE 500 MG PO TABS: ORAL_TABLET | ORAL | Status: DC

## 2013-05-27 MED ORDER — NEOMYCIN SULFATE 500 MG PO TABS: ORAL_TABLET | ORAL | Status: DC

## 2013-05-27 NOTE — Addendum Note (Signed)
Addended by: Illene Regulus on: 05/27/2013 12:39 PM   Modules accepted: Orders

## 2013-05-27 NOTE — Telephone Encounter (Signed)
Called pt to speak to her about the surgical plans and colonoscopy with Dr Carlean Purl. I see the pt is scheduled for the robotic colectomy on 07/16/13 by Dr Johney Maine and colonoscopy scheduled by Dr Carlean Purl on 07/15/13. I did check with Dr Johney Maine to make sure this date was ok since his note was talking about mid May and Dr Johney Maine said this date is fine for surgery since it's still in the window of time. I did advise pt that she is to follow the colonoscopy prep for the colonoscopy and for the surgery. I advised pt that she is to take Flagyl and Neomycin the day before surgery at the scheduled times on the bottle but with her colonoscopy scheduled at a certain time she can adjust the times as needed per Dr Johney Maine. I did advise pt that she can have clear liquids when she comes home from the colonoscopy but nothing after midnight. I will mail the 2 abx's to her today. I advised pt that if she does not get the rx's to call me. The pt wants to know if she can take a shower with the drain in place. I did check with Dr Johney Maine on the shower he said yes it's ok. The pt is asking about Probiotics and Dr Johney Maine said for pt to check with the physician who ordered the Probiotics.

## 2013-05-27 NOTE — Telephone Encounter (Signed)
Pt wanted me to check on her diet if she can go back to a full diet. The pt wanted me to check on if she can have dental surgery that involves her gums before the colon sx. Per Dr Johney Maine the pt should really be on a low resideu bland diet and ok for the dental surgery. The pt understands.

## 2013-06-03 ENCOUNTER — Telehealth (INDEPENDENT_AMBULATORY_CARE_PROVIDER_SITE_OTHER): Payer: Self-pay | Admitting: General Surgery

## 2013-06-03 DIAGNOSIS — B370 Candidal stomatitis: Secondary | ICD-10-CM

## 2013-06-03 MED ORDER — FLUCONAZOLE 200 MG PO TABS: 100.0000 mg | ORAL_TABLET | Freq: Every day | ORAL | Status: DC

## 2013-06-03 NOTE — Telephone Encounter (Signed)
Okay to have more fluconazole 100 mg by mouth daily x3 days  She should have a refill on the old prescription.  Make sure the new prescription has 3 refills.

## 2013-06-03 NOTE — Addendum Note (Signed)
Addended by: Illene Regulus on: 06/03/2013 02:31 PM   Modules accepted: Orders

## 2013-06-03 NOTE — Telephone Encounter (Signed)
LMOM for pt notifying her that we did e prescribe a Diflucan 100mg  #3 take one daily w/ 3 RF's per Dr Johney Maine.

## 2013-06-03 NOTE — Telephone Encounter (Signed)
Pt states that she has been taking her abx and that she has noticed she is starting to have thrush again. Pt states that when she was taking the abx and was prescribed magic mouthwash it did not work for her and Dr Johney Maine had to call in her Diflucan which helped. She would like to have another refill on her Diflucan. Please advise.

## 2013-06-08 ENCOUNTER — Other Ambulatory Visit: Payer: Self-pay | Admitting: Internal Medicine

## 2013-06-17 ENCOUNTER — Encounter (INDEPENDENT_AMBULATORY_CARE_PROVIDER_SITE_OTHER): Payer: Self-pay | Admitting: Surgery

## 2013-06-17 ENCOUNTER — Ambulatory Visit (INDEPENDENT_AMBULATORY_CARE_PROVIDER_SITE_OTHER): Payer: Medicaid Other | Admitting: Surgery

## 2013-06-17 VITALS — BP 110/70 | HR 74 | Resp 18 | Ht 65.0 in | Wt 136.2 lb

## 2013-06-17 DIAGNOSIS — Z720 Tobacco use: Secondary | ICD-10-CM

## 2013-06-17 DIAGNOSIS — K5720 Diverticulitis of large intestine with perforation and abscess without bleeding: Secondary | ICD-10-CM

## 2013-06-17 DIAGNOSIS — K632 Fistula of intestine: Secondary | ICD-10-CM

## 2013-06-17 DIAGNOSIS — M6200 Separation of muscle (nontraumatic), unspecified site: Secondary | ICD-10-CM

## 2013-06-17 DIAGNOSIS — K5732 Diverticulitis of large intestine without perforation or abscess without bleeding: Secondary | ICD-10-CM

## 2013-06-17 DIAGNOSIS — M6208 Separation of muscle (nontraumatic), other site: Secondary | ICD-10-CM

## 2013-06-17 DIAGNOSIS — F17200 Nicotine dependence, unspecified, uncomplicated: Secondary | ICD-10-CM

## 2013-06-17 DIAGNOSIS — K630 Abscess of intestine: Secondary | ICD-10-CM

## 2013-06-17 NOTE — Progress Notes (Signed)
Subjective:     Patient ID: Tracey Fields, female   DOB: 01/07/1961, 52 y.o.   MRN: [id number]  HPI   Note: This dictation was prepared with Dragon/digital dictation along with Smartphrase technology. Any transcriptional errors that result from this process are unintentional.       Tracey Fields  10/26/1960 [id number]  Patient Care Team: James W John, MD as PCP - General  This patient is a 52 y.o.female who presents today for surgical evaluation at the request of Dr. Wakefield.   Reason for visit: Follow up on diverticulitis with abscess status post hospital admission and drain  Female with recurrent episodes of diverticulitis treated with antibiotics in the past.  Developed more severe attack with perforation and abscess.  Was admitted.  Drain placed.  Transitioned to outpatient therapy and discharged home.  Discharge 2 weeks ago.  She comes here for followup.  She was due to follow up with Dr. Wakefield but due to family emergency he is not available so I am seeing the patient.    She continues to smoke but trying to quit.  Her pain is gone down.  She has been having a bland diet.  Some mild loose stools but not severe.  She comes today with her niece.  An exit site of the drain is somewhat underlying with a little bit of drainage.  Her pricker concern is a deep left groin pain when she sits.  Some intermittent episodes of nausea.  She will force herself to vomit.  Otherwise she is tolerating solid foods.  Bowels a little irregular but that is her baseline.  Continues on oral antibiotics  Patient Active Problem List   Diagnosis Date Noted  . Tobacco abuse 05/21/2013  . 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 Oral candida 05/01/2013  . Diverticulitis of large intestine with perforation and abscess 04/25/2013  . Alcohol intake above recommended sensible limits with complication 04/08/2013  . Chronic pancreatitis 07/29/2012  . COPD (chronic obstructive pulmonary disease)  04/01/2012  . 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 Insomnia 04/01/2012  . Preventative health care 10/19/2010  . Impaired glucose tolerance 10/19/2010  . BACK PAIN 01/12/2009  . HOARSENESS, CHRONIC 08/13/2008  . FATIGUE 07/14/2008  . DYSPNEA 06/02/2008  . DEPRESSION 12/29/2007  . HYPERTENSION 12/29/2007  . DIVERTICULOSIS, COLON 12/29/2007  . ANXIETY 09/21/2006  . 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 Irritable bowel syndrome 09/21/2006  . COUGH, CHRONIC 09/21/2006  . HEMORRHOIDS, HX OF 09/21/2006    Past Medical History  Diagnosis Date  . Abdominal pain, left lower quadrant 02/24/2010  . ANXIETY 09/21/2006  . BACK PAIN 01/12/2009  . COUGH, CHRONIC 09/21/2006  . DEPRESSION 12/29/2007  . DIVERTICULOSIS, COLON 12/29/2007  . DYSPNEA 06/02/2008  . FATIGUE 07/14/2008  . HEMORRHOIDS, HX OF 09/21/2006  . HOARSENESS, CHRONIC 08/13/2008  . HYPERTENSION 12/29/2007  . Irritable bowel syndrome 09/21/2006  . 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 SORE THROAT 07/14/2008  . Wheezing 05/18/2009  . Impaired glucose tolerance 10/19/2010  . Chronic pancreatitis 07/29/2012    Past Surgical History  Procedure Laterality Date  . Fractured jaw  1980    required wiring  . Colonoscopy    . Upper gastrointestinal endoscopy      History   Social History  . Marital Status: Divorced    Spouse Name: N/A    Number of Children: 2  . Years of Education: N/A   Occupational History  . independent contractor    Social   History Main Topics  . Smoking status: Current Every Day Smoker -- 1.00 packs/day    Types: Cigarettes  . Smokeless tobacco: Never Used     Comment: 1 ppd, patient has been counseled to quit smoking 08/13/08  . Alcohol Use: Yes     Comment: social  . Drug Use: No  . Sexual Activity: Yes   Other Topics Concern  . Not on file   Social History Narrative  . No narrative on file    Family History  Problem Relation Age of Onset  . Lung cancer Father   . Diabetes  Maternal Grandfather   . Colon polyps Other   . Hypertension Mother   . Prostate cancer Father     Current Outpatient Prescriptions  Medication Sig Dispense Refill  . acetaminophen (TYLENOL) 500 MG tablet Take 1,000 mg by mouth 2 (two) times daily as needed (pain).      . ALPRAZolam (XANAX) 0.5 MG tablet Take 0.25 mg by mouth 3 (three) times daily as needed for anxiety.       . amoxicillin-clavulanate (AUGMENTIN) 875-125 MG per tablet Take 1 tablet by mouth 2 (two) times daily.  28 tablet  2  . b complex vitamins capsule Take 1 capsule by mouth at bedtime.       . cetirizine (ZYRTEC) 10 MG tablet Take 5 mg by mouth at bedtime.       . desvenlafaxine (PRISTIQ) 100 MG 24 hr tablet Take 200 mg by mouth daily.      . [START ON 07/15/2013] metroNIDAZOLE (FLAGYL) 500 MG tablet Take 2 tablets (1,000mg total) by mouth as directed. Take 2 pills (1,000mg) by mouth at 1pm, 3pm, and 10pm the day before your colorectal operation as discussed in [facility]. Call [phone] with questions.  6 tablet  0  . naproxen sodium (ALEVE) 220 MG tablet Take 440 mg by mouth daily as needed (pain).      . [START ON 07/15/2013] neomycin (MYCIFRADIN) 500 MG tablet Take 2 tablets (1,000mg total) by mouth as directed. Take 2 pills (1,000mg) by mouth at 1pm, 3pm, and 10pm the day before your colorectal operation as discussed in [facility]. Call [phone] with questions.  6 tablet  0  . Pancrelipase, Lip-Prot-Amyl, (CREON) 36000 UNITS CPEP Take 36,000 Units by mouth 3 (three) times daily.      . propranolol (INDERAL) 10 MG tablet Take 10 mg by mouth daily.      . propranolol (INDERAL) 10 MG tablet TAKE 1/2 TABLET BY MOUTH DAILY.  45 tablet  0  . sodium chloride 0.9 % injection Inject 10 mLs into the vein as needed.  100 mL  5  . zolpidem (AMBIEN) 10 MG tablet Take 5 mg by mouth at bedtime as needed for sleep.      . aspirin EC 81 MG tablet Take 81 mg by mouth at bedtime.       . fluconazole (DIFLUCAN) 200 MG tablet Take 0.5 tablets (100 mg  total) by mouth daily.  3 tablet  3   No current facility-administered medications for this visit.     Allergies  Allergen Reactions  . Anesthetics, Amide Other (See Comments)    System shut down when 53 years old having jaw work done  . Succinylcholine Chloride Other (See Comments)    Major organs stopped working    BP 110/70  Pulse 74  Resp 18  Ht 5' 5" (1.651 m)  Wt 136 lb 3.2 oz (61.78 kg)  BMI 22.66 kg/m2    Ct Abdomen Pelvis W Contrast  05/20/2013   CLINICAL DATA:  History of left lower quadrant diverticular abscess with persistent drainage from the catheter. Intermittent left lower quadrant pain.  EXAM: CT ABDOMEN AND PELVIS WITH CONTRAST  TECHNIQUE: Multidetector CT imaging of the abdomen and pelvis was performed using the standard protocol following bolus administration of intravenous contrast.  CONTRAST:  80mL OMNIPAQUE IOHEXOL 300 MG/ML  SOLN  COMPARISON:  CT IMAGE GUIDED DRAINAGE BY PERCUTANEOUS CATHETER dated 04/26/2013; CT ABD/PELVIS W CM dated 04/25/2013  FINDINGS: Lung bases show scattered scarring or atelectasis. Heart size normal. No pericardial or pleural effusion.  Liver, gallbladder, adrenal glands, kidneys and spleen are unremarkable. Calcifications are seen in the pancreatic head and uncinate process. Pancreas, stomach, small bowel and proximal colon are otherwise unremarkable. Minimal residual soft tissue thickening adjacent to the descending colon, at the site of indwelling percutaneous drain (series 2, image 42), without measurable fluid collection. Slight thickening of the posterior wall of the descending duodenum in association. Remainder of the colon is unremarkable.  Uterus and ovaries are visualized. No pathologically enlarged lymph nodes. No free fluid. Atherosclerotic calcification of the arterial vasculature without abdominal aortic aneurysm. No worrisome lytic or sclerotic lesions. Degenerative changes are seen in the spine with minimal retrolisthesis of L5 on S1.   IMPRESSION: 1. Minimal residual soft tissue thickening and stranding in the left lower quadrant, adjacent to the descending duodenum, at the site of indwelling percutaneous drain. No measurable fluid collection. 2. Chronic calcific pancreatitis.   Electronically Signed   By: Melinda  Blietz M.D.   On: 05/20/2013 13:16   Ct Abdomen Pelvis W Contrast  04/25/2013   CLINICAL DATA:  Left lower quadrant abdominal pain 5 days  EXAM: CT ABDOMEN AND PELVIS WITH CONTRAST  TECHNIQUE: Multidetector CT imaging of the abdomen and pelvis was performed using the standard protocol following bolus administration of intravenous contrast.  CONTRAST:  80mL OMNIPAQUE IOHEXOL 300 MG/ML  SOLN  COMPARISON:  CT ABD/PELVIS W CM dated 07/30/2012  FINDINGS: Lung bases are clear.  No pericardial fluid.  No focal hepatic lesion. The gallbladder, pancreas, spleen, adrenal glands, kidneys demonstrate no acute findings. There is calcifications of the pancreatic head similar prior consistent with chronic pancreatitis.  The stomach, small bowel, and cecum normal. There is a fluid collection along the posterior wall of the descending colon measuring 3.7 x 3.5 cm. This has a thin enhancing rim and extends inferiorly along the pericolic gutter. This finding is most consistent with ruptured diverticulitis with abscess formation. The abscess extends to involve the left psoas muscle and iliacus muscle to a minimal degree (image 50 series 2). No evidence of bowel obstruction. No evidence of frank leak of the oral contrast. Contrast flows entirety of the colon to the rectum.  Abdominal or is normal caliber. No retroperitoneal periportal lymphadenopathy.  No free fluid the pelvis. Uterus and ovaries are normal. No pelvic lymphadenopathy. No aggressive osseous lesion.  IMPRESSION: 1. Abscess along the descending colon is most consistent with ruptured diverticulitis with abscess formation. 2. Abscess extends along the left pericolic gutter to involve the left  psoas and iliacus muscle. 3. No evidence of active leak of oral contrast. 4. No significant intraperitoneal free air. Findings conveyed toJenniffer Pipenbrink, PAon 04/25/2013  at21:55.   Electronically Signed   By: Stewart  Edmunds M.D.   On: 04/25/2013 21:56   Ir Sinus/fist Tube Chk-non Gi  05/13/2013   INDICATION: History of left-sided perforated diverticular abscess, post CT-guided percutaneous drainage catheter   placement (04/26/2013).  EXAM: SINUS TRACT INJECTION/FISTULOGRAM  COMPARISON:  CT IMAGE GUIDED DRAINAGE BY PERCUTANEOUS CATHETER dated 04/26/2013; CT ABD/PELVIS W CM dated 04/25/2013  MEDICATIONS: None.  CONTRAST:  10mL OMNIPAQUE IOHEXOL 300 MG/ML  SOLN  FLUOROSCOPY TIME:  48 seconds.  COMPLICATIONS: None immediate  TECHNIQUE: A preprocedural spot fluoroscopic image was obtained of the existing left lower quadrant abscess drainage catheter.  Multiple spot fluoroscopic and radiographic images were obtained in various obliquities following the injection of a small amount of contrast via the existing percutaneous drainage catheter.  Images were reviewed and the procedure was terminated. The catheter was flushed with a small amount of saline and re-connected to a gravity bag. A dressing was placed. The patient tolerated the procedure well without immediate postprocedural complication.  FINDINGS: Preprocedural spot fluoroscopic image demonstrates grossly unchanged positioning of the abscess drainage catheter within the left lower abdominal quadrant.  Contrast injection demonstrates brisk opacification of the descending colon via potentially 2 discrete fistulous tracts with the adjacent distal descending colon.  IMPRESSION: Fluoroscopic guided drain injection demonstrates persistent communication with the end of the drainage catheter and the adjacent descending colon, potentially via 2 discrete fistulous tracts. As such, the drainage catheter was not removed.  Critical Value/emergent results were called by  telephone at the time of interpretation on 05/13/2013 at 1:34 PM to Dr. Wakefield, who verbally acknowledged these results.   Electronically Signed   By: John  Watts M.D.   On: 05/13/2013 13:36   Ct Image Guided Drainage By Percutaneous Catheter  04/26/2013   CLINICAL DATA:  Diverticular abscess in the left lower quadrant  EXAM: CT IMAGE GUIDED DRAINAGE BY PERCUTANEOUS CATHETER  FLUOROSCOPY TIME:  None  MEDICATIONS AND MEDICAL HISTORY: Versed four mg, Fentanyl 100 mcg.  Additional Medications: None.  ANESTHESIA/SEDATION: Moderate sedation time: 40 minutes  CONTRAST:  None.  PROCEDURE: The procedure, risks, benefits, and alternatives were explained to the patient. Questions regarding the procedure were encouraged and answered. The patient understands and consents to the procedure.  The left lower quadrant was prepped with Betadine in a sterile fashion, and a sterile drape was applied covering the operative field. A sterile gown and sterile gloves were used for the procedure.  1% lidocaine was utilized for local anesthesia. Under CT guidance, an 18 gauge needle was inserted into the left lower quadrant abscess posterior to the descending colon. It was removed over an Amplatz wire. A 12 French dilator followed by a 12 French drain were inserted. It was looped and string fixed and sewn to the skin. Frank pus was aspirated  FINDINGS: Images demonstrate 12 French drain placement into a diverticular abscess. 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  COMPLICATIONS: None  IMPRESSION: Successful left lower quadrant abscess drain.   Electronically Signed   By: Art  Hoss M.D.   On: 04/26/2013 10:28     Review of Systems  Constitutional: Negative for fever, chills and diaphoresis.  HENT: Negative for ear pain, sore throat and trouble swallowing.   Eyes: Negative for photophobia and visual disturbance.  Respiratory: Negative for cough and choking.   Cardiovascular: Negative for  chest pain and palpitations.  Gastrointestinal: Positive for nausea and abdominal pain. Negative for vomiting, diarrhea, constipation, blood in stool, anal bleeding and rectal pain.  Endocrine: Negative for cold intolerance and heat intolerance.  Genitourinary: Negative for dysuria, frequency and difficulty urinating.  Musculoskeletal: Negative for gait problem and myalgias.  Skin: Negative for color change, pallor and rash.    Allergic/Immunologic: Negative for environmental allergies and food allergies.  Neurological: Negative for dizziness, speech difficulty, weakness and numbness.  Hematological: Negative for adenopathy.  Psychiatric/Behavioral: Negative for confusion and agitation. The patient is not nervous/anxious.        Objective:   Physical Exam  Constitutional: She is oriented to person, place, and time. She appears well-developed and well-nourished. No distress.  HENT:  Head: Normocephalic.  Mouth/Throat: Oropharynx is clear and moist. No oropharyngeal exudate.  Eyes: Conjunctivae and EOM are normal. Pupils are equal, round, and reactive to light. No scleral icterus.  Neck: Normal range of motion. Neck supple. No tracheal deviation present.  Cardiovascular: Normal rate, regular rhythm and intact distal pulses.   Pulmonary/Chest: Effort normal and breath sounds normal. No stridor. No respiratory distress. She exhibits no tenderness.  Abdominal: Soft. She exhibits no distension and no mass. There is no tenderness. No hernia. Hernia confirmed negative in the ventral area, confirmed negative in the right inguinal area and confirmed negative in the left inguinal area.    Genitourinary: No vaginal discharge found.  Musculoskeletal: Normal range of motion. She exhibits no tenderness.       Right elbow: She exhibits normal range of motion.       Left elbow: She exhibits normal range of motion.       Right wrist: She exhibits normal range of motion.       Left wrist: She exhibits  normal range of motion.       Right hand: Normal strength noted.       Left hand: Normal strength noted.  Lymphadenopathy:       Head (right side): No posterior auricular adenopathy present.       Head (left side): No posterior auricular adenopathy present.    She has no cervical adenopathy.    She has no axillary adenopathy.       Right: No inguinal adenopathy present.       Left: No inguinal adenopathy present.  Neurological: She is alert and oriented to person, place, and time. No cranial nerve deficit. She exhibits normal muscle tone. Coordination normal.  Skin: Skin is warm and dry. No rash noted. She is not diaphoretic. No erythema.  Psychiatric: She has a normal mood and affect. Her behavior is normal. Judgment and thought content normal.       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  If no more fistula and abscess, remove drain.  If the fistula persists, keep drain.  If still struggling, consider repeat CT scan of abdomen and pelvis to rule out no abscess.  Hopefully unlikely.  Patient would benefit from colonoscopy done just before surgery to rule out malignancy.  Dr. Gessner planning on the day before surgery.    Plan minimally invasive approach with robotics vs. Laparoscopic approach and open backup.  We will plan splenic flexure mobilization since this seems to be at the descending/sigmoid junction.:  The anatomy & physiology of the digestive tract was discussed.  The pathophysiology of the colon was discussed.  Natural history risks without surgery was discussed.   I feel the risks of no intervention will lead to serious problems that outweigh the operative risks; therefore, I recommended a partial colectomy to remove the pathology.  Minimally invasive (Robotic/Laparoscopic) & open techniques were discussed.   Risks such as bleeding, infection, abscess, leak, reoperation,  possible   ostomy, hernia, heart attack, death, and other risks were discussed.  I noted a good likelihood this will help address the problem.   Goals of post-operative recovery were discussed as well.   Need for bowel regimen and healthy physical activity to optimize recovery noted as well. We will work to minimize complications.  Educational materials were given as well.  Questions were answered.  The patient expresses understanding & wishes to proceed with surgery.         

## 2013-06-17 NOTE — Patient Instructions (Signed)
Please consider the recommendations that we have given you today:  We will arrange an x-ray study of your drain.  If there is no fistulous connection to your colon and no abscess, the drain can be removed.  If you have persistent or worsening problems in your groin after the drain is removed, we will repeat a CAT scan of the abdomen.  Continue plan for antibiotics and surgery  See the Handout(s) we have given you.  Please call our office at [phone] if you wish to schedule surgery or if you have further questions / concerns.   Diverticulitis A diverticulum is a small pouch or sac on the colon. Diverticulosis is the presence of these diverticula on the colon. Diverticulitis is the irritation (inflammation) or infection of diverticula. CAUSES  The colon and its diverticula contain bacteria. If food particles block the tiny opening to a diverticulum, the bacteria inside can grow and cause an increase in pressure. This leads to infection and inflammation and is called diverticulitis. SYMPTOMS   Abdominal pain and tenderness. Usually, the pain is located on the left side of your abdomen. However, it could be located elsewhere.  Fever.  Bloating.  Feeling sick to your stomach (nausea).  Throwing up (vomiting).  Abnormal stools. DIAGNOSIS  Your caregiver will take a history and perform a physical exam. Since many things can cause abdominal pain, other tests may be necessary. Tests may include:  Blood tests.  Urine tests.  X-ray of the abdomen.  CT scan of the abdomen. Sometimes, surgery is needed to determine if diverticulitis or other conditions are causing your symptoms. TREATMENT  Most of the time, you can be treated without surgery. Treatment includes:  Resting the bowels by only having liquids for a few days. As you improve, you will need to eat a low-fiber diet.  Intravenous (IV) fluids if you are losing body fluids (dehydrated).  Antibiotic medicines that treat  infections may be given.  Pain and nausea medicine, if needed.  Surgery if the inflamed diverticulum has burst. HOME CARE INSTRUCTIONS   Try a clear liquid diet (broth, tea, or water for as long as directed by your caregiver). You may then gradually begin a low-fiber diet as tolerated.  A low-fiber diet is a diet with less than 10 grams of fiber. Choose the foods below to reduce fiber in the diet:  White breads, cereals, rice, and pasta.  Cooked fruits and vegetables or soft fresh fruits and vegetables without the skin.  Ground or well-cooked tender beef, ham, veal, lamb, pork, or poultry.  Eggs and seafood.  After your diverticulitis symptoms have improved, your caregiver may put you on a high-fiber diet. A high-fiber diet includes 14 grams of fiber for every 1000 calories consumed. For a standard 2000 calorie diet, you would need 28 grams of fiber. Follow these diet guidelines to help you increase the fiber in your diet. It is important to slowly increase the amount fiber in your diet to avoid gas, constipation, and bloating.  Choose whole-grain breads, cereals, pasta, and brown rice.  Choose fresh fruits and vegetables with the skin on. Do not overcook vegetables because the more vegetables are cooked, the more fiber is lost.  Choose more nuts, seeds, legumes, dried peas, beans, and lentils.  Look for food products that have greater than 3 grams of fiber per serving on the Nutrition Facts label.  Take all medicine as directed by your caregiver.  If your caregiver has given you a follow-up appointment, it  is very important that you go. Not going could result in lasting (chronic) or permanent injury, pain, and disability. If there is any problem keeping the appointment, call to reschedule. SEEK MEDICAL CARE IF:   Your pain does not improve.  You have a hard time advancing your diet beyond clear liquids.  Your bowel movements do not return to normal. SEEK IMMEDIATE MEDICAL CARE  IF:   Your pain becomes worse.  You have an oral temperature above 102 F (38.9 C), not controlled by medicine.  You have repeated vomiting.  You have bloody or black, tarry stools.  Symptoms that brought you to your caregiver become worse or are not getting better. MAKE SURE YOU:   Understand these instructions.  Will watch your condition.  Will get help right away if you are not doing well or get worse. Document Released: 11/01/2004 Document Revised: 04/16/2011 Document Reviewed: 02/27/2010 [facility] Patient Information 2014 [place] OverlookExitCare, [place].  [place] S[place]!  We strongly recommend that you stop smoking.  Smoking increases the risk of surgery including infection in the form of an open wound, pus formation, abscess, hernia at an incision on the abdomen, etc.  You have an increased risk of other MAJOR complications such as stroke, heart attack, forming clots in the leg and/or lungs, and death.    Smoking Cessation Quitting smoking is important to your health and has many advantages. However, it is not always easy to quit since nicotine is a very addictive drug. Often times, people try 3 times or more before being able to quit. This document explains the best ways for you to prepare to quit smoking. Quitting takes hard work and a lot of effort, but you can do it. ADVANTAGES OF QUITTING SMOKING  You will live longer, feel better, and live better.  Your body will feel the impact of quitting smoking almost immediately.  Within 20 minutes, blood pressure decreases. Your pulse returns to its normal level.  After 8 hours, carbon monoxide levels in the blood return to normal. Your oxygen level increases.  After 24 hours, the chance of having a heart attack starts to decrease. Your breath, hair, and body stop smelling like smoke.  After 48 hours, damaged nerve endings begin to recover. Your sense of taste and smell improve.  After 72 hours, the body is virtually free of nicotine. Your  bronchial tubes relax and breathing becomes easier.  After 2 to 12 weeks, lungs can hold more air. Exercise becomes easier and circulation improves.  The risk of having a heart attack, stroke, cancer, or lung disease is greatly reduced.  After 1 year, the risk of coronary heart disease is cut in half.  After 5 years, the risk of stroke falls to the same as a nonsmoker.  After 10 years, the risk of lung cancer is cut in half and the risk of other cancers decreases significantly.  After 15 years, the risk of coronary heart disease drops, usually to the level of a nonsmoker.  If you are pregnant, quitting smoking will improve your chances of having a healthy baby.  The people you live with, especially any children, will be healthier.  You will have extra money to spend on things other than cigarettes. QUESTIONS TO THINK ABOUT BEFORE ATTEMPTING TO QUIT You may want to talk about your answers with your caregiver.  Why do you want to quit?  If you tried to quit in the past, what helped and what did not?  What will be the most difficult  situations for you after you quit? How will you plan to handle them?  Who can help you through the tough times? Your family? Friends? A caregiver?  What pleasures do you get from smoking? What ways can you still get pleasure if you quit? Here are some questions to ask your caregiver:  How can you help me to be successful at quitting?  What medicine do you think would be best for me and how should I take it?  What should I do if I need more help?  What is smoking withdrawal like? How can I get information on withdrawal? GET READY  Set a quit date.  Change your environment by getting rid of all cigarettes, ashtrays, matches, and lighters in your home, car, or work. Do not let people smoke in your home.  Review your past attempts to quit. Think about what worked and what did not. GET SUPPORT AND ENCOURAGEMENT You have a better chance of being  successful if you have help. You can get support in many ways.  Tell your family, friends, and co-workers that you are going to quit and need their support. Ask them not to smoke around you.  Get individual, group, or telephone counseling and support. Programs are available at [employer] and health centers. Call your local health department for information about programs in your area.  Spiritual beliefs and practices may help some smokers quit.  Download a "quit meter" on your computer to keep track of quit statistics, such as how long you have gone without smoking, cigarettes not smoked, and money saved.  Get a self-help book about quitting smoking and staying off of tobacco. Webster yourself from urges to smoke. Talk to someone, go for a walk, or occupy your time with a task.  Change your normal routine. Take a different route to work. Drink tea instead of coffee. Eat breakfast in a different place.  Reduce your stress. Take a hot bath, exercise, or read a book.  Plan something enjoyable to do every day. Reward yourself for not smoking.  Explore interactive web-based programs that specialize in helping you quit. GET MEDICINE AND USE IT CORRECTLY Medicines can help you stop smoking and decrease the urge to smoke. Combining medicine with the above behavioral methods and support can greatly increase your chances of successfully quitting smoking.  Nicotine replacement therapy helps deliver nicotine to your body without the negative effects and risks of smoking. Nicotine replacement therapy includes nicotine gum, lozenges, inhalers, nasal sprays, and skin patches. Some may be available over-the-counter and others require a prescription.  Antidepressant medicine helps people abstain from smoking, but how this works is unknown. This medicine is available by prescription.  Nicotinic receptor partial agonist medicine simulates the effect of nicotine in your  brain. This medicine is available by prescription. Ask your caregiver for advice about which medicines to use and how to use them based on your health history. Your caregiver will tell you what side effects to look out for if you choose to be on a medicine or therapy. Carefully read the information on the package. Do not use any other product containing nicotine while using a nicotine replacement product.  RELAPSE OR DIFFICULT SITUATIONS Most relapses occur within the first 3 months after quitting. Do not be discouraged if you start smoking again. Remember, most people try several times before finally quitting. You may have symptoms of withdrawal because your body is used to nicotine. You may crave cigarettes,  be irritable, feel very hungry, cough often, get headaches, or have difficulty concentrating. The withdrawal symptoms are only temporary. They are strongest when you first quit, but they will go away within 10 14 days. To reduce the chances of relapse, try to:  Avoid drinking alcohol. Drinking lowers your chances of successfully quitting.  Reduce the amount of caffeine you consume. Once you quit smoking, the amount of caffeine in your body increases and can give you symptoms, such as a rapid heartbeat, sweating, and anxiety.  Avoid smokers because they can make you want to smoke.  Do not let weight gain distract you. Many smokers will gain weight when they quit, usually less than 10 pounds. Eat a healthy diet and stay active. You can always lose the weight gained after you quit.  Find ways to improve your mood other than smoking. FOR MORE INFORMATION  www.smokefree.gov    While it can be one of the most difficult things to do, the [place] community has programs to help you stop.  Consider talking with your primary care physician about options.  Also, Smoking Cessation classes are available through the [facility]:  The smoking cessation program is a proven-effective program from the American  [employer]. The program is available for anyone 69 and older who currently smokes. The program lasts for 7 weeks and is 8 sessions. Each class will be approximately 1 1/2 hours. The program is every Tuesday.  All classes are 12-1:30pm and same location.  Event Location Information:  Location: [place] 2nd Floor Conference Room 2-037; located next to [facility] cross streets: [place] Entrance into the [facility] is adjacent to the [employer] main entrance. The conference room is located on the 2nd floor.  Parking Instructions: Visitor parking is adjacent to [employer] main entrance and the [place]    A smoking cessation program is also offered through the [facility]. Register online at ClickDebate.gl or call [phone] for more information.   Tobacco cessation counseling is available at [facility]. Call [phone] for a free appointment.   Tobacco cessation classes also are available through the [place] [facility] in [place]. For information, call [phone].   The Patient Education Network features videos on tobacco cessation. Please consult your listings in the center of this book to find instructions on how to access this resource.   If you want more information, ask your nurse.   Diverticulosis Diverticulosis is a common condition that develops when small pouches (diverticula) form in the wall of the colon. The risk of diverticulosis increases with age. It happens more often in people who eat a low-fiber diet. Most individuals with diverticulosis have no symptoms. Those individuals with symptoms usually experience abdominal pain, constipation, or loose stools (diarrhea). HOME CARE INSTRUCTIONS   Increase the amount of fiber in your diet as directed by your caregiver or dietician. This may reduce symptoms of  diverticulosis.  Your caregiver may recommend taking a dietary fiber supplement.  Drink at least 6 to 8 glasses of water each day to prevent constipation.  Try not to strain when you have a bowel movement.  Your caregiver may recommend avoiding nuts and seeds to prevent complications, although this is still an uncertain benefit.  Only take over-the-counter or prescription medicines for pain, discomfort, or fever as directed by your caregiver. FOODS WITH HIGH FIBER CONTENT INCLUDE:  Fruits. Apple, peach,  pear, tangerine, raisins, prunes.  Vegetables. Brussels sprouts, asparagus, broccoli, cabbage, carrot, cauliflower, romaine lettuce, spinach, summer squash, tomato, winter squash, zucchini.  Starchy Vegetables. Baked beans, kidney beans, lima beans, split peas, lentils, potatoes (with skin).  Grains. Whole wheat bread, brown rice, bran flake cereal, plain oatmeal, white rice, shredded wheat, bran muffins. SEEK IMMEDIATE MEDICAL CARE IF:   You develop increasing pain or severe bloating.  You have an oral temperature above 102 F (38.9 C), not controlled by medicine.  You develop vomiting or bowel movements that are bloody or black. Document Released: 10/20/2003 Document Revised: 04/16/2011 Document Reviewed: 06/22/2009 [facility]Exit[facility] Patient Information 2014 [place]Care, [place]LL[place].

## 2013-06-18 ENCOUNTER — Encounter: Payer: Medicaid Other | Admitting: Internal Medicine

## 2013-06-19 ENCOUNTER — Telehealth: Payer: Self-pay | Admitting: Internal Medicine

## 2013-06-19 ENCOUNTER — Telehealth (INDEPENDENT_AMBULATORY_CARE_PROVIDER_SITE_OTHER): Payer: Self-pay | Admitting: *Deleted

## 2013-06-19 DIAGNOSIS — K5732 Diverticulitis of large intestine without perforation or abscess without bleeding: Secondary | ICD-10-CM

## 2013-06-19 MED ORDER — OXYCODONE HCL 5 MG PO TABS: 5.0000 mg | ORAL_TABLET | ORAL | Status: DC | PRN

## 2013-06-19 NOTE — Telephone Encounter (Signed)
Called pt to see what is going on with her with the message left below. The pt said the drain is just causing her so much discomfort that she is requesting some pain medicine to help with the pain until she has the drain study on Monday. I advised pt I would call Dr Johney Maine to ask for the script.   I paged Dr Johney Maine and he advised Oxycodone 5mg  #40 so I will get Dr Barry Dienes to sign script for Dr Johney Maine since he is in surgery.

## 2013-06-19 NOTE — Telephone Encounter (Signed)
Called to inform pt of her appointment for IR Sinus/Fist Tube Chk 06-22-13 @ 10:30.  Pt asked me to let Dr. Johney Maine know that she was in severe pain, stated that she couldn't sit on the toilet, couldn't bend over, and stated that she just don't think she could go through the weekend like this.  Pt asked if she could get something for pain called into her pharmacy?  I advised that I would send Dr. Johney Maine and Elmo Putt a message.  Pt understands and is in agree ance.  Anderson [place]

## 2013-06-19 NOTE — Telephone Encounter (Signed)
Called pt to notify her that a written script for Oxycodone 5mg  #40 has been signed by Dr Barry Dienes for Dr Johney Maine. The script will be at the front desk for p/u.

## 2013-06-19 NOTE — Telephone Encounter (Signed)
Called the patient informed of MD instructions.  The patient is needing a routine eye examine as has been approximately 10 yrs since the last.  Her eyes water all the time.  Stated the office where she wants an appointment requires a referral.  She stated she will call back with the name of office and MD to refer to as did not have with her at the time.

## 2013-06-19 NOTE — Addendum Note (Signed)
Addended by: Illene Regulus on: 06/19/2013 12:01 PM   Modules accepted: Orders

## 2013-06-19 NOTE — Telephone Encounter (Signed)
Please consider OV for this symptoms and to confirm her insurance, as last seen here June 2014.  Pt should go to UC or ER if she feels she is having acute eye symtpoms such as pain or new blurred vision

## 2013-06-19 NOTE — Telephone Encounter (Signed)
Patient is calling to request a referral to see an eye doctor. Advised pt that she may need an appointment. She recently switched over to [facility] (not [facility]). Please advise.

## 2013-06-22 ENCOUNTER — Ambulatory Visit (HOSPITAL_COMMUNITY)
Admission: RE | Admit: 2013-06-22 | Discharge: 2013-06-22 | Disposition: A | Discharge status: Home / Self Care | Payer: Medicaid Other | Source: Ambulatory Visit | Attending: Surgery | Admitting: Surgery

## 2013-06-22 DIAGNOSIS — K5720 Diverticulitis of large intestine with perforation and abscess without bleeding: Secondary | ICD-10-CM

## 2013-06-22 DIAGNOSIS — K630 Abscess of intestine: Secondary | ICD-10-CM | POA: Insufficient documentation

## 2013-06-22 DIAGNOSIS — K632 Fistula of intestine: Secondary | ICD-10-CM

## 2013-06-22 MED ORDER — IOHEXOL 300 MG/ML  SOLN
50.0000 mL | Freq: Once | INTRAMUSCULAR | Status: AC | PRN
  Administered 2013-06-22: 10 mL

## 2013-07-01 NOTE — Patient Instructions (Addendum)
Tracey Fields  07/01/2013   Your procedure is scheduled on:  07/16/2013  0730am-12noon  Report to [facility].  Follow the Signs to [place] at        0530    am  Call this number if you have problems the morning of surgery: [phone]   Remember:   Do not eat food or drink liquids after midnight.   Take these medicines the morning of surgery with A SIP OF WATER:    Do not wear jewelry, make-up or nail polish.  Do not wear lotions, powders, or perfumes.   Do not shave 48 hours prior to surgery.   Do not bring valuables to the hospital.  Contacts, dentures or bridgework may not be worn into surgery.  Leave suitcase in the car. After surgery it may be brought to your room.  For patients admitted to the hospital, checkout time is 11:00 AM the day of  discharge.      Please read over the following fact sheets that you were given: [facility] - Preparing for Surgery Before surgery, you can play an important role.  Because skin is not sterile, your skin needs to be as free of germs as possible.  You can reduce the number of germs on your skin by washing with CHG (chlorahexidine gluconate) soap before surgery.  CHG is an antiseptic cleaner which kills germs and bonds with the skin to continue killing germs even after washing. Please DO NOT use if you have an allergy to CHG or antibacterial soaps.  If your skin becomes reddened/irritated stop using the CHG and inform your nurse when you arrive at Short Stay. Do not shave (including legs and underarms) for at least 48 hours prior to the first CHG shower.  You may shave your face/neck. Please follow these instructions carefully:  1.  Shower with CHG Soap the night before surgery and the  morning of Surgery.  2.  If you choose to wash your hair, wash your hair first as usual with your  normal  shampoo.  3.  After you shampoo, rinse your hair and body thoroughly to remove the  shampoo.                           4.  Use CHG as you  would any other liquid soap.  You can apply chg directly  to the skin and wash                       Gently with a scrungie or clean washcloth.  5.  Apply the CHG Soap to your body ONLY FROM THE NECK DOWN.   Do not use on face/ open                           Wound or open sores. Avoid contact with eyes, ears mouth and genitals (private parts).                       Wash face,  Genitals (private parts) with your normal soap.             6.  Wash thoroughly, paying special attention to the area where your surgery  will be performed.  7.  Thoroughly rinse your body with warm water from the neck down.  8.  DO NOT shower/wash with your normal soap after  using and rinsing off  the CHG Soap.                9.  Pat yourself dry with a clean towel.            10.  Wear clean pajamas.            11.  Place clean sheets on your bed the night of your first shower and do not  sleep with pets. Day of Surgery : Do not apply any lotions/deodorants the morning of surgery.  Please wear clean clothes to the hospital/surgery center.  FAILURE TO FOLLOW THESE INSTRUCTIONS MAY RESULT IN THE CANCELLATION OF YOUR SURGERY PATIENT SIGNATURE_________________________________  NURSE SIGNATURE__________________________________  ________________________________________________________________________  WHAT IS A BLOOD TRANSFUSION? Blood Transfusion Information  A transfusion is the replacement of blood or some of its parts. Blood is made up of multiple cells which provide different functions.  Red blood cells carry oxygen and are used for blood loss replacement.  White blood cells fight against infection.  Platelets control bleeding.  Plasma helps clot blood.  Other blood products are available for specialized needs, such as hemophilia or other clotting disorders. BEFORE THE TRANSFUSION  Who gives blood for transfusions?   Healthy volunteers who are fully evaluated to make sure their blood is safe. This is blood  bank blood. Transfusion therapy is the safest it has ever been in the practice of medicine. Before blood is taken from a donor, a complete history is taken to make sure that person has no history of diseases nor engages in risky social behavior (examples are intravenous drug use or sexual activity with multiple partners). The donor's travel history is screened to minimize risk of transmitting infections, such as malaria. The donated blood is tested for signs of infectious diseases, such as HIV and hepatitis. The blood is then tested to be sure it is compatible with you in order to minimize the chance of a transfusion reaction. If you or a relative donates blood, this is often done in anticipation of surgery and is not appropriate for emergency situations. It takes many days to process the donated blood. RISKS AND COMPLICATIONS Although transfusion therapy is very safe and saves many lives, the main dangers of transfusion include:   Getting an infectious disease.  Developing a transfusion reaction. This is an allergic reaction to something in the blood you were given. Every precaution is taken to prevent this. The decision to have a blood transfusion has been considered carefully by your caregiver before blood is given. Blood is not given unless the benefits outweigh the risks. AFTER THE TRANSFUSION  Right after receiving a blood transfusion, you will usually feel much better and more energetic. This is especially true if your red blood cells have gotten low (anemic). The transfusion raises the level of the red blood cells which carry oxygen, and this usually causes an energy increase.  The nurse administering the transfusion will monitor you carefully for complications. HOME CARE INSTRUCTIONS  No special instructions are needed after a transfusion. You may find your energy is better. Speak with your caregiver about any limitations on activity for underlying diseases you may have. SEEK MEDICAL CARE  IF:   Your condition is not improving after your transfusion.  You develop redness or irritation at the intravenous (IV) site. SEEK IMMEDIATE MEDICAL CARE IF:  Any of the following symptoms occur over the next 12 hours:  Shaking chills.  You have a temperature by mouth above 102 F (  38.9 C), not controlled by medicine.  Chest, back, or muscle pain.  People around you feel you are not acting correctly or are confused.  Shortness of breath or difficulty breathing.  Dizziness and fainting.  You get a rash or develop hives.  You have a decrease in urine output.  Your urine turns a dark color or changes to pink, red, or brown. Any of the following symptoms occur over the next 10 days:  You have a temperature by mouth above 102 F (38.9 C), not controlled by medicine.  Shortness of breath.  Weakness after normal activity.  The white part of the eye turns yellow (jaundice).  You have a decrease in the amount of urine or are urinating less often.  Your urine turns a dark color or changes to pink, red, or brown. Document Released: 01/20/2000 Document Revised: 04/16/2011 Document Reviewed: 09/08/2007 ExitCare Patient Information 2014 [employer].  _______________________________________________________________________, coughing and deep breathing exercises, leg exercises

## 2013-07-02 ENCOUNTER — Encounter (HOSPITAL_COMMUNITY): Payer: Self-pay | Admitting: Pharmacy Technician

## 2013-07-03 ENCOUNTER — Telehealth: Payer: Self-pay

## 2013-07-03 ENCOUNTER — Encounter (HOSPITAL_COMMUNITY): Payer: Self-pay | Admitting: *Deleted

## 2013-07-03 ENCOUNTER — Encounter (HOSPITAL_COMMUNITY): Payer: Self-pay

## 2013-07-03 ENCOUNTER — Encounter (HOSPITAL_COMMUNITY)
Admission: RE | Admit: 2013-07-03 | Discharge: 2013-07-03 | Disposition: A | Discharge status: Home / Self Care | Payer: Medicaid Other | Source: Ambulatory Visit | Attending: Surgery | Admitting: Surgery

## 2013-07-03 ENCOUNTER — Ambulatory Visit (HOSPITAL_COMMUNITY)
Admission: RE | Admit: 2013-07-03 | Discharge: 2013-07-03 | Disposition: A | Discharge status: Home / Self Care | Payer: Medicaid Other | Source: Ambulatory Visit | Attending: Surgery | Admitting: Surgery

## 2013-07-03 DIAGNOSIS — Z01812 Encounter for preprocedural laboratory examination: Secondary | ICD-10-CM | POA: Insufficient documentation

## 2013-07-03 DIAGNOSIS — Z01811 Encounter for preprocedural respiratory examination: Secondary | ICD-10-CM | POA: Insufficient documentation

## 2013-07-03 HISTORY — DX: Malignant hyperthermia due to anesthesia, initial encounter: T88.3XXA

## 2013-07-03 HISTORY — DX: Anesthesia of skin: R20.0

## 2013-07-03 HISTORY — DX: Family history of other specified conditions: Z84.89

## 2013-07-03 HISTORY — DX: Chronic obstructive pulmonary disease, unspecified: J44.9

## 2013-07-03 HISTORY — DX: Nonrheumatic mitral (valve) prolapse: I34.1

## 2013-07-03 HISTORY — DX: Malignant (primary) neoplasm, unspecified: C80.1

## 2013-07-03 HISTORY — DX: Unspecified osteoarthritis, unspecified site: M19.90

## 2013-07-03 LAB — BASIC METABOLIC PANEL
BUN: 9 mg/dL (ref 6–23)
CALCIUM: 9.2 mg/dL (ref 8.4–10.5)
CO2: 25 mEq/L (ref 19–32)
Chloride: 102 mEq/L (ref 96–112)
Creatinine, Ser: 0.66 mg/dL (ref 0.50–1.10)
GFR calc Af Amer: >90 mL/min (ref >90)
Glucose, Bld: 81 mg/dL (ref 70–99)
Potassium: 4.1 mEq/L (ref 3.7–5.3)
Sodium: 139 mEq/L (ref 137–147)

## 2013-07-03 LAB — CBC
HEMATOCRIT: 36.4 % (ref 36.0–46.0)
Hemoglobin: 12.4 g/dL (ref 12.0–15.0)
MCH: 29.7 pg (ref 26.0–34.0)
MCHC: 34.1 g/dL (ref 30.0–36.0)
MCV: 87.3 fL (ref 78.0–100.0)
Platelets: 322 10*3/uL (ref 150–400)
RBC: 4.17 MIL/uL (ref 3.87–5.11)
RDW: 14 % (ref 11.5–15.5)
WBC: 7.5 10*3/uL (ref 4.0–10.5)

## 2013-07-03 LAB — HCG, SERUM, QUALITATIVE: Preg, Serum: NEGATIVE

## 2013-07-03 NOTE — Anesthesia Preprocedure Evaluation (Addendum)
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 )  Airway Mallampati: II TM Distance: >3 FB Neck ROM: Full    Dental  (+) Teeth Intact, Dental Advisory Given,    Pulmonary shortness of breath, COPDCurrent Smoker,  breath sounds clear to auscultation  Pulmonary exam normal       Cardiovascular hypertension, + Valvular Problems/Murmurs MVP Rhythm:Regular Rate:Normal     Neuro/Psych Anxiety Depression    GI/Hepatic Neg liver ROS, Diverticulitis; chronic pancreatitis.   Endo/Other  negative endocrine ROS  Renal/GU negative Renal ROS     Musculoskeletal negative musculoskeletal ROS (+)   Abdominal   Peds  Hematology negative hematology ROS (+)   Anesthesia Other Findings Patient presents today with lab work from prior surgical procedure which would be indicative of Pseudocholinesterase deficiency complicating prior anesthetic.  Reproductive/Obstetrics negative OB ROS                       Anesthesia Physical Anesthesia Plan  ASA: II  Anesthesia Plan: General   Post-op Pain Management:    Induction: Intravenous  Airway Management Planned: Oral ETT  Additional Equipment:   Intra-op Plan:   Post-operative Plan: Extubation in OR  Informed Consent: I have reviewed the patients History and Physical, chart, labs and discussed the procedure including the risks, benefits and alternatives for the proposed anesthesia with the patient or authorized representative who has indicated his/her understanding and acceptance.   Dental advisory given  Plan Discussed with: CRNA  Anesthesia Plan Comments:         Anesthesia  Quick Evaluation

## 2013-07-03 NOTE — Progress Notes (Addendum)
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  Also suspected family history of malignant hyperthermia. Anesthesia consult completed.    Placed info in Special Needs column on OR schedule and also notified OR.  Spoke with Magda Paganini in [place] and made her aware.  Malignant Hyperthermia placed on front of chart

## 2013-07-03 NOTE — Telephone Encounter (Signed)
FYI coming in for PV on Monday June 1st.  Note allergies: major reactions to anesthetics (propofol not mentioned).  Procedure is scheduled for 6/10.

## 2013-07-03 NOTE — Telephone Encounter (Signed)
Thanks

## 2013-07-07 ENCOUNTER — Ambulatory Visit (AMBULATORY_SURGERY_CENTER): Payer: Self-pay

## 2013-07-07 VITALS — Ht 65.0 in | Wt 139.2 lb

## 2013-07-07 DIAGNOSIS — K632 Fistula of intestine: Secondary | ICD-10-CM

## 2013-07-07 MED ORDER — NA SULFATE-K SULFATE-MG SULF 17.5-3.13-1.6 GM/177ML PO SOLN: ORAL | Status: DC

## 2013-07-07 NOTE — Progress Notes (Signed)
Per pt, no allergies to soy or egg products.Pt not taking any weight loss meds or using  O2 at home. 

## 2013-07-10 ENCOUNTER — Other Ambulatory Visit: Payer: Self-pay

## 2013-07-10 MED ORDER — PROPRANOLOL HCL 10 MG PO TABS: 10.0000 mg | ORAL_TABLET | Freq: Every morning | ORAL | Status: DC

## 2013-07-15 ENCOUNTER — Encounter: Payer: Self-pay | Admitting: Internal Medicine

## 2013-07-15 ENCOUNTER — Encounter (HOSPITAL_COMMUNITY): Payer: Self-pay | Admitting: *Deleted

## 2013-07-15 ENCOUNTER — Ambulatory Visit (AMBULATORY_SURGERY_CENTER): Payer: Medicaid Other | Admitting: Internal Medicine

## 2013-07-15 VITALS — BP 124/59 | HR 75 | Temp 98.7°F | Resp 15 | Ht 65.0 in | Wt 139.0 lb

## 2013-07-15 DIAGNOSIS — K5732 Diverticulitis of large intestine without perforation or abscess without bleeding: Secondary | ICD-10-CM

## 2013-07-15 DIAGNOSIS — Z01818 Encounter for other preprocedural examination: Secondary | ICD-10-CM

## 2013-07-15 DIAGNOSIS — E8809 Other disorders of plasma-protein metabolism, not elsewhere classified: Secondary | ICD-10-CM

## 2013-07-15 DIAGNOSIS — K632 Fistula of intestine: Secondary | ICD-10-CM

## 2013-07-15 HISTORY — PX: COLONOSCOPY: SHX174

## 2013-07-15 HISTORY — DX: Other disorders of plasma-protein metabolism, not elsewhere classified: E88.09

## 2013-07-15 MED ORDER — SODIUM CHLORIDE 0.9 % IV SOLN: 500.0000 mL | INTRAVENOUS | Status: DC

## 2013-07-15 NOTE — Patient Instructions (Addendum)
I found diverticulosis but no other problems.  Proceed with surgery tomorrow.  Please stay on liquids or whatever diet Dr. Johney Maine told you to follow.  Next routine colonoscopy in 10 years - 2025  Good Luck!  I appreciate the opportunity to care for you. Gatha Mayer, MD, FACG  YOU HAD AN ENDOSCOPIC PROCEDURE TODAY AT [facility]: Refer to the procedure report that was given to you for any specific questions about what was found during the examination.  If the procedure report does not answer your questions, please call your gastroenterologist to clarify.  If you requested that your care partner not be given the details of your procedure findings, then the procedure report has been included in a sealed envelope for you to review at your convenience later.  YOU SHOULD EXPECT: Some feelings of bloating in the abdomen. Passage of more gas than usual.  Walking can help get rid of the air that was put into your GI tract during the procedure and reduce the bloating. If you had a lower endoscopy (such as a colonoscopy or flexible sigmoidoscopy) you may notice spotting of blood in your stool or on the toilet paper. If you underwent a bowel prep for your procedure, then you may not have a normal bowel movement for a few days.  DIET: Your first meal following the procedure should be a light meal and then it is ok to progress to your normal diet.  A half-sandwich or bowl of soup is an example of a good first meal.  Heavy or fried foods are harder to digest and may make you feel nauseous or bloated.  Likewise meals heavy in dairy and vegetables can cause extra gas to form and this can also increase the bloating.  Drink plenty of fluids but you should avoid alcoholic beverages for 24 hours.  ACTIVITY: Your care partner should take you home directly after the procedure.  You should plan to take it easy, moving slowly for the rest of the day.  You can resume normal activity the day after the  procedure however you should NOT DRIVE or use heavy machinery for 24 hours (because of the sedation medicines used during the test).    SYMPTOMS TO REPORT IMMEDIATELY: A gastroenterologist can be reached at any hour.  During normal business hours, 8:30 AM to 5:00 PM Monday through Friday, call [phone].  After hours and on weekends, please call the GI answering service at [phone] who will take a message and have the physician on call contact you.   Following lower endoscopy (colonoscopy or flexible sigmoidoscopy):  Excessive amounts of blood in the stool  Significant tenderness or worsening of abdominal pains  Swelling of the abdomen that is new, acute  Fever of 100F or higher   FOLLOW UP: If any biopsies were taken you will be contacted by phone or by letter within the next 1-3 weeks.  Call your gastroenterologist if you have not heard about the biopsies in 3 weeks.  Our staff will call the home number listed on your records the next business day following your procedure to check on you and address any questions or concerns that you may have at that time regarding the information given to you following your procedure. This is a courtesy call and so if there is no answer at the home number and we have not heard from you through the emergency physician on call, we will assume that you have returned to your regular  daily activities without incident.  SIGNATURES/CONFIDENTIALITY: You and/or your care partner have signed paperwork which will be entered into your electronic medical record.  These signatures attest to the fact that that the information above on your After Visit Summary has been reviewed and is understood.  Full responsibility of the confidentiality of this discharge information lies with you and/or your care-partner.  Diverticulosis-handout given  Repeat colonoscopy in 10 years-2025  Proceed with surgery tomorrow.

## 2013-07-15 NOTE — Op Note (Signed)
[place]  [address] [place], [address]   COLONOSCOPY PROCEDURE REPORT  PATIENT: Tracey Fields, Tracey Fields  MR#: [id number] BIRTHDATE: [birth year] , 59  yrs. old GENDER: Female ENDOSCOPIST: Gatha Mayer, MD, [facility] REFERRED VW:PVXYIA Gross, M.D. PROCEDURE DATE:  07/15/2013 PROCEDURE:   Colonoscopy, diagnostic First Screening Colonoscopy - Avg.  risk and is 50 yrs.  old or older - No.  Prior Negative Screening - Now for repeat screening. N/A  History of Adenoma - Now for follow-up colonoscopy & has been > or = to 3 yrs.  N/A  Polyps Removed Today? No.  Recommend repeat exam, <10 yrs? No. ASA CLASS:   Class III INDICATIONS:pre-operative - diverticulitis with abscess and fistula.  MEDICATIONS: propofol (Diprivan) 250mg  IV, MAC sedation, administered by CRNA, and These medications were titrated to patient response per physician's verbal order  DESCRIPTION OF PROCEDURE:   After the risks benefits and alternatives of the procedure were thoroughly explained, informed consent was obtained.  A digital rectal exam revealed no abnormalities of the rectum.   The LB [id number] and LB [id number]  endoscope was introduced through the anus and advanced to the cecum, which was identified by both the appendix and ileocecal valve. No adverse events experienced.   The quality of the prep was Suprep good  The instrument was then slowly withdrawn as the colon was fully examined.  COLON FINDINGS: Severe diverticulosis was noted in the sigmoid colon.   Mild diverticulosis was noted The finding was in the right colon and descending colon.   The colon mucosa was otherwise normal.   A right colon retroflexion was performed.  Retroflexed views revealed no abnormalities. The time to cecum=3 minutes 10 seconds.  Withdrawal time=8 minutes 10 seconds.  The scope was withdrawn and the procedure completed. COMPLICATIONS: There were no complications.  ENDOSCOPIC IMPRESSION: 1.    Severe diverticulosis was noted in the sigmoid colon 2.   Mild diverticulosis was noted in the right colon and descending colon 3.   The colon mucosa was otherwise normal - good prep  RECOMMENDATIONS: 1.  Repeat colonoscopy 10 years. 2.   Proceed with surgery tomorrow.   eSigned:  Gatha Mayer, MD, [facility] 07/15/2013 2:50 PM   cc: Michael Boston, MD and The Patient

## 2013-07-15 NOTE — Progress Notes (Signed)
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  Also informed that was noted in [place] on OR SCHEDULE along with bold sheet on front of chart.  Tracey Fields stated she would make sure it was highlighted for OR.

## 2013-07-15 NOTE — Progress Notes (Signed)
Patient had colonoscopy done on 07/15/2013.

## 2013-07-16 ENCOUNTER — Encounter (HOSPITAL_COMMUNITY)
Admission: RE | Disposition: A | Discharge status: Home / Self Care | Payer: Self-pay | Source: Ambulatory Visit | Attending: Surgery

## 2013-07-16 ENCOUNTER — Encounter (HOSPITAL_COMMUNITY): Payer: Self-pay | Admitting: *Deleted

## 2013-07-16 ENCOUNTER — Ambulatory Visit (HOSPITAL_COMMUNITY): Payer: Medicaid Other | Admitting: Anesthesiology

## 2013-07-16 ENCOUNTER — Encounter (HOSPITAL_COMMUNITY): Payer: Medicaid Other | Admitting: Anesthesiology

## 2013-07-16 ENCOUNTER — Inpatient Hospital Stay (HOSPITAL_COMMUNITY)
Admission: RE | Admit: 2013-07-16 | Discharge: 2013-07-21 | DRG: 330 | Disposition: A | Discharge status: Home / Self Care | Payer: Medicaid Other | Source: Ambulatory Visit | Attending: Surgery | Admitting: Surgery

## 2013-07-16 ENCOUNTER — Telehealth: Payer: Self-pay | Admitting: *Deleted

## 2013-07-16 DIAGNOSIS — Z720 Tobacco use: Secondary | ICD-10-CM | POA: Diagnosis present

## 2013-07-16 DIAGNOSIS — K5720 Diverticulitis of large intestine with perforation and abscess without bleeding: Secondary | ICD-10-CM | POA: Diagnosis present

## 2013-07-16 DIAGNOSIS — K5730 Diverticulosis of large intestine without perforation or abscess without bleeding: Secondary | ICD-10-CM

## 2013-07-16 DIAGNOSIS — J449 Chronic obstructive pulmonary disease, unspecified: Secondary | ICD-10-CM | POA: Diagnosis present

## 2013-07-16 DIAGNOSIS — E8809 Other disorders of plasma-protein metabolism, not elsewhere classified: Secondary | ICD-10-CM | POA: Diagnosis present

## 2013-07-16 DIAGNOSIS — Z8249 Family history of ischemic heart disease and other diseases of the circulatory system: Secondary | ICD-10-CM

## 2013-07-16 DIAGNOSIS — J4489 Other specified chronic obstructive pulmonary disease: Secondary | ICD-10-CM | POA: Diagnosis present

## 2013-07-16 DIAGNOSIS — F329 Major depressive disorder, single episode, unspecified: Secondary | ICD-10-CM | POA: Diagnosis present

## 2013-07-16 DIAGNOSIS — F3289 Other specified depressive episodes: Secondary | ICD-10-CM | POA: Diagnosis present

## 2013-07-16 DIAGNOSIS — Z801 Family history of malignant neoplasm of trachea, bronchus and lung: Secondary | ICD-10-CM

## 2013-07-16 DIAGNOSIS — I10 Essential (primary) hypertension: Secondary | ICD-10-CM | POA: Diagnosis present

## 2013-07-16 DIAGNOSIS — K5732 Diverticulitis of large intestine without perforation or abscess without bleeding: Principal | ICD-10-CM | POA: Diagnosis present

## 2013-07-16 DIAGNOSIS — F411 Generalized anxiety disorder: Secondary | ICD-10-CM | POA: Diagnosis present

## 2013-07-16 DIAGNOSIS — K861 Other chronic pancreatitis: Secondary | ICD-10-CM | POA: Diagnosis present

## 2013-07-16 DIAGNOSIS — D7589 Other specified diseases of blood and blood-forming organs: Secondary | ICD-10-CM | POA: Diagnosis present

## 2013-07-16 DIAGNOSIS — F17200 Nicotine dependence, unspecified, uncomplicated: Secondary | ICD-10-CM | POA: Diagnosis present

## 2013-07-16 DIAGNOSIS — K632 Fistula of intestine: Secondary | ICD-10-CM

## 2013-07-16 DIAGNOSIS — G4700 Insomnia, unspecified: Secondary | ICD-10-CM | POA: Diagnosis present

## 2013-07-16 DIAGNOSIS — K630 Abscess of intestine: Secondary | ICD-10-CM | POA: Diagnosis present

## 2013-07-16 LAB — ABO/RH: ABO/RH(D): A POS

## 2013-07-16 LAB — TYPE AND SCREEN
ABO/RH(D): A POS
ANTIBODY SCREEN: NEGATIVE

## 2013-07-16 SURGERY — ROBOT ASSISTED LAPAROSCOPIC PARTIAL COLECTOMY
Anesthesia: General | Site: Abdomen

## 2013-07-16 MED ORDER — METOPROLOL TARTRATE 1 MG/ML IV SOLN: 5.0000 mg | Freq: Four times a day (QID) | INTRAVENOUS | Status: DC | PRN

## 2013-07-16 MED ORDER — STERILE WATER FOR IRRIGATION IR SOLN
Status: DC | PRN
  Administered 2013-07-16: 1500 mL

## 2013-07-16 MED ORDER — HYDROMORPHONE HCL PF 1 MG/ML IJ SOLN
INTRAMUSCULAR | Status: AC
  Filled 2013-07-16: qty 1

## 2013-07-16 MED ORDER — VENLAFAXINE HCL ER 150 MG PO CP24
150.0000 mg | ORAL_CAPSULE | Freq: Every day | ORAL | Status: DC
  Filled 2013-07-16 (×2): qty 1

## 2013-07-16 MED ORDER — ALPRAZOLAM 0.25 MG PO TABS
0.2500 mg | ORAL_TABLET | Freq: Three times a day (TID) | ORAL | Status: DC | PRN
  Administered 2013-07-17 – 2013-07-20 (×7): 0.25 mg via ORAL
  Filled 2013-07-16 (×7): qty 1

## 2013-07-16 MED ORDER — ROCURONIUM BROMIDE 100 MG/10ML IV SOLN
INTRAVENOUS | Status: DC | PRN
  Administered 2013-07-16: 50 mg via INTRAVENOUS
  Administered 2013-07-16 (×2): 10 mg via INTRAVENOUS
  Administered 2013-07-16: 20 mg via INTRAVENOUS
  Administered 2013-07-16: 10 mg via INTRAVENOUS
  Administered 2013-07-16: 20 mg via INTRAVENOUS
  Administered 2013-07-16: 10 mg via INTRAVENOUS

## 2013-07-16 MED ORDER — ATROPINE SULFATE 0.4 MG/ML IJ SOLN
INTRAMUSCULAR | Status: AC
  Filled 2013-07-16: qty 2

## 2013-07-16 MED ORDER — BUPIVACAINE ON-Q PAIN PUMP (FOR ORDER SET NO CHG)
INJECTION | Status: DC
  Filled 2013-07-16: qty 1

## 2013-07-16 MED ORDER — DEXAMETHASONE SODIUM PHOSPHATE 10 MG/ML IJ SOLN
INTRAMUSCULAR | Status: AC
  Filled 2013-07-16: qty 1

## 2013-07-16 MED ORDER — ASPIRIN EC 81 MG PO TBEC
81.0000 mg | DELAYED_RELEASE_TABLET | Freq: Every day | ORAL | Status: DC
  Administered 2013-07-17 – 2013-07-20 (×4): 81 mg via ORAL
  Filled 2013-07-16 (×6): qty 1

## 2013-07-16 MED ORDER — SODIUM CHLORIDE 0.9 % IV SOLN
INTRAVENOUS | Status: AC
  Administered 2013-07-16: 11:00:00 via INTRAPERITONEAL
  Filled 2013-07-16: qty 6

## 2013-07-16 MED ORDER — DIPHENHYDRAMINE HCL 12.5 MG/5ML PO ELIX: 12.5000 mg | ORAL_SOLUTION | Freq: Four times a day (QID) | ORAL | Status: DC | PRN

## 2013-07-16 MED ORDER — ONDANSETRON HCL 4 MG/2ML IJ SOLN
INTRAMUSCULAR | Status: DC | PRN
  Administered 2013-07-16: 4 mg via INTRAVENOUS

## 2013-07-16 MED ORDER — FENTANYL CITRATE 0.05 MG/ML IJ SOLN
INTRAMUSCULAR | Status: DC | PRN
  Administered 2013-07-16 (×7): 50 ug via INTRAVENOUS

## 2013-07-16 MED ORDER — KETOROLAC TROMETHAMINE 30 MG/ML IJ SOLN
INTRAMUSCULAR | Status: DC | PRN
  Administered 2013-07-16: 30 mg via INTRAVENOUS

## 2013-07-16 MED ORDER — FENTANYL CITRATE 0.05 MG/ML IJ SOLN
INTRAMUSCULAR | Status: AC
  Filled 2013-07-16: qty 2

## 2013-07-16 MED ORDER — DEXAMETHASONE SODIUM PHOSPHATE 10 MG/ML IJ SOLN
INTRAMUSCULAR | Status: DC | PRN
  Administered 2013-07-16: 10 mg via INTRAVENOUS

## 2013-07-16 MED ORDER — ONDANSETRON HCL 4 MG/2ML IJ SOLN
INTRAMUSCULAR | Status: AC
  Filled 2013-07-16: qty 2

## 2013-07-16 MED ORDER — MAGIC MOUTHWASH
15.0000 mL | Freq: Four times a day (QID) | ORAL | Status: DC | PRN
  Filled 2013-07-16: qty 15

## 2013-07-16 MED ORDER — LACTATED RINGERS IV BOLUS (SEPSIS): 1000.0000 mL | Freq: Three times a day (TID) | INTRAVENOUS | Status: AC | PRN

## 2013-07-16 MED ORDER — PROMETHAZINE HCL 25 MG/ML IJ SOLN: 6.2500 mg | INTRAMUSCULAR | Status: DC | PRN

## 2013-07-16 MED ORDER — PROPOFOL 10 MG/ML IV BOLUS
INTRAVENOUS | Status: DC | PRN
Start: 2013-07-16 — End: 2013-07-16
  Administered 2013-07-16: 140 mg via INTRAVENOUS

## 2013-07-16 MED ORDER — GLYCOPYRROLATE 0.2 MG/ML IJ SOLN
INTRAMUSCULAR | Status: AC
  Filled 2013-07-16: qty 2

## 2013-07-16 MED ORDER — HYDROMORPHONE HCL PF 2 MG/ML IJ SOLN
INTRAMUSCULAR | Status: AC
  Filled 2013-07-16: qty 1

## 2013-07-16 MED ORDER — FENTANYL CITRATE 0.05 MG/ML IJ SOLN
25.0000 ug | INTRAMUSCULAR | Status: DC | PRN
  Administered 2013-07-16 – 2013-07-17 (×7): 50 ug via INTRAVENOUS
  Filled 2013-07-16 (×7): qty 2

## 2013-07-16 MED ORDER — PANCRELIPASE (LIP-PROT-AMYL) 36000-114000 UNITS PO CPEP: 36000.0000 [IU] | ORAL_CAPSULE | Freq: Three times a day (TID) | ORAL | Status: DC

## 2013-07-16 MED ORDER — HYDROMORPHONE HCL PF 1 MG/ML IJ SOLN
0.5000 mg | INTRAMUSCULAR | Status: DC | PRN
  Administered 2013-07-16: 1 mg via INTRAVENOUS
  Administered 2013-07-16: 2 mg via INTRAVENOUS
  Administered 2013-07-16: 1 mg via INTRAVENOUS
  Administered 2013-07-16: 2 mg via INTRAVENOUS
  Filled 2013-07-16: qty 1
  Filled 2013-07-16 (×2): qty 2
  Filled 2013-07-16: qty 1

## 2013-07-16 MED ORDER — LIP MEDEX EX OINT
1.0000 "application " | TOPICAL_OINTMENT | Freq: Two times a day (BID) | CUTANEOUS | Status: DC
  Administered 2013-07-16 – 2013-07-21 (×10): 1 via TOPICAL
  Filled 2013-07-16: qty 7

## 2013-07-16 MED ORDER — EPHEDRINE SULFATE 50 MG/ML IJ SOLN
INTRAMUSCULAR | Status: DC | PRN
  Administered 2013-07-16 (×2): 5 mg via INTRAVENOUS

## 2013-07-16 MED ORDER — PROPOFOL 10 MG/ML IV BOLUS
INTRAVENOUS | Status: AC
Start: 2013-07-16 — End: 2013-07-16
  Filled 2013-07-16: qty 20

## 2013-07-16 MED ORDER — ROCURONIUM BROMIDE 100 MG/10ML IV SOLN
INTRAVENOUS | Status: AC
  Filled 2013-07-16: qty 1

## 2013-07-16 MED ORDER — ACETAMINOPHEN 500 MG PO TABS
1000.0000 mg | ORAL_TABLET | Freq: Three times a day (TID) | ORAL | Status: DC
  Administered 2013-07-16: 1000 mg via ORAL
  Filled 2013-07-16 (×6): qty 2

## 2013-07-16 MED ORDER — NAPROXEN 500 MG PO TABS: 500.0000 mg | ORAL_TABLET | Freq: Two times a day (BID) | ORAL | Status: DC

## 2013-07-16 MED ORDER — LACTATED RINGERS IV SOLN: INTRAVENOUS | Status: DC

## 2013-07-16 MED ORDER — 0.9 % SODIUM CHLORIDE (POUR BTL) OPTIME
TOPICAL | Status: DC | PRN
  Administered 2013-07-16: 2000 mL

## 2013-07-16 MED ORDER — BUPIVACAINE-EPINEPHRINE 0.25% -1:200000 IJ SOLN
INTRAMUSCULAR | Status: AC
  Filled 2013-07-16: qty 2

## 2013-07-16 MED ORDER — KCL IN DEXTROSE-NACL 40-5-0.9 MEQ/L-%-% IV SOLN
INTRAVENOUS | Status: DC
  Administered 2013-07-16 – 2013-07-18 (×3): via INTRAVENOUS
  Administered 2013-07-19: 50 mL via INTRAVENOUS
  Filled 2013-07-16 (×8): qty 1000

## 2013-07-16 MED ORDER — PROMETHAZINE HCL 25 MG/ML IJ SOLN: 6.2500 mg | Freq: Four times a day (QID) | INTRAMUSCULAR | Status: DC | PRN

## 2013-07-16 MED ORDER — DEXTROSE 5 % IV SOLN
2.0000 g | INTRAVENOUS | Status: AC
  Administered 2013-07-16: 2 g via INTRAVENOUS
  Filled 2013-07-16: qty 2

## 2013-07-16 MED ORDER — ALVIMOPAN 12 MG PO CAPS
12.0000 mg | ORAL_CAPSULE | Freq: Once | ORAL | Status: AC
  Administered 2013-07-16: 12 mg via ORAL
  Filled 2013-07-16: qty 1

## 2013-07-16 MED ORDER — HEPARIN SODIUM (PORCINE) 5000 UNIT/ML IJ SOLN
5000.0000 [IU] | Freq: Once | INTRAMUSCULAR | Status: AC
  Administered 2013-07-16: 5000 [IU] via SUBCUTANEOUS
  Filled 2013-07-16: qty 1

## 2013-07-16 MED ORDER — LIDOCAINE HCL (CARDIAC) 20 MG/ML IV SOLN
INTRAVENOUS | Status: DC | PRN
  Administered 2013-07-16: 80 mg via INTRAVENOUS

## 2013-07-16 MED ORDER — MIDAZOLAM HCL 5 MG/5ML IJ SOLN
INTRAMUSCULAR | Status: DC | PRN
  Administered 2013-07-16 (×2): 1 mg via INTRAVENOUS

## 2013-07-16 MED ORDER — PHENYLEPHRINE HCL 10 MG/ML IJ SOLN
20.0000 mg | INTRAVENOUS | Status: DC | PRN
  Administered 2013-07-16: 10 ug/min via INTRAVENOUS

## 2013-07-16 MED ORDER — LIDOCAINE HCL (CARDIAC) 20 MG/ML IV SOLN
INTRAVENOUS | Status: AC
  Filled 2013-07-16: qty 5

## 2013-07-16 MED ORDER — SACCHAROMYCES BOULARDII 250 MG PO CAPS
250.0000 mg | ORAL_CAPSULE | Freq: Two times a day (BID) | ORAL | Status: DC
  Administered 2013-07-16 – 2013-07-21 (×10): 250 mg via ORAL
  Filled 2013-07-16 (×11): qty 1

## 2013-07-16 MED ORDER — PANCRELIPASE (LIP-PROT-AMYL) 12000-38000 UNITS PO CPEP
3.0000 | ORAL_CAPSULE | Freq: Three times a day (TID) | ORAL | Status: DC
  Administered 2013-07-16 – 2013-07-21 (×6): 3 via ORAL
  Filled 2013-07-16 (×17): qty 3

## 2013-07-16 MED ORDER — GLYCOPYRROLATE 0.2 MG/ML IJ SOLN
INTRAMUSCULAR | Status: DC | PRN
  Administered 2013-07-16: .4 mg via INTRAVENOUS

## 2013-07-16 MED ORDER — LORATADINE 10 MG PO TABS
10.0000 mg | ORAL_TABLET | Freq: Every day | ORAL | Status: DC
  Administered 2013-07-16 – 2013-07-21 (×6): 10 mg via ORAL
  Filled 2013-07-16 (×6): qty 1

## 2013-07-16 MED ORDER — PHENYLEPHRINE HCL 10 MG/ML IJ SOLN
INTRAMUSCULAR | Status: AC
Start: 2013-07-16 — End: 2013-07-16
  Filled 2013-07-16: qty 2

## 2013-07-16 MED ORDER — ZOLPIDEM TARTRATE 5 MG PO TABS
5.0000 mg | ORAL_TABLET | Freq: Every evening | ORAL | Status: DC | PRN
  Administered 2013-07-17 – 2013-07-20 (×4): 5 mg via ORAL
  Filled 2013-07-16 (×5): qty 1

## 2013-07-16 MED ORDER — ALUM & MAG HYDROXIDE-SIMETH 200-200-20 MG/5ML PO SUSP
30.0000 mL | Freq: Four times a day (QID) | ORAL | Status: DC | PRN
  Administered 2013-07-16: 30 mL via ORAL
  Filled 2013-07-16: qty 30

## 2013-07-16 MED ORDER — HYDROMORPHONE HCL PF 1 MG/ML IJ SOLN
0.2500 mg | INTRAMUSCULAR | Status: DC | PRN
  Administered 2013-07-16 (×4): 0.5 mg via INTRAVENOUS

## 2013-07-16 MED ORDER — NEOSTIGMINE METHYLSULFATE 10 MG/10ML IV SOLN
INTRAVENOUS | Status: DC | PRN
  Administered 2013-07-16: 3 mg via INTRAVENOUS

## 2013-07-16 MED ORDER — BUPIVACAINE 0.25 % ON-Q PUMP DUAL CATH 300 ML
300.0000 mL | INJECTION | Status: AC
  Filled 2013-07-16: qty 300

## 2013-07-16 MED ORDER — BUPIVACAINE HCL (PF) 0.25 % IJ SOLN
INTRAMUSCULAR | Status: AC
  Filled 2013-07-16: qty 90

## 2013-07-16 MED ORDER — DIPHENHYDRAMINE HCL 50 MG/ML IJ SOLN
12.5000 mg | Freq: Four times a day (QID) | INTRAMUSCULAR | Status: DC | PRN
Start: 2013-07-16 — End: 2013-07-21

## 2013-07-16 MED ORDER — HEPARIN SODIUM (PORCINE) 5000 UNIT/ML IJ SOLN
5000.0000 [IU] | Freq: Three times a day (TID) | INTRAMUSCULAR | Status: DC
  Administered 2013-07-17 – 2013-07-21 (×13): 5000 [IU] via SUBCUTANEOUS
  Filled 2013-07-16 (×18): qty 1

## 2013-07-16 MED ORDER — BUPIVACAINE-EPINEPHRINE 0.25% -1:200000 IJ SOLN
INTRAMUSCULAR | Status: DC | PRN
  Administered 2013-07-16: 60 mL

## 2013-07-16 MED ORDER — SODIUM CHLORIDE 0.9 % IJ SOLN
INTRAMUSCULAR | Status: AC
  Filled 2013-07-16: qty 10

## 2013-07-16 MED ORDER — FENTANYL CITRATE 0.05 MG/ML IJ SOLN
INTRAMUSCULAR | Status: AC
  Filled 2013-07-16: qty 5

## 2013-07-16 MED ORDER — OXYCODONE HCL 5 MG PO TABS: 5.0000 mg | ORAL_TABLET | ORAL | Status: DC | PRN

## 2013-07-16 MED ORDER — ALVIMOPAN 12 MG PO CAPS
12.0000 mg | ORAL_CAPSULE | Freq: Two times a day (BID) | ORAL | Status: DC
  Administered 2013-07-17 – 2013-07-20 (×7): 12 mg via ORAL
  Filled 2013-07-16 (×8): qty 1

## 2013-07-16 MED ORDER — PROPRANOLOL HCL 10 MG PO TABS
10.0000 mg | ORAL_TABLET | Freq: Every morning | ORAL | Status: DC
  Administered 2013-07-17 – 2013-07-21 (×5): 10 mg via ORAL
  Filled 2013-07-16 (×5): qty 1

## 2013-07-16 MED ORDER — EPHEDRINE SULFATE 50 MG/ML IJ SOLN
INTRAMUSCULAR | Status: AC
  Filled 2013-07-16: qty 1

## 2013-07-16 MED ORDER — LACTATED RINGERS IV SOLN
INTRAVENOUS | Status: DC | PRN
  Administered 2013-07-16: 07:00:00 via INTRAVENOUS

## 2013-07-16 MED ORDER — PHENOL 1.4 % MT LIQD: 2.0000 | OROMUCOSAL | Status: DC | PRN

## 2013-07-16 MED ORDER — MIDAZOLAM HCL 2 MG/2ML IJ SOLN
INTRAMUSCULAR | Status: AC
  Filled 2013-07-16: qty 2

## 2013-07-16 MED ORDER — HYDROMORPHONE HCL PF 1 MG/ML IJ SOLN
INTRAMUSCULAR | Status: DC | PRN
  Administered 2013-07-16: 0.5 mg via INTRAVENOUS
  Administered 2013-07-16: 1 mg via INTRAVENOUS
  Administered 2013-07-16: 0.5 mg via INTRAVENOUS

## 2013-07-16 MED ORDER — BUPIVACAINE 0.25 % ON-Q PUMP DUAL CATH 300 ML
300.0000 mL | INJECTION | Status: DC
  Filled 2013-07-16: qty 300

## 2013-07-16 MED ORDER — DEXTROSE 5 % IV SOLN
2.0000 g | Freq: Two times a day (BID) | INTRAVENOUS | Status: AC
  Administered 2013-07-16: 2 g via INTRAVENOUS
  Filled 2013-07-16: qty 2

## 2013-07-16 MED ORDER — MENTHOL 3 MG MT LOZG: 1.0000 | LOZENGE | OROMUCOSAL | Status: DC | PRN

## 2013-07-16 MED ORDER — KETOROLAC TROMETHAMINE 30 MG/ML IJ SOLN
INTRAMUSCULAR | Status: AC
  Filled 2013-07-16: qty 1

## 2013-07-16 SURGICAL SUPPLY — 105 items
APPLIER CLIP 5 13 M/L LIGAMAX5 (MISCELLANEOUS)
APPLIER CLIP ROT 10 11.4 M/L (STAPLE)
APR CLP MED LRG 11.4X10 (STAPLE)
APR CLP MED LRG 5 ANG JAW (MISCELLANEOUS)
BAG URINE DRAINAGE (UROLOGICAL SUPPLIES) ×3 IMPLANT
BLADE EXTENDED COATED 6.5IN (ELECTRODE) ×3 IMPLANT
BLADE HEX COATED 2.75 (ELECTRODE) ×6 IMPLANT
BLADE SURG SZ10 CARB STEEL (BLADE) ×3 IMPLANT
BLADE SURG SZ11 CARB STEEL (BLADE) ×3 IMPLANT
CABLE HIGH FREQUENCY MONO STRZ (ELECTRODE) IMPLANT
CANISTER SUCTION 2500CC (MISCELLANEOUS) ×3 IMPLANT
CATH FOLEY 2WAY SLVR 30CC 24FR (CATHETERS) ×1 IMPLANT
CATH KIT ON Q 7.5IN SLV (PAIN MANAGEMENT) IMPLANT
CATH ROBINSON RED A/P 16FR (CATHETERS) IMPLANT
CELLS DAT CNTRL 66122 CELL SVR (MISCELLANEOUS) ×1 IMPLANT
CHLORAPREP W/TINT 26ML (MISCELLANEOUS) ×3 IMPLANT
CLIP APPLIE 5 13 M/L LIGAMAX5 (MISCELLANEOUS) IMPLANT
CLIP APPLIE ROT 10 11.4 M/L (STAPLE) IMPLANT
CLIP LIGATING HEM O LOK PURPLE (MISCELLANEOUS) ×3 IMPLANT
CLIP LIGATING HEMO O LOK GREEN (MISCELLANEOUS) IMPLANT
CLIP LIGATING HEMOLOK MED (MISCELLANEOUS) IMPLANT
COVER MAYO STAND STRL (DRAPES) ×3 IMPLANT
COVER TIP SHEARS 8 DVNC (MISCELLANEOUS) ×1 IMPLANT
COVER TIP SHEARS 8MM DA VINCI (MISCELLANEOUS) ×2
DECANTER SPIKE VIAL GLASS SM (MISCELLANEOUS) ×6 IMPLANT
DEVICE TROCAR PUNCTURE CLOSURE (ENDOMECHANICALS) ×1 IMPLANT
DRAIN CHANNEL 19F RND (DRAIN) IMPLANT
DRAPE ARM DVNC X/XI (DISPOSABLE) ×4 IMPLANT
DRAPE CAMERA CLOSED 9X96 (DRAPES) IMPLANT
DRAPE COLUMN DVNC XI (DISPOSABLE) ×1 IMPLANT
DRAPE DA VINCI XI ARM (DISPOSABLE) ×10
DRAPE DA VINCI XI COLUMN (DISPOSABLE) ×2
DRAPE LG THREE QUARTER DISP (DRAPES) ×3 IMPLANT
DRAPE SURG IRRIG POUCH 19X23 (DRAPES) ×3 IMPLANT
DRAPE UTILITY XL STRL (DRAPES) ×3 IMPLANT
DRAPE WARM FLUID 44X44 (DRAPE) ×6 IMPLANT
DRSG OPSITE POSTOP 4X10 (GAUZE/BANDAGES/DRESSINGS) IMPLANT
DRSG OPSITE POSTOP 4X6 (GAUZE/BANDAGES/DRESSINGS) ×2 IMPLANT
DRSG OPSITE POSTOP 4X8 (GAUZE/BANDAGES/DRESSINGS) IMPLANT
DRSG TEGADERM 2-3/8X2-3/4 SM (GAUZE/BANDAGES/DRESSINGS) ×4 IMPLANT
DRSG TEGADERM 4X4.75 (GAUZE/BANDAGES/DRESSINGS) ×2 IMPLANT
DRSG TEGADERM 6X8 (GAUZE/BANDAGES/DRESSINGS) ×1 IMPLANT
ELECT REM PT RETURN 9FT ADLT (ELECTROSURGICAL) ×3
ELECTRODE REM PT RTRN 9FT ADLT (ELECTROSURGICAL) ×1 IMPLANT
ENDOLOOP SUT PDS II  0 18 (SUTURE)
ENDOLOOP SUT PDS II 0 18 (SUTURE) IMPLANT
GAUZE SPONGE 4X4 12PLY STRL (GAUZE/BANDAGES/DRESSINGS) IMPLANT
GLOVE ECLIPSE 8.0 STRL XLNG CF (GLOVE) ×9 IMPLANT
GLOVE INDICATOR 8.0 STRL GRN (GLOVE) ×9 IMPLANT
GOWN STRL REUS W/TWL 2XL LVL3 (GOWN DISPOSABLE) ×9 IMPLANT
GOWN STRL REUS W/TWL XL LVL3 (GOWN DISPOSABLE) ×15 IMPLANT
KIT BASIN OR (CUSTOM PROCEDURE TRAY) ×6 IMPLANT
LEGGING LITHOTOMY PAIR STRL (DRAPES) ×3 IMPLANT
LUBRICANT JELLY K Y 4OZ (MISCELLANEOUS) ×1 IMPLANT
NDL INSUFFLATION 14GA 120MM (NEEDLE) ×1 IMPLANT
NEEDLE HYPO 22GX1.5 SAFETY (NEEDLE) ×3 IMPLANT
NEEDLE INSUFFLATION 14GA 120MM (NEEDLE) ×3 IMPLANT
PACK CARDIOVASCULAR III (CUSTOM PROCEDURE TRAY) ×3 IMPLANT
PACK GENERAL/GYN (CUSTOM PROCEDURE TRAY) ×3 IMPLANT
PENCIL BUTTON HOLSTER BLD 10FT (ELECTRODE) ×3 IMPLANT
PUMP PAIN ON-Q (MISCELLANEOUS) IMPLANT
RETRACTOR WND ALEXIS 18 MED (MISCELLANEOUS) IMPLANT
RTRCTR WOUND ALEXIS 18CM MED (MISCELLANEOUS) ×3
SCISSORS LAP 5X35 DISP (ENDOMECHANICALS) IMPLANT
SCRUB PCMX 4 OZ (MISCELLANEOUS) ×3 IMPLANT
SEAL CANN UNIV 5-8 DVNC XI (MISCELLANEOUS) ×4 IMPLANT
SEAL XI 5MM-8MM UNIVERSAL (MISCELLANEOUS) ×8
SEALER TISSUE G2 STRG ARTC 35C (ENDOMECHANICALS) IMPLANT
SEALER VESSEL DA VINCI XI (MISCELLANEOUS)
SEALER VESSEL EXT DVNC XI (MISCELLANEOUS) IMPLANT
SET IRRIG TUBING LAPAROSCOPIC (IRRIGATION / IRRIGATOR) IMPLANT
SLEEVE XCEL OPT CAN 5 100 (ENDOMECHANICALS) IMPLANT
SOLUTION ELECTROLUBE (MISCELLANEOUS) ×3 IMPLANT
SPONGE GAUZE 4X4 12PLY (GAUZE/BANDAGES/DRESSINGS) ×2 IMPLANT
SPONGE LAP 18X18 X RAY DECT (DISPOSABLE) IMPLANT
STAPLER CIRC ILS CVD 33MM 37CM (STAPLE) ×2 IMPLANT
STAPLER CUT CVD 40MM BLUE (STAPLE) ×2 IMPLANT
SUCTION POOLE TIP (SUCTIONS) ×5 IMPLANT
SUT MNCRL AB 4-0 PS2 18 (SUTURE) ×3 IMPLANT
SUT PDS AB 1 CTX 36 (SUTURE) ×6 IMPLANT
SUT PDS AB 1 TP1 96 (SUTURE) IMPLANT
SUT PROLENE 0 CT 2 (SUTURE) ×3 IMPLANT
SUT SILK 2 0 (SUTURE) ×3
SUT SILK 2 0 SH CR/8 (SUTURE) ×3 IMPLANT
SUT SILK 2-0 18XBRD TIE 12 (SUTURE) ×1 IMPLANT
SUT SILK 3 0 (SUTURE) ×3
SUT SILK 3 0 SH CR/8 (SUTURE) ×3 IMPLANT
SUT SILK 3-0 18XBRD TIE 12 (SUTURE) ×1 IMPLANT
SUT VICRYL 0 TIES 12 18 (SUTURE) ×3 IMPLANT
SUT VICRYL 0 UR6 27IN ABS (SUTURE) ×3 IMPLANT
SYR 20CC LL (SYRINGE) ×9 IMPLANT
SYR BULB IRRIGATION 50ML (SYRINGE) IMPLANT
SYRINGE 10CC LL (SYRINGE) ×3 IMPLANT
SYS LAPSCP GELPORT 120MM (MISCELLANEOUS)
SYSTEM LAPSCP GELPORT 120MM (MISCELLANEOUS) IMPLANT
TAPE UMBILICAL COTTON 1/8X30 (MISCELLANEOUS) ×3 IMPLANT
TOWEL OR 17X26 10 PK STRL BLUE (TOWEL DISPOSABLE) ×6 IMPLANT
TOWEL OR NON WOVEN STRL DISP B (DISPOSABLE) ×6 IMPLANT
TRAY FOLEY CATH 14FRSI W/METER (CATHETERS) ×3 IMPLANT
TROCAR BLADELESS OPT 5 100 (ENDOMECHANICALS) ×3 IMPLANT
TUBING CONNECTING 10 (TUBING) IMPLANT
TUBING CONNECTING 10' (TUBING)
TUBING FILTER THERMOFLATOR (ELECTROSURGICAL) ×3 IMPLANT
TUNNELER SHEATH ON-Q 16GX12 DP (PAIN MANAGEMENT) ×2 IMPLANT
YANKAUER SUCT BULB TIP 10FT TU (MISCELLANEOUS) ×3 IMPLANT

## 2013-07-16 NOTE — Transfer of Care (Signed)
Immediate Anesthesia Transfer of Care Note  Patient: Tracey Fields  Procedure(s) Performed: Procedure(s): ROBOT ASSISTED LAPAROSCOPIC LEFT COLECTOMY SPLENIC FLEXURE IMMOBILIZATION WITH RIGID PROCTOSCOPY (N/A)  Patient Location: PACU  Anesthesia Type:General  Level of Consciousness: awake, alert , oriented and patient cooperative  Airway & Oxygen Therapy: Patient Spontanous Breathing and Patient connected to face mask oxygen  Post-op Assessment: Report given to PACU RN, Post -op Vital signs reviewed and stable and Patient moving all extremities  Post vital signs: Reviewed and stable  Complications: No apparent anesthesia complications

## 2013-07-16 NOTE — OR Nursing (Signed)
Spoke with Altha Harm in pharmacy regarding warning pop-up with a possible contraindication gent/cleocin antibiotic intraperitoneal irrigation, patient has a succinylcholine allergy. Altha Harm said she would review the order. Mohammed Kindle, RN.

## 2013-07-16 NOTE — Progress Notes (Signed)
Patient has a drainage tube from Lt lower abdominal area connected to leg bag draining green colored liquid covered with gauze dressing which is dry and in place on arrival to short stay

## 2013-07-16 NOTE — H&P (View-Only) (Signed)
Subjective:     Patient ID: Tracey Fields, female   DOB: Sep 27, 1960, 53 y.o.   MRN: [id number]  HPI   Note: This dictation was prepared with Dragon/digital dictation along with [facility] technology. Any transcriptional errors that result from this process are unintentional.       Tracey Fields  12/12/1960 [id number]  Patient Care Team: Biagio Borg, MD as PCP - General  This patient is a 53 y.o.female who presents today for surgical evaluation at the request of Dr. Donne Hazel.   Reason for visit: Follow up on diverticulitis with abscess status post hospital admission and drain  Female with recurrent episodes of diverticulitis treated with antibiotics in the past.  Developed more severe attack with perforation and abscess.  Was admitted.  Drain placed.  Transitioned to outpatient therapy and discharged home.  Discharge 2 weeks ago.  She comes here for followup.  She was due to follow up with Dr. Donne Hazel but due to family emergency he is not available so I am seeing the patient.    She continues to smoke but trying to quit.  Her pain is gone down.  She has been having a bland diet.  Some mild loose stools but not severe.  She comes today with her niece.  An exit site of the drain is somewhat underlying with a little bit of drainage.  Her pricker concern is a deep left groin pain when she sits.  Some intermittent episodes of nausea.  She will force herself to vomit.  Otherwise she is tolerating solid foods.  Bowels a little irregular but that is her baseline.  Continues on oral antibiotics  Patient Active Problem List   Diagnosis Date Noted  . Tobacco abuse 05/21/2013  . 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 Oral candida 05/01/2013  . Diverticulitis of large intestine with perforation and abscess 04/25/2013  . Alcohol intake above recommended sensible limits with complication 09/47/0962  . Chronic pancreatitis 07/29/2012  . COPD (chronic obstructive pulmonary disease)  04/01/2012  . 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 Insomnia 04/01/2012  . Preventative health care 10/19/2010  . Impaired glucose tolerance 10/19/2010  . BACK PAIN 01/12/2009  . HOARSENESS, CHRONIC 08/13/2008  . FATIGUE 07/14/2008  . DYSPNEA 06/02/2008  . DEPRESSION 12/29/2007  . HYPERTENSION 12/29/2007  . DIVERTICULOSIS, COLON 12/29/2007  . ANXIETY 09/21/2006  . 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 Irritable bowel syndrome 09/21/2006  . COUGH, CHRONIC 09/21/2006  . HEMORRHOIDS, HX OF 09/21/2006    Past Medical History  Diagnosis Date  . Abdominal pain, left lower quadrant 02/24/2010  . ANXIETY 09/21/2006  . BACK PAIN 01/12/2009  . COUGH, CHRONIC 09/21/2006  . DEPRESSION 12/29/2007  . DIVERTICULOSIS, COLON 12/29/2007  . DYSPNEA 06/02/2008  . FATIGUE 07/14/2008  . HEMORRHOIDS, HX OF 09/21/2006  . HOARSENESS, CHRONIC 08/13/2008  . HYPERTENSION 12/29/2007  . Irritable bowel syndrome 09/21/2006  . 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 SORE THROAT 07/14/2008  . Wheezing 05/18/2009  . Impaired glucose tolerance 10/19/2010  . Chronic pancreatitis 07/29/2012    Past Surgical History  Procedure Laterality Date  . Fractured jaw  1980    required wiring  . Colonoscopy    . Upper gastrointestinal endoscopy      History   Social History  . Marital Status: Divorced    Spouse Name: N/A    Number of Children: 2  . Years of Education: N/A   Occupational History  . independent Estate agent  History Main Topics  . Smoking status: Current Every Day Smoker -- 1.00 packs/day    Types: Cigarettes  . Smokeless tobacco: Never Used     Comment: 1 ppd, patient has been counseled to quit smoking 08/13/08  . Alcohol Use: Yes     Comment: social  . Drug Use: No  . Sexual Activity: Yes   Other Topics Concern  . Not on file   Social History Narrative  . No narrative on file    Family History  Problem Relation Age of Onset  . Lung cancer Father   . Diabetes  Maternal Grandfather   . Colon polyps Other   . Hypertension Mother   . Prostate cancer Father     Current Outpatient Prescriptions  Medication Sig Dispense Refill  . acetaminophen (TYLENOL) 500 MG tablet Take 1,000 mg by mouth 2 (two) times daily as needed (pain).      Marland Kitchen ALPRAZolam (XANAX) 0.5 MG tablet Take 0.25 mg by mouth 3 (three) times daily as needed for anxiety.       Marland Kitchen amoxicillin-clavulanate (AUGMENTIN) 875-125 MG per tablet Take 1 tablet by mouth 2 (two) times daily.  28 tablet  2  . b complex vitamins capsule Take 1 capsule by mouth at bedtime.       . cetirizine (ZYRTEC) 10 MG tablet Take 5 mg by mouth at bedtime.       Marland Kitchen desvenlafaxine (PRISTIQ) 100 MG 24 hr tablet Take 200 mg by mouth daily.      Derrill Memo ON 07/15/2013] metroNIDAZOLE (FLAGYL) 500 MG tablet Take 2 tablets (1,000mg  total) by mouth as directed. Take 2 pills (1,000mg ) by mouth at 1pm, 3pm, and 10pm the day before your colorectal operation as discussed in [facility]. Call [phone] with questions.  6 tablet  0  . naproxen sodium (ALEVE) 220 MG tablet Take 440 mg by mouth daily as needed (pain).      Derrill Memo ON 07/15/2013] neomycin (MYCIFRADIN) 500 MG tablet Take 2 tablets (1,000mg  total) by mouth as directed. Take 2 pills (1,000mg ) by mouth at 1pm, 3pm, and 10pm the day before your colorectal operation as discussed in [facility]. Call [phone] with questions.  6 tablet  0  . Pancrelipase, Lip-Prot-Amyl, (CREON) 36000 UNITS CPEP Take 36,000 Units by mouth 3 (three) times daily.      . propranolol (INDERAL) 10 MG tablet Take 10 mg by mouth daily.      . propranolol (INDERAL) 10 MG tablet TAKE 1/2 TABLET BY MOUTH DAILY.  45 tablet  0  . sodium chloride 0.9 % injection Inject 10 mLs into the vein as needed.  100 mL  5  . zolpidem (AMBIEN) 10 MG tablet Take 5 mg by mouth at bedtime as needed for sleep.      Marland Kitchen aspirin EC 81 MG tablet Take 81 mg by mouth at bedtime.       . fluconazole (DIFLUCAN) 200 MG tablet Take 0.5 tablets (100 mg  total) by mouth daily.  3 tablet  3   No current facility-administered medications for this visit.     Allergies  Allergen Reactions  . Anesthetics, Amide Other (See Comments)    System shut down when 53 years old having jaw work done  . Succinylcholine Chloride Other (See Comments)    Major organs stopped working    BP 110/70  Pulse 74  Resp 18  Ht 5\' 5"  (1.651 m)  Wt 136 lb 3.2 oz (61.78 kg)  BMI 22.66 kg/m2  Ct Abdomen Pelvis W Contrast  05/20/2013   CLINICAL DATA:  History of left lower quadrant diverticular abscess with persistent drainage from the catheter. Intermittent left lower quadrant pain.  EXAM: CT ABDOMEN AND PELVIS WITH CONTRAST  TECHNIQUE: Multidetector CT imaging of the abdomen and pelvis was performed using the standard protocol following bolus administration of intravenous contrast.  CONTRAST:  26mL OMNIPAQUE IOHEXOL 300 MG/ML  SOLN  COMPARISON:  CT IMAGE GUIDED DRAINAGE BY PERCUTANEOUS CATHETER dated 04/26/2013; CT ABD/PELVIS W CM dated 04/25/2013  FINDINGS: Lung bases show scattered scarring or atelectasis. Heart size normal. No pericardial or pleural effusion.  Liver, gallbladder, adrenal glands, kidneys and spleen are unremarkable. Calcifications are seen in the pancreatic head and uncinate process. Pancreas, stomach, small bowel and proximal colon are otherwise unremarkable. Minimal residual soft tissue thickening adjacent to the descending colon, at the site of indwelling percutaneous drain (series 2, image 42), without measurable fluid collection. Slight thickening of the posterior wall of the descending duodenum in association. Remainder of the colon is unremarkable.  Uterus and ovaries are visualized. No pathologically enlarged lymph nodes. No free fluid. Atherosclerotic calcification of the arterial vasculature without abdominal aortic aneurysm. No worrisome lytic or sclerotic lesions. Degenerative changes are seen in the spine with minimal retrolisthesis of L5 on S1.   IMPRESSION: 1. Minimal residual soft tissue thickening and stranding in the left lower quadrant, adjacent to the descending duodenum, at the site of indwelling percutaneous drain. No measurable fluid collection. 2. Chronic calcific pancreatitis.   Electronically Signed   By: Lorin Picket M.D.   On: 05/20/2013 13:16   Ct Abdomen Pelvis W Contrast  04/25/2013   CLINICAL DATA:  Left lower quadrant abdominal pain 5 days  EXAM: CT ABDOMEN AND PELVIS WITH CONTRAST  TECHNIQUE: Multidetector CT imaging of the abdomen and pelvis was performed using the standard protocol following bolus administration of intravenous contrast.  CONTRAST:  44mL OMNIPAQUE IOHEXOL 300 MG/ML  SOLN  COMPARISON:  CT ABD/PELVIS W CM dated 07/30/2012  FINDINGS: Lung bases are clear.  No pericardial fluid.  No focal hepatic lesion. The gallbladder, pancreas, spleen, adrenal glands, kidneys demonstrate no acute findings. There is calcifications of the pancreatic head similar prior consistent with chronic pancreatitis.  The stomach, small bowel, and cecum normal. There is a fluid collection along the posterior wall of the descending colon measuring 3.7 x 3.5 cm. This has a thin enhancing rim and extends inferiorly along the pericolic gutter. This finding is most consistent with ruptured diverticulitis with abscess formation. The abscess extends to involve the left psoas muscle and iliacus muscle to a minimal degree (image 50 series 2). No evidence of bowel obstruction. No evidence of frank leak of the oral contrast. Contrast flows entirety of the colon to the rectum.  Abdominal or is normal caliber. No retroperitoneal periportal lymphadenopathy.  No free fluid the pelvis. Uterus and ovaries are normal. No pelvic lymphadenopathy. No aggressive osseous lesion.  IMPRESSION: 1. Abscess along the descending colon is most consistent with ruptured diverticulitis with abscess formation. 2. Abscess extends along the left pericolic gutter to involve the left  psoas and iliacus muscle. 3. No evidence of active leak of oral contrast. 4. No significant intraperitoneal free air. Findings conveyed toJenniffer Pipenbrink, [place] 04/25/2013  at21:55.   Electronically Signed   By: Suzy Bouchard M.D.   On: 04/25/2013 21:56   Ir Sinus/fist Tube Chk-non Gi  05/13/2013   INDICATION: History of left-sided perforated diverticular abscess, post CT-guided percutaneous drainage catheter  placement (04/26/2013).  EXAM: SINUS TRACT INJECTION/FISTULOGRAM  COMPARISON:  CT IMAGE GUIDED DRAINAGE BY PERCUTANEOUS CATHETER dated 04/26/2013; CT ABD/PELVIS W CM dated 04/25/2013  MEDICATIONS: None.  CONTRAST:  28mL OMNIPAQUE IOHEXOL 300 MG/ML  SOLN  FLUOROSCOPY TIME:  48 seconds.  COMPLICATIONS: None immediate  TECHNIQUE: A preprocedural spot fluoroscopic image was obtained of the existing left lower quadrant abscess drainage catheter.  Multiple spot fluoroscopic and radiographic images were obtained in various obliquities following the injection of a small amount of contrast via the existing percutaneous drainage catheter.  Images were reviewed and the procedure was terminated. The catheter was flushed with a small amount of saline and re-connected to a gravity bag. A dressing was placed. The patient tolerated the procedure well without immediate postprocedural complication.  FINDINGS: Preprocedural spot fluoroscopic image demonstrates grossly unchanged positioning of the abscess drainage catheter within the left lower abdominal quadrant.  Contrast injection demonstrates brisk opacification of the descending colon via potentially 2 discrete fistulous tracts with the adjacent distal descending colon.  IMPRESSION: Fluoroscopic guided drain injection demonstrates persistent communication with the end of the drainage catheter and the adjacent descending colon, potentially via 2 discrete fistulous tracts. As such, the drainage catheter was not removed.  Critical Value/emergent results were called by  telephone at the time of interpretation on 05/13/2013 at 1:34 PM to Dr. Donne Hazel, who verbally acknowledged these results.   Electronically Signed   By: Sandi Mariscal M.D.   On: 05/13/2013 13:36   Ct Image Guided Drainage By Percutaneous Catheter  04/26/2013   CLINICAL DATA:  Diverticular abscess in the left lower quadrant  EXAM: CT IMAGE GUIDED DRAINAGE BY PERCUTANEOUS CATHETER  FLUOROSCOPY TIME:  None  MEDICATIONS AND MEDICAL HISTORY: Versed four mg, Fentanyl 100 mcg.  Additional Medications: None.  ANESTHESIA/SEDATION: Moderate sedation time: 40 minutes  CONTRAST:  None.  PROCEDURE: The procedure, risks, benefits, and alternatives were explained to the patient. Questions regarding the procedure were encouraged and answered. The patient understands and consents to the procedure.  The left lower quadrant was prepped with Betadine in a sterile fashion, and a sterile drape was applied covering the operative field. A sterile gown and sterile gloves were used for the procedure.  1% lidocaine was utilized for local anesthesia. Under CT guidance, an 18 gauge needle was inserted into the left lower quadrant abscess posterior to the descending colon. It was removed over an Amplatz wire. A 12 French dilator followed by a 12 [place] drain were inserted. It was looped and string fixed and sewn to the skin. Frank pus was aspirated  FINDINGS: Images demonstrate 8 French drain placement into a diverticular abscess. 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  COMPLICATIONS: None  IMPRESSION: Successful left lower quadrant abscess drain.   Electronically Signed   By: Maryclare Bean M.D.   On: 04/26/2013 10:28     Review of Systems  Constitutional: Negative for fever, chills and diaphoresis.  HENT: Negative for ear pain, sore throat and trouble swallowing.   Eyes: Negative for photophobia and visual disturbance.  Respiratory: Negative for cough and choking.   Cardiovascular: Negative for  chest pain and palpitations.  Gastrointestinal: Positive for nausea and abdominal pain. Negative for vomiting, diarrhea, constipation, blood in stool, anal bleeding and rectal pain.  Endocrine: Negative for cold intolerance and heat intolerance.  Genitourinary: Negative for dysuria, frequency and difficulty urinating.  Musculoskeletal: Negative for gait problem and myalgias.  Skin: Negative for color change, pallor and rash.  Allergic/Immunologic: Negative for environmental allergies and food allergies.  Neurological: Negative for dizziness, speech difficulty, weakness and numbness.  Hematological: Negative for adenopathy.  Psychiatric/Behavioral: Negative for confusion and agitation. The patient is not nervous/anxious.        Objective:   Physical Exam  Constitutional: She is oriented to person, place, and time. She appears well-developed and well-nourished. No distress.  HENT:  Head: Normocephalic.  Mouth/Throat: Oropharynx is clear and moist. No oropharyngeal exudate.  Eyes: Conjunctivae and EOM are normal. Pupils are equal, round, and reactive to light. No scleral icterus.  Neck: Normal range of motion. Neck supple. No tracheal deviation present.  Cardiovascular: Normal rate, regular rhythm and intact distal pulses.   Pulmonary/Chest: Effort normal and breath sounds normal. No stridor. No respiratory distress. She exhibits no tenderness.  Abdominal: Soft. She exhibits no distension and no mass. There is no tenderness. No hernia. Hernia confirmed negative in the ventral area, confirmed negative in the right inguinal area and confirmed negative in the left inguinal area.    Genitourinary: No vaginal discharge found.  Musculoskeletal: Normal range of motion. She exhibits no tenderness.       Right elbow: She exhibits normal range of motion.       Left elbow: She exhibits normal range of motion.       Right wrist: She exhibits normal range of motion.       Left wrist: She exhibits  normal range of motion.       Right hand: Normal strength noted.       Left hand: Normal strength noted.  Lymphadenopathy:       Head (right side): No posterior auricular adenopathy present.       Head (left side): No posterior auricular adenopathy present.    She has no cervical adenopathy.    She has no axillary adenopathy.       Right: No inguinal adenopathy present.       Left: No inguinal adenopathy present.  Neurological: She is alert and oriented to person, place, and time. No cranial nerve deficit. She exhibits normal muscle tone. Coordination normal.  Skin: Skin is warm and dry. No rash noted. She is not diaphoretic. No erythema.  Psychiatric: She has a normal mood and affect. Her behavior is normal. Judgment and thought content normal.       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  If no more fistula and abscess, remove drain.  If the fistula persists, keep drain.  If still struggling, consider repeat CT scan of abdomen and pelvis to rule out no abscess.  Hopefully unlikely.  Patient would benefit from colonoscopy done just before surgery to rule out malignancy.  Dr. Carlean Purl planning on the day before surgery.    Plan minimally invasive approach with robotics vs. Laparoscopic approach and open backup.  We will plan splenic flexure mobilization since this seems to be at the descending/sigmoid junction.:  The anatomy & physiology of the digestive tract was discussed.  The pathophysiology of the colon was discussed.  Natural history risks without surgery was discussed.   I feel the risks of no intervention will lead to serious problems that outweigh the operative risks; therefore, I recommended a partial colectomy to remove the pathology.  Minimally invasive (Robotic/Laparoscopic) & open techniques were discussed.   Risks such as bleeding, infection, abscess, leak, reoperation,  possible  ostomy, hernia, heart attack, death, and other risks were discussed.  I noted a good likelihood this will help address the problem.   Goals of post-operative recovery were discussed as well.   Need for bowel regimen and healthy physical activity to optimize recovery noted as well. We will work to minimize complications.  Educational materials were given as well.  Questions were answered.  The patient expresses understanding & wishes to proceed with surgery.

## 2013-07-16 NOTE — Op Note (Signed)
07/16/2013  11:50 AM  PATIENT:  Tracey Fields  53 y.o. female  Patient Care Team: Biagio Borg, MD as PCP - General  PRE-OPERATIVE DIAGNOSIS:  recurrent sigmoid diverticulitis with colon fistula   POST-OPERATIVE DIAGNOSIS:  recurrent descending & sigmoid diverticulitis with colon fistula  PROCEDURE:  Procedure(s): ROBOT ASSISTED LAPAROSCOPIC LEFT COLECTOMY SPLENIC FLEXURE MOBILIZATION RIGID PROCTOSCOPY  SURGEON:  Surgeon(s): [name], MD  ASSISTANT: Leighton Ruff, MD   ANESTHESIA:   local and general  EBL:  Total I/O In: 1000 [I.V.:1000] Out: 34 [Urine:285; Blood:200]  Delay start of Pharmacological VTE agent (>24hrs) due to surgical blood loss or risk of bleeding:  no  DRAINS: none   SPECIMEN:  Source of Specimen:  Distal colon (descending & sigmoid colon)  DISPOSITION OF SPECIMEN:  PATHOLOGY  COUNTS:  YES  PLAN OF CARE: Admit to inpatient   PATIENT DISPOSITION:  PACU - hemodynamically stable.  INDICATION:    Patient with recurrent Left-sided diverticulitis.  Most recently at descending colon with abscess recurrent drain.  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  Has now been 6 weeks on antibiotics with drain.  Improved overall.  I recommended segmental resection:  The anatomy & physiology of the digestive tract was discussed.  The pathophysiology was discussed.  Natural history risks without surgery was discussed.   I worked to give an overview of the disease and the frequent need to have multispecialty involvement.  I feel the risks of no intervention will lead to serious problems that outweigh the operative risks; therefore, I recommended a partial colectomy to remove the pathology.  Laparoscopic & open techniques were discussed.   Risks such as bleeding, infection, abscess, leak, reoperation, possible ostomy, hernia, heart attack, death, and other risks were discussed.  I noted a good likelihood this will help address the problem.   Goals of post-operative  recovery were discussed as well.  We will work to minimize complications.  Educational materials on the pathology had been given in the office.  Questions were answered.    The patient expressed understanding & wished to proceed with surgery.  OR FINDINGS:   Patient had Abscess in mid descending colon with drain in place.  Minimal purulence now.  Also inflammation at the rectosigmoid junction concerning for prior sigmoid diverticulitis as well.  No obvious metastatic disease on visceral parietal peritoneum or liver.  The anastomosis rests 12 cm from the anal verge by rigid proctoscopy.  DESCRIPTION:   Informed consent was confirmed.  The patient underwent general anaesthesia without difficulty.  The patient was positioned appropriately.  VTE prevention in place.  The patient's abdomen was clipped, prepped, & draped in a sterile fashion.  Surgical timeout confirmed our plan.  The patient was positioned in reverse Trendelenburg.  I carefully placed a varies needle in the left subcostal region.  Induced insufflation to 15 mm Hg.  We placed a 10 mm port through this area.  Entry was clean.  I induced carbon dioxide insufflation.  Camera inspection revealed no injury.  Extra ports were carefully placed under direct laparoscopic visualization.  I reflected the greater omentum and the upper abdomen the small bowel in the upper abdomen.  The patient was carefully positioned.  The Intuitive daVinci robot was carefully docked with camera & instruments carefully placed.  The patient had Inflammation scarring on the descending colon.  I scored the base of peritoneum of the medial side of the mesentery of the left colon from the ligament of Treitz to the peritoneal reflection  of the mid rectum.   I elevated the sigmoid mesentery and entered into the retro-mesenteric plane. She had a very thinned out left colon mesentery. We were able to identify the left ureter and gonadal vessels.  We kept those posterior  within the retroperitoneum and elevated the left colon mesentery off that. I did isolated IMA pedicle but did not ligate it yet.  I continued distally and got into the avascular plane posterior to the mesorectum. This allowed me to help mobilize the rectum as well by freeing the mesorectum off the sacrum.  The nerve I urgently for somewhat attached to the mesorectum.  We carefully freed and offset to preserve them. I mobilized the peritoneal coverings towards the peritoneal reflection on both the right and left sides of the rectum.  I stayed away from the right and left ureters.  I kept the lateral vascular pedicles to the rectum intact.  I skeletonized the lymph nodes off the inferior mesenteric artery pedicle.  I went down to its takeoff 3cm proximal to the aorta.  I isolated the inferior mesenteric vein off of the ligament of Treitz just cephalad to that as well.  After confirming the left ureter was out of the way, I went ahead and ligated the inferior mesenteric artery pedicle just near its takeoff from the aorta.  I did ligate the inferior mesenteric vein in a similar fashion.  We ensured hemostasis. I skeletonized the mesorectum at the junction at the proximal rectum for the distal point of resection.    I mobilized the left colon in a lateral to medial fashion off the line of Toldt up towards the splenic flexure to ensure good mobilization of the remaining left colon to reach into the pelvis.  Encountered the dense inflammation to the left retroperitoneum at the mid/distal descending colon consistent with the area diverticulitis.  We sharply creating out into an abscess cavity in the retroperitoneum.  We removed the pigtail drain.  No major purulence.  Carefully freed that off.  An upgoing a little bit into the renal fat pad/ Gerota's fascia to have good margins.  We end up having to mobilize the splenic flexure the colon.  We alternated between a lateral to medial and superior to inferior fashion.   Freed the greater omentum off the mid transverse colon and follow that more distally.  Rather dense omental adhesions to the splenic flexure but carefully freed off.  Then we were able to free the transverse meso off the retroperitoneum and pancreas to get much better mobilization.  I placed a wound protector through a 6cm Pfannenstiel incision in the suprapubic region, taking care to avoid bladder injury. I transected bowel at the proximal rectum using a contour stapler. We took the mesorectum using cautery and clamps and ties to assure hemostasis  I was able to eviscerate the rectosigmoid and left colon out the wound.  I chose a region at the distal transverse colon that was soft and easily reached down. We ligated the left colon mesentery to this point, staying close to the colon to preserve left-sided colon mesentery.  I clamped the colon proximal to this area using a soft bowel clamp. I transected at the distal transverse colon with a scalpel. I got healthy bleeding mucosa. We sent the distal colon specimen off to go to pathology.  We sized the colon orifice.  I chose a 33 EEA anvil stapler system. I placed the anvil to the open end of the proximal remaining colon and closed  around it using a 0 Prolene pursestring.  We did copious irrigation with crystalloid solution.  Hemostasis was good.  The distal end of the remaining colon easily reached down to the rectal stump.  Dr Marcello Moores scrubbed down and did gentle anal dilation and advanced the EEA stapler up the rectal stump. The spike was brought out at the provimal end of the rectal stump under direct visualization.  I attached the anvil of the proximal colon the spike of the stapler. Anvil was tightened down and held clamped for 60 seconds. The EEA stapler was fired and held clamped for 30 seconds. The stapler was released & removed. We noted 2 excellent anastomotic rings. Blue stitch is in the proximal ring.  Dr Marcello Moores did rigid proctoscopy noted the  anastomosis was at 12 cm from the anal verge consistent with the proximal rectum.  We did a final irrigation of antibiotic solution (900 mg clindamycin/240 mg gentamicin in a liter of crystalloid) & held that for the pelvic air leak test .  The rectum was insufflated the rectum while clamping the colon proximal to that anastomosis.  There was a negative air leak test. There was no tension of mesentery or bowel at the anastomosis.   Tissues looked viable.  Endoluminal gas was evacuated.  We changed gloves.  We aspirated the antibiotic irrigation.  Hemostasis was good.   Ureters & bowel uninjured.  The anastomosis looked healthy.   We changed gown and gloves.  The patient was re-draped.  Sterile unused instruments were used from this point out per colon SSI prevention protocol.   I placed On-Q catheter and sheaths into the preperitoneal space under direct palpation.  I removed CO2 gas out through the ports.  I closed the 27mm port sites using Monocryl stitch and sterile dressing.  I closed the Pfannenstiel wound using a 0 Vicryl vertical peritoneal closure and a #1 PDS transverse anterior rectal fascial closure. I closed the skin with some interrupted Monocryl stitches. I placed antibiotic-soaked wicks into the closure at the corners & centrally x4 between those areas. I placed a sterile dressing.  OnQ catheters placed & sheaths peeled away.  Patient is being extubated go to recovery room. I discussed postop care with the patient in detail the office & in the holding area. Instructions are written. There is no family physically in the hospital.  I will try to reach them by phone and discuss intraoperative findings soon.

## 2013-07-16 NOTE — Anesthesia Postprocedure Evaluation (Signed)
Anesthesia Post Note  Patient: Tracey Fields  Procedure(s) Performed: Procedure(s) (LRB): ROBOT ASSISTED LAPAROSCOPIC LEFT COLECTOMY SPLENIC FLEXURE IMMOBILIZATION WITH RIGID PROCTOSCOPY (N/A)  Anesthesia type: General  Patient location: PACU  Post pain: Pain level controlled  Post assessment: Post-op Vital signs reviewed  Last Vitals:  Filed Vitals:   07/16/13 1415  BP: 105/68  Pulse: 98  Temp: 36.8 C  Resp: 18    Post vital signs: Reviewed  Level of consciousness: sedated  Complications: No apparent anesthesia complications

## 2013-07-16 NOTE — Telephone Encounter (Signed)
No answer, message left for the patient. 

## 2013-07-16 NOTE — Interval H&P Note (Signed)
History and Physical Interval Note:  07/16/2013 6:42 AM  Tracey Fields  has presented today for surgery, with the diagnosis of recurrent sigmoid diverticulitis with colon fistula   The various methods of treatment have been discussed with the patient and family. After consideration of risks, benefits and other options for treatment, the patient has consented to  Procedure(s): ROBOT ASSISTED LAPAROSCOPIC PARTIAL COLECTOMY (N/A) as a surgical intervention .  The patient's history has been reviewed, patient examined, no change in status, stable for surgery.  I have reviewed the patient's chart and labs.  Questions were answered to the patient's satisfaction.     [name] C.

## 2013-07-16 NOTE — Progress Notes (Signed)
Spoke with Dr. Johney Maine via phone re pt's pain med.  Dr. Johney Maine stated pt should not be having any po meds.  Explained that routine PO meds were ordered for today.  He states not to give po meds.  He did not d/c meds but I held them and will report to dayshift in am.  Pt did however already receive po meds that were ordered for prior shift.  Pt has no complaints of nausea and tolerated clear liq tonight.

## 2013-07-16 NOTE — Discharge Instructions (Signed)
ABDOMINAL SURGERY: POST OP INSTRUCTIONS ° °1. DIET: Follow a light bland diet the first 24 hours after arrival home, such as soup, liquids, crackers, etc.  Be sure to include lots of fluids daily.  Avoid fast food or heavy meals as your are more likely to get nauseated.  Eat a low fat the next few days after surgery.   °2. Take your usually prescribed home medications unless otherwise directed. °3. PAIN CONTROL: °a. Pain is best controlled by a usual combination of three different methods TOGETHER: °i. Ice/Heat °ii. Over the counter pain medication °iii. Prescription pain medication °b. Most patients will experience some swelling and bruising around the incisions.  Ice packs or heating pads (30-60 minutes up to 6 times a day) will help. Use ice for the first few days to help decrease swelling and bruising, then switch to heat to help relax tight/sore spots and speed recovery.  Some people prefer to use ice alone, heat alone, alternating between ice & heat.  Experiment to what works for you.  Swelling and bruising can take several weeks to resolve.   °c. It is helpful to take an over-the-counter pain medication regularly for the first few weeks.  Choose one of the following that works best for you: °i. Naproxen (Aleve, etc)  Two 220mg tabs twice a day °ii. Ibuprofen (Advil, etc) Three 200mg tabs four times a day (every meal & bedtime) °iii. Acetaminophen (Tylenol, etc) 500-650mg four times a day (every meal & bedtime) °d. A  prescription for pain medication (such as oxycodone, hydrocodone, etc) should be given to you upon discharge.  Take your pain medication as prescribed.  °i. If you are having problems/concerns with the prescription medicine (does not control pain, nausea, vomiting, rash, itching, etc), please call us [phone] to see if we need to switch you to a different pain medicine that will work better for you and/or control your side effect better. °ii. If you need a refill on your pain medication,  please contact your pharmacy.  They will contact our office to request authorization. Prescriptions will not be filled after 5 pm or on week-ends. °4. Avoid getting constipated.  Between the surgery and the pain medications, it is common to experience some constipation.  Increasing fluid intake and taking a fiber supplement (such as Metamucil, Citrucel, FiberCon, MiraLax, etc) 1-2 times a day regularly will usually help prevent this problem from occurring.  A mild laxative (prune juice, Milk of Magnesia, MiraLax, etc) should be taken according to package directions if there are no bowel movements after 48 hours.   °5. Watch out for diarrhea.  If you have many loose bowel movements, simplify your diet to bland foods & liquids for a few days.  Stop any stool softeners and decrease your fiber supplement.  Switching to mild anti-diarrheal medications (Kayopectate, Pepto Bismol) can help.  If this worsens or does not improve, please call us. °6. Wash / shower every day.  You may shower over the incision / wound.  Avoid baths until the skin is fully healed.  Continue to shower over incision(s) after the dressing is off. °7. Remove your waterproof bandages 5 days after surgery.  You may leave the incision open to air.  You may replace a dressing/Band-Aid to cover the incision for comfort if you wish. °8. ACTIVITIES as tolerated:   °a. You may resume regular (light) daily activities beginning the next day--such as daily self-care, walking, climbing stairs--gradually increasing activities as tolerated.  If you can   walk 30 minutes without difficulty, it is safe to try more intense activity such as jogging, treadmill, bicycling, low-impact aerobics, swimming, etc. b. Save the most intensive and strenuous activity for last such as sit-ups, heavy lifting, contact sports, etc  Refrain from any heavy lifting or straining until you are off narcotics for pain control.   c. DO NOT PUSH THROUGH PAIN.  Let pain be your guide: If it  hurts to do something, don't do it.  Pain is your body warning you to avoid that activity for another week until the pain goes down. d. You may drive when you are no longer taking prescription pain medication, you can comfortably wear a seatbelt, and you can safely maneuver your car and apply brakes. e. Dennis Bast may have sexual intercourse when it is comfortable.  9. FOLLOW UP in our office a. Please call [facility] at [phone] to set up an appointment to see your surgeon in the office for a follow-up appointment approximately 1-2 weeks after your surgery. b. Make sure that you call for this appointment the day you arrive home to insure a convenient appointment time. 10. IF YOU HAVE DISABILITY OR FAMILY LEAVE FORMS, BRING THEM TO THE OFFICE FOR PROCESSING.  DO NOT GIVE THEM TO YOUR DOCTOR.   WHEN TO CALL us [phone]: 1. Poor pain control 2. Reactions / problems with new medications (rash/itching, nausea, etc)  3. Fever over 101.5 F (38.5 C) 4. Inability to urinate 5. Nausea and/or vomiting 6. Worsening swelling or bruising 7. Continued bleeding from incision. 8. Increased pain, redness, or drainage from the incision  The clinic staff is available to answer your questions during regular business hours (8:30am-5pm).  Please dont hesitate to call and ask to speak to one of our nurses for clinical concerns.   A surgeon from [facility] is always on call at the hospitals   If you have a medical emergency, go to the nearest emergency room or call 911.    [facility], [place], [place], [place],   [address] ? MAIN: [phone] ? TOLL FREE: [phone] ? FAX [phone] [email or web address]  Diverticulitis A diverticulum is a small pouch or sac on the colon. Diverticulosis is the presence of these diverticula on the colon. Diverticulitis is the irritation (inflammation) or infection of diverticula. CAUSES  The colon and its  diverticula contain bacteria. If food particles block the tiny opening to a diverticulum, the bacteria inside can grow and cause an increase in pressure. This leads to infection and inflammation and is called diverticulitis. SYMPTOMS   Abdominal pain and tenderness. Usually, the pain is located on the left side of your abdomen. However, it could be located elsewhere.  Fever.  Bloating.  Feeling sick to your stomach (nausea).  Throwing up (vomiting).  Abnormal stools. DIAGNOSIS  Your caregiver will take a history and perform a physical exam. Since many things can cause abdominal pain, other tests may be necessary. Tests may include:  Blood tests.  Urine tests.  X-ray of the abdomen.  CT scan of the abdomen. Sometimes, surgery is needed to determine if diverticulitis or other conditions are causing your symptoms. TREATMENT  Most of the time, you can be treated without surgery. Treatment includes:  Resting the bowels by only having liquids for a few days. As you improve, you will need to eat a low-fiber diet.  Intravenous (IV) fluids if you are losing body fluids (dehydrated).  Antibiotic medicines that treat infections  may be given.  Pain and nausea medicine, if needed.  Surgery if the inflamed diverticulum has burst. HOME CARE INSTRUCTIONS   Try a clear liquid diet (broth, tea, or water for as long as directed by your caregiver). You may then gradually begin a low-fiber diet as tolerated.  A low-fiber diet is a diet with less than 10 grams of fiber. Choose the foods below to reduce fiber in the diet:  White breads, cereals, rice, and pasta.  Cooked fruits and vegetables or soft fresh fruits and vegetables without the skin.  Ground or well-cooked tender beef, ham, veal, lamb, pork, or poultry.  Eggs and seafood.  After your diverticulitis symptoms have improved, your caregiver may put you on a high-fiber diet. A high-fiber diet includes 14 grams of fiber for every 1000  calories consumed. For a standard 2000 calorie diet, you would need 28 grams of fiber. Follow these diet guidelines to help you increase the fiber in your diet. It is important to slowly increase the amount fiber in your diet to avoid gas, constipation, and bloating.  Choose whole-grain breads, cereals, pasta, and brown rice.  Choose fresh fruits and vegetables with the skin on. Do not overcook vegetables because the more vegetables are cooked, the more fiber is lost.  Choose more nuts, seeds, legumes, dried peas, beans, and lentils.  Look for food products that have greater than 3 grams of fiber per serving on the Nutrition Facts label.  Take all medicine as directed by your caregiver.  If your caregiver has given you a follow-up appointment, it is very important that you go. Not going could result in lasting (chronic) or permanent injury, pain, and disability. If there is any problem keeping the appointment, call to reschedule. SEEK MEDICAL CARE IF:   Your pain does not improve.  You have a hard time advancing your diet beyond clear liquids.  Your bowel movements do not return to normal. SEEK IMMEDIATE MEDICAL CARE IF:   Your pain becomes worse.  You have an oral temperature above 102 F (38.9 C), not controlled by medicine.  You have repeated vomiting.  You have bloody or black, tarry stools.  Symptoms that brought you to your caregiver become worse or are not getting better. MAKE SURE YOU:   Understand these instructions.  Will watch your condition.  Will get help right away if you are not doing well or get worse. Document Released: 11/01/2004 Document Revised: 04/16/2011 Document Reviewed: 02/27/2010 [facility] Patient Information 2014 [place].  Managing Pain  Pain after surgery or related to activity is often due to strain/injury to muscle, tendon, nerves and/or incisions.  This pain is usually short-term and will improve in a few months.   Many people find  it helpful to do the following things TOGETHER to help speed the process of healing and to get back to regular activity more quickly:  1. Avoid heavy physical activity a.  no lifting greater than 20 pounds b. Do not push through the pain.  Listen to your body and avoid positions and maneuvers than reproduce the pain c. Walking is okay as tolerated, but go slowly and stop when getting sore.  d. Remember: If it hurts to do it, then dont do it! 2. Take Anti-inflammatory medication  a. Take with food/snack around the clock for 1-2 weeks i. This helps the muscle and nerve tissues become less irritable and calm down faster b. Choose ONE of the following over-the-counter medications: i. Naproxen 220mg  tabs (ex. Aleve)  1-2 pills twice a day  ii. Ibuprofen 200mg  tabs (ex. Advil, Motrin) 3-4 pills with every meal and just before bedtime iii. Acetaminophen 500mg  tabs (Tylenol) 1-2 pills with every meal and just before bedtime 3. Use a Heating pad or Ice/Cold Pack a. 4-6 times a day b. May use warm bath/hottub  or showers 4. Try Gentle Massage and/or Stretching  a. at the area of pain many times a day b. stop if you feel pain - do not overdo it  Try these steps together to help you body heal faster and avoid making things get worse.  Doing just one of these things may not be enough.    If you are not getting better after two weeks or are noticing you are getting worse, contact our office for further advice; we may need to re-evaluate you & see what other things we can do to help.   GETTING TO GOOD BOWEL HEALTH. Irregular bowel habits such as constipation and diarrhea can lead to many problems over time.  Having one soft bowel movement a day is the most important way to prevent further problems.  The anorectal canal is designed to handle stretching and feces to safely manage our ability to get rid of solid waste (feces, poop, stool) out of our body.  BUT, hard constipated stools can act like ripping  concrete bricks and diarrhea can be a burning fire to this very sensitive area of our body, causing inflamed hemorrhoids, anal fissures, increasing risk is perirectal abscesses, abdominal pain/bloating, an making irritable bowel worse.     The goal: ONE SOFT BOWEL MOVEMENT A DAY!  To have soft, regular bowel movements:    Drink at least 8 tall glasses of water a day.     Take plenty of fiber.  Fiber is the undigested part of plant food that passes into the colon, acting s natures broom to encourage bowel motility and movement.  Fiber can absorb and hold large amounts of water. This results in a larger, bulkier stool, which is soft and easier to pass. Work gradually over several weeks up to 6 servings a day of fiber (25g a day even more if needed) in the form of: o Vegetables -- Root (potatoes, carrots, turnips), leafy green (lettuce, salad greens, celery, spinach), or cooked high residue (cabbage, broccoli, etc) o Fruit -- Fresh (unpeeled skin & pulp), Dried (prunes, apricots, cherries, etc ),  or stewed ( applesauce)  o Whole grain breads, pasta, etc (whole wheat)  o Bran cereals    Bulking Agents -- This type of water-retaining fiber generally is easily obtained each day by one of the following:  o Psyllium bran -- The psyllium plant is remarkable because its ground seeds can retain so much water. This product is available as Metamucil, Konsyl, Effersyllium, Per Diem Fiber, or the less expensive generic preparation in drug and health food stores. Although labeled a laxative, it really is not a laxative.  o Methylcellulose -- This is another fiber derived from wood which also retains water. It is available as Citrucel. o Polyethylene Glycol - and artificial fiber commonly called Miralax or Glycolax.  It is helpful for people with gassy or bloated feelings with regular fiber o Flax Seed - a less gassy fiber than psyllium   No reading or other relaxing activity while on the toilet. If bowel movements  take longer than 5 minutes, you are too constipated   AVOID CONSTIPATION.  High fiber and water intake usually takes care of  this.  Sometimes a laxative is needed to stimulate more frequent bowel movements, but    Laxatives are not a good long-term solution as it can wear the colon out. o Osmotics (Milk of Magnesia, Fleets phosphosoda, Magnesium citrate, MiraLax, GoLytely) are safer than  o Stimulants (Senokot, Castor Oil, Dulcolax, Ex Lax)    o Do not take laxatives for more than 7days in a row.    IF SEVERELY CONSTIPATED, try a Bowel Retraining Program: o Do not use laxatives.  o Eat a diet high in roughage, such as bran cereals and leafy vegetables.  o Drink six (6) ounces of prune or apricot juice each morning.  o Eat two (2) large servings of stewed fruit each day.  o Take one (1) heaping tablespoon of a psyllium-based bulking agent twice a day. Use sugar-free sweetener when possible to avoid excessive calories.  o Eat a normal breakfast.  o Set aside 15 minutes after breakfast to sit on the toilet, but do not strain to have a bowel movement.  o If you do not have a bowel movement by the third day, use an enema and repeat the above steps.    Controlling diarrhea o Switch to liquids and simpler foods for a few days to avoid stressing your intestines further. o Avoid dairy products (especially milk & ice cream) for a short time.  The intestines often can lose the ability to digest lactose when stressed. o Avoid foods that cause gassiness or bloating.  Typical foods include beans and other legumes, cabbage, broccoli, and dairy foods.  Every person has some sensitivity to other foods, so listen to our body and avoid those foods that trigger problems for you. o Adding fiber (Citrucel, Metamucil, psyllium, Miralax) gradually can help thicken stools by absorbing excess fluid and retrain the intestines to act more normally.  Slowly increase the dose over a few weeks.  Too much fiber too soon can  backfire and cause cramping & bloating. o Probiotics (such as active yogurt, Align, etc) may help repopulate the intestines and colon with normal bacteria and calm down a sensitive digestive tract.  Most studies show it to be of mild help, though, and such products can be costly. o Medicines:   Bismuth subsalicylate (ex. Kayopectate, Pepto Bismol) every 30 minutes for up to 6 doses can help control diarrhea.  Avoid if pregnant.   Loperamide (Immodium) can slow down diarrhea.  Start with two tablets (4mg  total) first and then try one tablet every 6 hours.  Avoid if you are having fevers or severe pain.  If you are not better or start feeling worse, stop all medicines and call your doctor for advice o Call your doctor if you are getting worse or not better.  Sometimes further testing (cultures, endoscopy, X-ray studies, bloodwork, etc) may be needed to help diagnose and treat the cause of the diarrhea. o

## 2013-07-17 DIAGNOSIS — F3289 Other specified depressive episodes: Secondary | ICD-10-CM

## 2013-07-17 DIAGNOSIS — F329 Major depressive disorder, single episode, unspecified: Secondary | ICD-10-CM

## 2013-07-17 DIAGNOSIS — F411 Generalized anxiety disorder: Secondary | ICD-10-CM

## 2013-07-17 LAB — BASIC METABOLIC PANEL
BUN: 5 mg/dL — ABNORMAL LOW (ref 6–23)
CALCIUM: 7.9 mg/dL — AB (ref 8.4–10.5)
CO2: 24 mEq/L (ref 19–32)
Chloride: 104 mEq/L (ref 96–112)
Creatinine, Ser: 0.74 mg/dL (ref 0.50–1.10)
GFR calc Af Amer: >90 mL/min (ref >90)
GLUCOSE: 144 mg/dL — AB (ref 70–99)
Potassium: 3.4 mEq/L — ABNORMAL LOW (ref 3.7–5.3)
SODIUM: 140 meq/L (ref 137–147)

## 2013-07-17 LAB — CBC
HCT: 29.1 % — ABNORMAL LOW (ref 36.0–46.0)
Hemoglobin: 9.9 g/dL — ABNORMAL LOW (ref 12.0–15.0)
MCH: 29.7 pg (ref 26.0–34.0)
MCHC: 34 g/dL (ref 30.0–36.0)
MCV: 87.4 fL (ref 78.0–100.0)
PLATELETS: 236 10*3/uL (ref 150–400)
RBC: 3.33 MIL/uL — AB (ref 3.87–5.11)
RDW: 13.9 % (ref 11.5–15.5)
WBC: 8.4 10*3/uL (ref 4.0–10.5)

## 2013-07-17 LAB — MAGNESIUM: MAGNESIUM: 1.6 mg/dL (ref 1.5–2.5)

## 2013-07-17 MED ORDER — KETOROLAC TROMETHAMINE 15 MG/ML IJ SOLN
INTRAMUSCULAR | Status: AC
  Filled 2013-07-17: qty 1

## 2013-07-17 MED ORDER — MORPHINE SULFATE (PF) 1 MG/ML IV SOLN: INTRAVENOUS | Status: DC

## 2013-07-17 MED ORDER — ONDANSETRON HCL 4 MG/2ML IJ SOLN: 4.0000 mg | Freq: Four times a day (QID) | INTRAMUSCULAR | Status: DC | PRN

## 2013-07-17 MED ORDER — DESVENLAFAXINE SUCCINATE ER 50 MG PO TB24
200.0000 mg | ORAL_TABLET | Freq: Every day | ORAL | Status: DC
  Administered 2013-07-17 – 2013-07-21 (×5): 200 mg via ORAL
  Filled 2013-07-17: qty 4
  Filled 2013-07-17: qty 2
  Filled 2013-07-17 (×4): qty 4

## 2013-07-17 MED ORDER — NALOXONE HCL 0.4 MG/ML IJ SOLN: 0.4000 mg | INTRAMUSCULAR | Status: DC | PRN

## 2013-07-17 MED ORDER — HYDROMORPHONE 0.3 MG/ML IV SOLN: INTRAVENOUS | Status: DC

## 2013-07-17 MED ORDER — ACETAMINOPHEN 325 MG PO TABS: 325.0000 mg | ORAL_TABLET | Freq: Four times a day (QID) | ORAL | Status: DC | PRN

## 2013-07-17 MED ORDER — MORPHINE SULFATE 4 MG/ML IJ SOLN
4.0000 mg | Freq: Once | INTRAMUSCULAR | Status: AC
  Administered 2013-07-17: 4 mg via INTRAVENOUS
  Filled 2013-07-17: qty 1

## 2013-07-17 MED ORDER — SODIUM CHLORIDE 0.9 % IJ SOLN: 9.0000 mL | INTRAMUSCULAR | Status: DC | PRN

## 2013-07-17 MED ORDER — ALPRAZOLAM 0.25 MG PO TABS
0.2500 mg | ORAL_TABLET | Freq: Once | ORAL | Status: AC
  Administered 2013-07-17: 0.25 mg via ORAL
  Filled 2013-07-17: qty 1

## 2013-07-17 MED ORDER — MORPHINE SULFATE (PF) 1 MG/ML IV SOLN
INTRAVENOUS | Status: DC
  Administered 2013-07-17: 10 mg via INTRAVENOUS
  Administered 2013-07-17: 07:00:00 via INTRAVENOUS
  Administered 2013-07-17: 4 mg via INTRAVENOUS
  Administered 2013-07-17: 18:00:00 via INTRAVENOUS
  Administered 2013-07-17 – 2013-07-18 (×2): 10 mg via INTRAVENOUS
  Administered 2013-07-18: 6 mg via INTRAVENOUS
  Administered 2013-07-18: 2 mg via INTRAVENOUS
  Administered 2013-07-18: 10 mg via INTRAVENOUS
  Administered 2013-07-18: 12 mg via INTRAVENOUS
  Administered 2013-07-18 (×2): via INTRAVENOUS
  Administered 2013-07-19: 8 mg via INTRAVENOUS
  Administered 2013-07-19: 11 mg via INTRAVENOUS
  Administered 2013-07-19: 6 mg via INTRAVENOUS
  Administered 2013-07-19: 8 mg via INTRAVENOUS
  Administered 2013-07-19: 6 mg via INTRAVENOUS
  Administered 2013-07-19: 8 mg via INTRAVENOUS
  Administered 2013-07-19: 16:00:00 via INTRAVENOUS
  Administered 2013-07-20: 8 mg via INTRAVENOUS
  Administered 2013-07-20: 01:00:00 via INTRAVENOUS
  Administered 2013-07-20: 8 mg via INTRAVENOUS
  Filled 2013-07-17 (×7): qty 25

## 2013-07-17 MED ORDER — KETOROLAC TROMETHAMINE 15 MG/ML IJ SOLN
15.0000 mg | Freq: Four times a day (QID) | INTRAMUSCULAR | Status: AC
  Administered 2013-07-17 – 2013-07-19 (×12): 15 mg via INTRAVENOUS
  Filled 2013-07-17 (×9): qty 1

## 2013-07-17 MED ORDER — METHOCARBAMOL 1000 MG/10ML IJ SOLN
500.0000 mg | Freq: Four times a day (QID) | INTRAVENOUS | Status: AC | PRN
  Administered 2013-07-17: 500 mg via INTRAVENOUS
  Filled 2013-07-17: qty 5

## 2013-07-17 MED ORDER — MORPHINE SULFATE 2 MG/ML IJ SOLN
2.0000 mg | INTRAMUSCULAR | Status: DC | PRN
  Filled 2013-07-17: qty 2

## 2013-07-17 MED ORDER — LORAZEPAM 2 MG/ML IJ SOLN
0.5000 mg | Freq: Three times a day (TID) | INTRAMUSCULAR | Status: DC | PRN
  Administered 2013-07-17 – 2013-07-18 (×3): 1 mg via INTRAVENOUS
  Filled 2013-07-17 (×3): qty 1

## 2013-07-17 NOTE — Progress Notes (Signed)
[place], MD, [address] [place], [place] [address] Phone: [phone] FAX: [phone]    Tracey Fields [id number] 06-19-1960  CARE TEAM:  PCP: Cathlean Cower, MD  Outpatient Care Team: Patient Care Team: Biagio Borg, MD as PCP - General  Inpatient Treatment Team: Treatment Team: Attending Provider: [name], MD; Technician: Leda Quail, NT; Registered Nurse: Hoyle Barr, RN   Subjective:  Pt wanting PO narcotics.  Tried to explain to RN poor idea POD#0 s/p colectomy.  Confusion & all PO meds stopped Dilaudid not working well.  Switched to fentanyl w little help Pt walked in hallways x2 & up in chair Crying/scared.  Anxious Hurts to cough Feeling some discomfort on her chest and neck.  Not with activity.  Objective:  Vital signs:  Filed Vitals:   07/16/13 1800 07/16/13 2209 07/16/13 2329 07/17/13 0200  BP: 104/64 103/63 128/82 118/70  Pulse: 80 80 109 79  Temp: 98.6 F (37 C) 99 F (37.2 C) 98.9 F (37.2 C) 99.1 F (37.3 C)  TempSrc: Oral Oral  Oral  Resp: _0 Height:      Weight:      SpO2: 100% 93% 94% 95%       Intake/Output   Yesterday:  06/11 0701 - 06/12 0700 In: 2862.5 [P.O.:90; I.V.:2672.5; IV Piggyback:100] Out: 4132 [Urine:1310; Blood:200] This shift:  Total I/O In: 740 [P.O.:90; I.V.:600; IV Piggyback:50] Out: 425 [Urine:425]  Bowel function:  Flatus: n  BM: n  Drain: n/a  Physical Exam:  General: Pt awake/alert/oriented x4 in mod acute distress Eyes: PERRL, normal EOM.  Sclera clear.  No icterus Neuro: CN II-XII intact w/o focal sensory/motor deficits. Lymph: No head/neck/groin lymphadenopathy Psych:  No delerium/psychosis/paranoia.  Tearful but consolable HENT: Normocephalic, Mucus membranes moist.  No thrush Neck: Supple, No tracheal deviation.  No swelling Chest: No chest wall pain w fair excursion.  Lungs mostly clear.   Some productive cough CV:  Pulses intact.  Regular rhythm MS: Normal AROM mjr joints.  No obvious deformity Abdomen: Soft.  Nondistended.  Dressings c/d/i.  Mod tender at incisions only.  No evidence of peritonitis.  No incarcerated hernias. Ext:  SCDs BLE.  No mjr edema.  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 female  1 Day Post-Op  Procedure(s): ROBOT ASSISTED LAPAROSCOPIC LEFT COLECTOMY SPLENIC FLEXURE IMMOBILIZATION WITH RIGID PROCTOSCOPY  Poor anxiety & pain control  Plan:  -I again discussed with the nurse that I did not want her to stop all oral medications.  It is felt it is not appropriate to start oral narcotics postop day zero from a colon resection.  Get her back on her usual anti-depression and anxiety meds.  -try morphine w PCA -add toradol.  Cont ice/heat.  Robaxin prn -anxiolysis - get back on TID Xanax.  Lorazepam prn if poor PO tolerance -f/u path -VTE prophylaxis- SCDs, etc -mobilize as tolerated to help recovery -abstain from smoking  I updated the patient's status to the patient.  Recommendations were made.  I noted that the fact that she is walking in hallways and in  the chair is a good sign of her motivation to get better.  I think her biggest issue is anxiety.  Hopefully will not get better control she will feel better.Questions were answered.  The patient expressed understanding & appreciation.   [name], M.D., F.A.C.S. Gastrointestinal and Minimally Invasive Surgery [facility] [address], [place], [place] [address] [phone] Main / Paging   07/17/2013   Results:   Labs: Results for orders placed  during the hospital encounter of 07/16/13 (from the past 48 hour(s))  TYPE AND SCREEN     Status: None   Collection Time    07/16/13  6:30 AM      Result Value Ref Range   ABO/RH(D) A POS     Antibody Screen NEG     Sample Expiration 07/19/2013    ABO/RH     Status: None   Collection Time    07/16/13  7:00 AM      Result Value Ref Range   ABO/RH(D) A POS    BASIC METABOLIC PANEL     Status: Abnormal   Collection Time    07/17/13  4:35 AM      Result Value Ref Range   Sodium 140  137 - 147 mEq/L   Potassium 3.4 (*) 3.7 - 5.3 mEq/L   Chloride 104  96 - 112 mEq/L   CO2 24  19 - 32 mEq/L   Glucose, Bld 144 (*) 70 - 99 mg/dL   BUN 5 (*) 6 - 23 mg/dL   Creatinine, Ser 0.74  0.50 - 1.10 mg/dL   Calcium 7.9 (*) 8.4 - 10.5 mg/dL   GFR calc non Af Amer >90  >90 mL/min   GFR calc Af Amer >90  >90 mL/min   Comment: (NOTE)     The eGFR has been calculated using the CKD EPI equation.     This calculation has not been validated in all clinical situations.     eGFR's persistently <90 mL/min signify possible Chronic Kidney     Disease.  CBC     Status: Abnormal   Collection Time    07/17/13  4:35 AM      Result Value Ref Range   WBC 8.4  4.0 - 10.5 K/uL   RBC 3.33 (*) 3.87 - 5.11 MIL/uL   Hemoglobin 9.9 (*) 12.0 - 15.0 g/dL   HCT 29.1 (*) 36.0 - 46.0 %   MCV 87.4  78.0 - 100.0 fL   MCH 29.7  26.0 - 34.0 pg   MCHC 34.0  30.0 - 36.0 g/dL   RDW 13.9  11.5 - 15.5 %   Platelets 236  150 - 400 K/uL  MAGNESIUM     Status: None   Collection Time    07/17/13  4:35 AM      Result Value Ref Range   Magnesium 1.6  1.5 - 2.5 mg/dL    Imaging / Studies: No results found.  Medications / Allergies: per chart  Antibiotics: Anti-infectives   Start     Dose/Rate Route Frequency Ordered Stop   07/16/13 2000  cefoTEtan (CEFOTAN) 2 g in dextrose 5 % 50 mL IVPB     2 g 100 mL/hr over 30 Minutes Intravenous Every 12 hours 07/16/13 1319 07/16/13 2047   07/16/13 1030  clindamycin (CLEOCIN) 900  mg, gentamicin (GARAMYCIN) 240 mg in sodium chloride 0.9 % 1,000 mL for intraperitoneal lavage      Intraperitoneal To Surgery 07/16/13 1002  07/16/13 1051   07/16/13 0619  cefoTEtan (CEFOTAN) 2 g in dextrose 5 % 50 mL IVPB     2 g 100 mL/hr over 30 Minutes Intravenous On call to O.R. 07/16/13 7282 07/16/13 0742       Note: This dictation was prepared with Dragon/digital dictation along with Smartphrase technology. Any transcriptional errors that result from this process are unintentional.

## 2013-07-18 ENCOUNTER — Encounter (HOSPITAL_COMMUNITY): Payer: Self-pay | Admitting: *Deleted

## 2013-07-18 MED ORDER — KETOROLAC TROMETHAMINE 15 MG/ML IJ SOLN
INTRAMUSCULAR | Status: AC
  Filled 2013-07-18: qty 1

## 2013-07-18 NOTE — Progress Notes (Signed)
Patient ID: Tracey Fields, female   DOB: October 31, 1960, 53 y.o.   MRN: [id number]  [facility] - Progress Note  POD# 2  Subjective: Patient without complaints.  Small liquid BM yesterday.  On clear liquid diet.  Objective: Vital signs in last 24 hours: Temp:  [98 F (36.7 C)-98.6 F (37 C)] 98.1 F (36.7 C) (06/13 0630) Pulse Rate:  [75-96] 82 (06/13 0630) Resp:  [16-20] 16 (06/13 0630) BP: (84-110)/(54-73) 104/68 mmHg (06/13 0630) SpO2:  [95 %-100 %] 95 % (06/13 0630) Weight:  [138 lb 0.1 oz (62.6 kg)] 138 lb 0.1 oz (62.6 kg) (06/13 0630)    Intake/Output from previous day: 06/12 0701 - 06/13 0700 In: 1680 [P.O.:480; I.V.:1200] Out: 1750 [Urine:1250; Stool:500]  Exam: HEENT - clear, not icteric Neck - soft Chest - clear bilaterally Cor - RRR, no murmur Abd - soft, mild distension; few BS present; dressings intact Ext - no significant edema Neuro - grossly intact, no focal deficits  Lab Results:   Recent Labs  07/17/13 0435  WBC 8.4  HGB 9.9*  HCT 29.1*  PLT 236     Recent Labs  07/17/13 0435  NA 140  K 3.4*  CL 104  CO2 24  GLUCOSE 144*  BUN 5*  CREATININE 0.74  CALCIUM 7.9*    Studies/Results: No results found.  Assessment / Plan: 1.  Status post robotic left colectomy for diverticular disease  Clear liquid diet  Encouraged OOB, ambulation, IS use  Earnstine Regal, MD, [facility] Office: [phone]  07/18/2013

## 2013-07-19 NOTE — Progress Notes (Signed)
Patient ID: Tracey Fields, female   DOB: September 28, 1960, 53 y.o.   MRN: [id number]  [facility] - Progress Note  POD# 3  Subjective: Patient doing well, sister at bedside.  Mild nausea.  Small liquid BM, no real flatus yet.  Moderate pain right abdominal wall.  Objective: Vital signs in last 24 hours: Temp:  [98 F (36.7 C)-98.4 F (36.9 C)] 98 F (36.7 C) (06/14 0649) Pulse Rate:  [71-88] 71 (06/14 0934) Resp:  [14-23] 18 (06/14 0649) BP: (96-121)/(63-77) 108/72 mmHg (06/14 0934) SpO2:  [97 %-100 %] 98 % (06/14 0649) FiO2 (%):  [48 %] 48 % (06/13 2059)    Intake/Output from previous day: 06/13 0701 - 06/14 0700 In: 960 [P.O.:360; I.V.:600] Out: 2550 [Urine:2550]  Exam: HEENT - clear, not icteric Neck - soft Chest - clear bilaterally Cor - RRR, no murmur Abd - moderate distension; BS present; wounds/dressings dry and intact; diffuse mild tenderness Ext - no significant edema Neuro - grossly intact, no focal deficits  Lab Results:   Recent Labs  07/17/13 0435  WBC 8.4  HGB 9.9*  HCT 29.1*  PLT 236     Recent Labs  07/17/13 0435  NA 140  K 3.4*  CL 104  CO2 24  GLUCOSE 144*  BUN 5*  CREATININE 0.74  CALCIUM 7.9*    Studies/Results: No results found.  Assessment / Plan: 1. Status post robotic left colectomy for diverticular disease   Continue on clear liquid diet   Encouraged OOB, ambulation, IS use  Earnstine Regal, MD, [facility] Office: [phone]  07/19/2013

## 2013-07-20 ENCOUNTER — Telehealth (INDEPENDENT_AMBULATORY_CARE_PROVIDER_SITE_OTHER): Payer: Self-pay | Admitting: General Surgery

## 2013-07-20 MED ORDER — NAPROXEN 500 MG PO TABS
500.0000 mg | ORAL_TABLET | Freq: Two times a day (BID) | ORAL | Status: DC
  Administered 2013-07-20 – 2013-07-21 (×3): 500 mg via ORAL
  Filled 2013-07-20 (×7): qty 1

## 2013-07-20 MED ORDER — SODIUM CHLORIDE 0.9 % IJ SOLN
3.0000 mL | Freq: Two times a day (BID) | INTRAMUSCULAR | Status: DC
  Administered 2013-07-20 (×2): 3 mL via INTRAVENOUS

## 2013-07-20 MED ORDER — OXYCODONE HCL 5 MG PO TABS
5.0000 mg | ORAL_TABLET | ORAL | Status: DC | PRN
  Administered 2013-07-20 – 2013-07-21 (×5): 10 mg via ORAL
  Filled 2013-07-20 (×5): qty 2

## 2013-07-20 MED ORDER — LACTATED RINGERS IV BOLUS (SEPSIS): 1000.0000 mL | Freq: Three times a day (TID) | INTRAVENOUS | Status: DC | PRN

## 2013-07-20 MED ORDER — SODIUM CHLORIDE 0.9 % IJ SOLN
3.0000 mL | INTRAMUSCULAR | Status: DC | PRN
Start: 2013-07-20 — End: 2013-07-21

## 2013-07-20 NOTE — Telephone Encounter (Signed)
Message copied by Flossie Buffy on Mon Jul 20, 2013  2:13 PM ------      Message from: Michael Boston C      Created: Mon Jul 20, 2013  1:29 PM       No evidence of cancer.  No polyps.  Diverticulitis as suspected.  Copy to patient. ------

## 2013-07-20 NOTE — Progress Notes (Signed)
Pharmacy Brief Note - Alvimopan (Entereg)  The standing order set for alvimopan (Entereg) now includes an automatic order to discontinue the drug after the patient has had a bowel movement.  The change was approved by the [facility] and the Medical Executive Committee.    This patient has had BMs documented by nursing.  Therefore, alvimopan has been discontinued.  If there are questions, please contact the pharmacy at [phone].  Thank you-  Clayburn Pert, PharmD, BCPS Pager: [phone] 07/20/2013  11:50 AM

## 2013-07-20 NOTE — Progress Notes (Signed)
[place], MD, [address] [place], [place] [address] Phone: [phone] FAX: [phone]    Tracey Fields [id number] 08-13-1960  CARE TEAM:  PCP: Cathlean Cower, MD  Outpatient Care Team: Patient Care Team: Biagio Borg, MD as PCP - General  Inpatient Treatment Team: Treatment Team: Attending Provider: [name], MD; Technician: Leda Quail, NT; Technician: Rondel Jumbo, NT; Student Nurse: Whitney Walker   Subjective:  Feeling better.  Tolerating liquids   Right-sided abdominal pain.  Having flatus and small bowel movement.  Sister in room.  Both with many questions.  Objective:  Vital signs:  Filed Vitals:   07/19/13 2045 07/19/13 2327 07/20/13 0118 07/20/13 0627  BP: 133/87  125/72 101/67  Pulse: 75  80 70  Temp: 98.1 F (36.7 C)  98.1 F (36.7 C) 97.8 F (36.6 C)  TempSrc: Oral  Oral Oral  Resp: 11 18 16 14   Height:      Weight:    155 lb 4.8 oz (70.444 kg)  SpO2: 96% 97% 94% 96%       Intake/Output   Yesterday:  06/14 0701 - 06/15 0700 In: 1800 [I.V.:1800] Out: 2651 [Urine:2650; Stool:1] This shift:     Bowel function:  Flatus: YES  BM: x2  Drain: n/a  Physical Exam:  General: Pt awake/alert/oriented x4 in mod acute distress Eyes: PERRL, normal EOM.  Sclera clear.  No icterus Neuro: CN II-XII intact w/o focal sensory/motor deficits. Lymph: No head/neck/groin lymphadenopathy Psych:  No delerium/psychosis/paranoia.  Tearful but consolable HENT: Normocephalic, Mucus membranes moist.  No thrush Neck: Supple, No tracheal deviation.  No swelling Chest: No chest wall pain w fair excursion.  Lungs mostly clear.  Some productive cough CV:  Pulses intact.  Regular rhythm MS: Normal AROM mjr joints.  No obvious deformity Abdomen: Soft.  Nondistended.  Dressings c/d/i.  Mod tender at incisions only.  No evidence of peritonitis.  No incarcerated  hernias. Ext:  SCDs BLE.  No mjr edema.  No cyanosis Skin: No petechiae / purpura   Problem List:   Principal Problem:   Diverticulitis of large intestine with abscess s/p left colectomy 07/16/2013 Active Problems:   Pseudocholinesterase deficiency   ANXIETY   DEPRESSION   HYPERTENSION   COPD (chronic obstructive pulmonary disease)   Insomnia   Chronic pancreatitis   Tobacco abuse   Assessment  Tracey Fields  53 y.o. female  4 Days Post-Op  Procedure(s): ROBOT ASSISTED LAPAROSCOPIC LEFT COLECTOMY SPLENIC FLEXURE IMMOBILIZATION WITH RIGID PROCTOSCOPY  Improving  Plan:  -I again discussed with the nurse that I did not want her to stop all oral medications.  It is felt it is not appropriate to start oral narcotics postop day zero from a colon resection.  Get her back on her usual anti-depression and anxiety meds.  -wean off morphine PCA.   -switch to PO pain control.  Naproxen / oxycodone.  Cont ice/heat.  Robaxin prn -anxiolysis - Xanax.  Lorazepam prn if poor PO tolerance -f/u path -VTE prophylaxis- SCDs, etc -mobilize as tolerated to help recovery -abstain from smoking  I updated the patient's status to the patient & her sister.  Recommendations were made.  I noted that the fact that she is walking in hallways and in the chair is a good sign of her motivation to get better. Questions were answered.  The patient & sister expressed understanding & appreciation.   [name], M.D.,  F.A.C.S. Gastrointestinal and Minimally Invasive Surgery [facility] [address] [place], [place] [address] [phone] Main / Paging   07/20/2013   Results:   Labs: No results found for this or any previous visit (from the past 48 hour(s)).  Imaging / Studies: No results found.  Medications / Allergies: per chart  Antibiotics: Anti-infectives   Start     Dose/Rate Route Frequency Ordered Stop   07/16/13 2000  cefoTEtan (CEFOTAN) 2 g  in dextrose 5 % 50 mL IVPB     2 g 100 mL/hr over 30 Minutes Intravenous Every 12 hours 07/16/13 1319 07/16/13 2047   07/16/13 1030  clindamycin (CLEOCIN) 900 mg, gentamicin (GARAMYCIN) 240 mg in sodium chloride 0.9 % 1,000 mL for intraperitoneal lavage      Intraperitoneal To Surgery 07/16/13 1002 07/16/13 1051   07/16/13 0619  cefoTEtan (CEFOTAN) 2 g in dextrose 5 % 50 mL IVPB     2 g 100 mL/hr over 30 Minutes Intravenous On call to O.R. 07/16/13 8115 07/16/13 0742       Note: This dictation was prepared with Dragon/digital dictation along with Smartphrase technology. Any transcriptional errors that result from this process are unintentional.

## 2013-07-21 NOTE — Progress Notes (Signed)
Discharge instructions reviewed with patient and patient's sister, vital signs are stable, incisions are within normal limits, patient is tolerating her diet without complaints of nausea or vomiting, oxycodone adequate for pain relief for patient when alternating with naproxen, questions and concerns answered, patient to follow up with MD within 2 weeks Neta Mends RN 07-21-2013 12:58pm

## 2013-07-21 NOTE — Discharge Summary (Signed)
Physician Discharge Summary  Patient ID: Tracey Fields MRN: [id number] DOB/AGE: [birth year] 53 y.o.  Admit date: 07/16/2013 Discharge date: 07/21/2013  Admission Diagnoses:  Discharge Diagnoses:  Principal Problem:   Diverticulitis of large intestine with abscess s/p left colectomy 07/16/2013 Active Problems:   Pseudocholinesterase deficiency   ANXIETY   DEPRESSION   HYPERTENSION   COPD (chronic obstructive pulmonary disease)   Insomnia   Chronic pancreatitis   Tobacco abuse   Discharged Condition: good  Hospital Course: The patient underwent the surgery above.  Postoperatively, the patient gradually mobilized in the hallways and advanced to a solid diet.  Pain and other symptoms  were treated aggressively.    By the time of discharge, the patient was walking well the hallways, eating food well, having flatus.  Pain was well-controlled on an oral regimen.  Based on meeting discharge criteria and continuing to recover, I felt it was safe for the patient to be discharged from the hospital with close followup.  Instructions were discussed in detail.  They are written as well.    Consults: None  Significant Diagnostic Studies:   No results found for this or any previous visit (from the past 72 hour(s)).  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 Colon, segmental resection, descending - DIVERTICULOSIS. - TWO BENIGN LYMPH NODES. - NO DYSPLASIA OR MALIGNANCY. 2. Colon, resection margin (donut) - BENIGN COLONIC MUCOSA. Tracey Males MD Pathologist, Electronic Signature (Case signed 07/20/2013) Specimen Tracey Fields and Clinical Information Specimen(s) Obtained: 1. Colon, segmental resection,  descending 2. Colon, resection margin (donut) Specimen Clinical Information 1. Recurrent sigmoid diverticulitis with colon fistula (je) Tracey Fields 1. Specimen: Received in formalin labeled descending and sigmoid colon. Specimen integrity: Intact, with one open end (designated proximal per requisition). Length: 32.5 cm. Serosa: Tan-pink with attached tan-yellow adipose tissue. Contents: The lumen contains a moderate amount of green-brown fecal material. Mucosa/Wall: The mucosa is tan-pink with normal folding. There are multiple tan sessile polyps identified in the proximal half of the specimen, ranging from 0.1 to 0.5 cm. The polyps extend to within 1.7 cm of the proximal resection margin. Throughout the specimen multiple diverticula are identified. The wall ranges from 0.3 to 0.8 cm in thickness. Lymph nodes: Two lymph nodes are identified, measuring 0.6 and 0.7 cm in greatest dimension. Block Summary: Six blocks submitted. A = proximal resection margin. B = distal resection margin. C = representative sections of mucosal polyps. 1 of 2 FINAL for Tracey Fields, Tracey Fields [phone]) Tracey Fields(continued) D, E = diverticula. F = two possible lymph nodes. (KL:ecj 07/17/2013) 2. The specimen is received in formalin and consists of two rings of tan-brown tissue, with a blue stitch designating the proximal ring. The proximal ring measures 2.3 x 2.0 cm in length, and up to 1.0 cm in length, and the distal ring measures 2.4 x 2.3 cm in diameter, and 2.1 cm in length. The distal ring is partially disrupted, and consists of two rings of colonic tissue held together by a thin layer of membranous tissue. The exposed mucosa of both rings is tan-brown and focally hyperemic. Representative sections are submitted in two cassettes: A = proximal ring. B = distal ring. Report signed out from the following location(s) Technical Component and Interpretation performed at [facility]  [place],  [place],  [id number]. CLIA #: [id number],   Discharge Exam: Blood pressure 107/68, pulse 76, temperature 97.9 F (36.6 C), temperature source Oral, resp. rate 16, height 5\' 5"  (1.651 m), weight 130 lb 11.2 oz (59.285 kg), SpO2 97.00%.  General: Pt awake/alert/oriented x4 in no major acute distress Eyes: PERRL, normal EOM. Sclera nonicteric Neuro: CN II-XII intact w/o focal sensory/motor deficits. Lymph: No head/neck/groin lymphadenopathy Psych:  No delerium/psychosis/paranoia.  Smiling/chatty HENT: Normocephalic, Mucus membranes moist.  No thrush Neck: Supple, No tracheal deviation Chest: No pain.  Good respiratory excursion. CV:  Pulses intact.  Regular rhythm MS: Normal AROM mjr joints.  No obvious deformity Abdomen: Soft, Nondistended.  Min tender.  No incarcerated hernias.  Incisions well closed Ext:  SCDs BLE.  No significant edema.  No cyanosis Skin: No petechiae / purpura   Disposition: 01-Home or Self Care  Discharge Instructions   Call MD for:  extreme fatigue    Complete by:  As directed      Call MD for:  hives    Complete by:  As directed      Call MD for:  persistant nausea and vomiting    Complete by:  As directed      Call MD for:  redness, tenderness, or signs of infection (pain, swelling, redness, odor or green/yellow discharge around incision site)    Complete by:  As directed      Call MD for:  severe uncontrolled pain    Complete by:  As directed      Call MD for:    Complete by:  As directed   Temperature > 101.60F     Diet - low sodium heart healthy    Complete by:  As directed      Discharge instructions    Complete by:  As directed   Please see discharge instruction sheets.  Also refer to handout given an office.  Please call our office if you have any questions or concerns [phone]     Discharge wound care:    Complete by:  As directed   If you have closed incisions, shower and bathe over these incisions with soap and water every day.  Remove  all surgical dressings on postoperative day #3.  You do not need to replace dressings over the closed incisions unless you feel more comfortable with a Band-Aid covering it.   If you have an open wound that requires packing, please see wound care instructions.  In general, remove all dressings, wash wound with soap and water and then replace with saline moistened gauze.  Do the dressing change at least every day.  Please call our office [phone] if you have further questions.     Driving Restrictions    Complete by:  As directed   No driving until off narcotics and can safely swerve away without pain during an emergency     Increase activity slowly    Complete by:  As directed   Walk an hour a day.  Use 20-30 minute walks.  When you can walk 30 minutes without difficulty, increase to low impact/moderate activities such as biking, jogging, swimming, sexual activity..  Eventually can increase to unrestricted activity when not feeling pain.  If you feel pain: STOP!Marland Kitchen   Let pain protect you from overdoing it.  Use ice/heat/over-the-counter pain medications to help minimize his soreness.  Use pain prescriptions as needed to remain active.  It is better to take extra pain medications and be more  active than to stay bedridden to avoid all pain medications.     Lifting restrictions    Complete by:  As directed   Avoid heavy lifting initially.  Do not push through pain.  You have no specific weight limit.  Coughing and sneezing or four more stressful to your incision than any lifting you will do. Pain will protect you from injury.  Therefore, avoid intense activity until off all narcotic pain medications.  Coughing and sneezing or four more stressful to your incision than any lifting he will do.     May shower / Bathe    Complete by:  As directed      May walk up steps    Complete by:  As directed      Sexual Activity Restrictions    Complete by:  As directed   Sexual activity as tolerated.  Do not push  through pain.  Pain will protect you from injury.     Walk with assistance    Complete by:  As directed   Walk over an hour a day.  May use a walker/cane/companion to help with balance and stamina.            Medication List    STOP taking these medications       ALEVE 220 MG tablet  Generic drug:  naproxen sodium     amoxicillin-clavulanate 875-125 MG per tablet  Commonly known as:  AUGMENTIN     sodium chloride 0.9 % injection      TAKE these medications       ALPRAZolam 0.5 MG tablet  Commonly known as:  XANAX  Take 0.25 mg by mouth 3 (three) times daily as needed for anxiety.     aspirin EC 81 MG tablet  Take 81 mg by mouth at bedtime.     b complex vitamins capsule  Take 1 capsule by mouth at bedtime.     cetirizine 10 MG tablet  Commonly known as:  ZYRTEC  Take 5 mg by mouth at bedtime.     CREON 36000 UNITS Cpep  Generic drug:  Pancrelipase (Lip-Prot-Amyl)  Take 36,000 Units by mouth. TAKE ONE PILL WITH EACH MEAL     desvenlafaxine 100 MG 24 hr tablet  Commonly known as:  PRISTIQ  Take 200 mg by mouth every morning.     naproxen 500 MG tablet  Commonly known as:  NAPROSYN  Take 1 tablet (500 mg total) by mouth 2 (two) times daily with a meal.     oxyCODONE 5 MG immediate release tablet  Commonly known as:  Oxy IR/ROXICODONE  Take 1-2 tablets (5-10 mg total) by mouth every 4 (four) hours as needed for moderate pain or severe pain.     PROBIOTIC ACIDOPHILUS PO  Take 1 capsule by mouth every morning.     propranolol 10 MG tablet  Commonly known as:  INDERAL  Take 1 tablet (10 mg total) by mouth every morning.     zolpidem 10 MG tablet  Commonly known as:  AMBIEN  Take 5 mg by mouth at bedtime as needed for sleep.           Follow-up Information   Follow up with Varonica Siharath C., MD. Schedule an appointment as soon as possible for a visit in 2 weeks. (To follow up after your operation)    Specialty:  General Surgery   Contact information:    [place] [address] [phone]  Signed: Jacolyn Joaquin C. 07/21/2013, 7:31 AM

## 2013-07-22 NOTE — Telephone Encounter (Signed)
Left message with pt's sister to have her call us when she can.  This is so that we may inform her of the surgical pathology results listed below from Dr. Johney Maine.

## 2013-07-22 NOTE — Telephone Encounter (Signed)
Pt returning call and was given pathology results.  Follow up appt scheduled with Dr. Johney Maine on 08/06/13 to discuss.

## 2013-07-28 ENCOUNTER — Other Ambulatory Visit (INDEPENDENT_AMBULATORY_CARE_PROVIDER_SITE_OTHER): Payer: Self-pay

## 2013-07-28 ENCOUNTER — Telehealth (INDEPENDENT_AMBULATORY_CARE_PROVIDER_SITE_OTHER): Payer: Self-pay | Admitting: General Surgery

## 2013-07-28 MED ORDER — NAPROXEN 500 MG PO TABS: 500.0000 mg | ORAL_TABLET | Freq: Two times a day (BID) | ORAL | Status: DC

## 2013-07-28 MED ORDER — OXYCODONE-ACETAMINOPHEN 5-325 MG PO TABS: 1.0000 | ORAL_TABLET | ORAL | Status: DC | PRN

## 2013-07-28 NOTE — Telephone Encounter (Signed)
Informed pt that Naproxen rx was sent to her pharmacy. Informed pt that Dr Johney Maine wanted her to take this no matter if she felt it was not working. Pt verbalized understanding. Informed pt that Oxycodone 5/325 rx was ready for her to pick up at the front desk. Encouraged pt to quit smoking. She states that she has been trying to stop but no success.

## 2013-07-28 NOTE — Telephone Encounter (Signed)
Patient called in wanting a refill on her pain medication.  She explained that the oxycodone has not been working for her but the percocet that was given to her in the hospital worked better.  She rates her pain at a 9 on a 0-10 scale.  She explains that it is only this way when she has moved around too much during the day, so she is taking this medication more at night time to help facilitate sleep.  She describes the pain as sharp, intermittent, and an increasing pressure in the right side of the abdomen.  She has not been using the ibuprofen due to it not working.  Informed her that I will send this refill request to Dr. Johney Maine and that we will get back in touch with her when we get a response.  Educated her on using the ibuprofen in between narcotic doses to help her ween on the narcotics.  Also informed her the could use heat and ice over the abdomen to help with discomfort.

## 2013-07-28 NOTE — Telephone Encounter (Signed)
Give her naproxen 500 mg by mouth twice a day.  #40.  Refill=1.  Take that instead of ibuprofen.  Take that even if she does not feel like it is do anything.  It will help.  She will ignore this advice, but at least document you gave it.  Percocet 5/325 1-2 by mouth every 4 hours when necessary pain #40 .  Refill = 0  Encourage her to quit smoking

## 2013-08-06 ENCOUNTER — Ambulatory Visit (INDEPENDENT_AMBULATORY_CARE_PROVIDER_SITE_OTHER): Payer: Medicaid Other | Admitting: Surgery

## 2013-08-06 ENCOUNTER — Encounter (INDEPENDENT_AMBULATORY_CARE_PROVIDER_SITE_OTHER): Payer: Self-pay | Admitting: Surgery

## 2013-08-06 VITALS — BP 118/60 | HR 77 | Temp 98.8°F | Resp 16 | Ht 65.0 in | Wt 131.0 lb

## 2013-08-06 DIAGNOSIS — R1031 Right lower quadrant pain: Secondary | ICD-10-CM | POA: Insufficient documentation

## 2013-08-06 DIAGNOSIS — K5720 Diverticulitis of large intestine with perforation and abscess without bleeding: Secondary | ICD-10-CM

## 2013-08-06 DIAGNOSIS — Z720 Tobacco use: Secondary | ICD-10-CM

## 2013-08-06 DIAGNOSIS — K5732 Diverticulitis of large intestine without perforation or abscess without bleeding: Secondary | ICD-10-CM

## 2013-08-06 DIAGNOSIS — K630 Abscess of intestine: Secondary | ICD-10-CM

## 2013-08-06 DIAGNOSIS — F17200 Nicotine dependence, unspecified, uncomplicated: Secondary | ICD-10-CM

## 2013-08-06 MED ORDER — METHOCARBAMOL 750 MG PO TABS: 750.0000 mg | ORAL_TABLET | Freq: Four times a day (QID) | ORAL | Status: DC | PRN

## 2013-08-06 MED ORDER — NAPROXEN 500 MG PO TABS: 500.0000 mg | ORAL_TABLET | Freq: Two times a day (BID) | ORAL | Status: DC

## 2013-08-06 MED ORDER — OXYCODONE-ACETAMINOPHEN 5-325 MG PO TABS: 1.0000 | ORAL_TABLET | ORAL | Status: DC | PRN

## 2013-08-06 MED ORDER — OXYCODONE HCL 5 MG PO TABS: 5.0000 mg | ORAL_TABLET | ORAL | Status: DC | PRN

## 2013-08-06 NOTE — Progress Notes (Signed)
Subjective:     Patient ID: Tracey Fields, female   DOB: 1960-11-23, 53 y.o.   MRN: [id number]  HPI  Note: This dictation was prepared with Dragon/digital dictation along with [facility] technology. Any transcriptional errors that result from this process are unintentional.       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 Colon, segmental resection, descending - DIVERTICULOSIS. - TWO BENIGN LYMPH NODES. - NO DYSPLASIA OR MALIGNANCY. 2. Colon, resection margin (donut) - BENIGN COLONIC MUCOSA. [name] MD Pathologist, Electronic Signature (Case signed 07/20/2013  This patient returns for surgical re-evaluation.  She still has moderate pain in her right lower abdomen.  It seemed to improve.  Then she felt a sharp pull when she reached down to pick something up last week.  Using Percocet.  Using naproxen.  She still smokes.  Trying to switch to vapor cigarettes.  Having 3 bowel movements a day.  That is much better than before.  No fevers or chills.  Let the pain has faded away on the left side but very concerned about pain on right side.  No nausea or vomiting.  Appetite better.  Has many questions.  Patient Active Problem List   Diagnosis Date Noted  . 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 Diverticulitis of large intestine with abscess s/p left colectomy 07/16/2013 07/16/2013  . Diastasis recti 06/17/2013  . Tobacco abuse 05/21/2013  . Alcohol intake above recommended sensible limits with complication 24/10/7351  . Chronic pancreatitis 07/29/2012  . COPD (chronic obstructive pulmonary disease)  04/01/2012  . 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 Insomnia 04/01/2012  . Impaired glucose tolerance 10/19/2010  . BACK PAIN 01/12/2009  . HOARSENESS, CHRONIC 08/13/2008  . FATIGUE 07/14/2008  . DEPRESSION 12/29/2007  . HYPERTENSION 12/29/2007  . DIVERTICULOSIS, COLON 12/29/2007  . ANXIETY 09/21/2006  . 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 Irritable bowel syndrome 09/21/2006    Past Medical History  Diagnosis Date  . Abdominal pain, left lower quadrant 02/24/2010  . ANXIETY 09/21/2006  . BACK PAIN 01/12/2009  . COUGH, CHRONIC 09/21/2006  . DEPRESSION 12/29/2007  . DIVERTICULOSIS, COLON 12/29/2007  . DYSPNEA 06/02/2008  . FATIGUE 07/14/2008  . HEMORRHOIDS, HX OF 09/21/2006  . HOARSENESS, CHRONIC 08/13/2008  . HYPERTENSION 12/29/2007  . Irritable bowel syndrome 09/21/2006  . 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 SORE THROAT 07/14/2008  . Wheezing 05/18/2009  . Impaired glucose tolerance 10/19/2010  . Chronic pancreatitis 07/29/2012  . Mitral valve prolapse   . Left leg numbness   . COPD (chronic obstructive pulmonary disease)   . Arthritis     thumbs   . Cancer     hx of skin cancers on face   . Family history of anesthesia complication     ? malignant hyerthermia- grandfather   . 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 Malignant hyperthermia     Past Surgical History  Procedure Laterality Date  . Fractured jaw  1980    required wiring  . Colonoscopy    . Upper gastrointestinal endoscopy    . Colonoscopy  07/15/2013     History   Social History  . Marital Status: Divorced  Spouse Name: N/A    Number of Children: 2  . Years of Education: N/A   Occupational History  . independent [employer]    Social History Main Topics  . Smoking status: Current Every Day Smoker -- 1.00 packs/day for 35 years    Types: Cigarettes  . Smokeless tobacco: Never Used     Comment: 1 ppd, patient has been counseled to quit smoking 08/13/08  . Alcohol Use: No      Comment: social  . Drug Use: No  . Sexual Activity: Yes   Other Topics Concern  . Not on file   Social History Narrative  . No narrative on file    Family History  Problem Relation Age of Onset  . Lung cancer Father   . Prostate cancer Father   . Diabetes Maternal Grandfather   . Colon polyps Other   . Hypertension Mother   . COPD Mother     Current Outpatient Prescriptions  Medication Sig Dispense Refill  . ALPRAZolam (XANAX) 0.5 MG tablet Take 0.25 mg by mouth 3 (three) times daily as needed for anxiety.       Marland Kitchen b complex vitamins capsule Take 1 capsule by mouth at bedtime.       . cetirizine (ZYRTEC) 10 MG tablet Take 5 mg by mouth at bedtime.       Marland Kitchen desvenlafaxine (PRISTIQ) 100 MG 24 hr tablet Take 200 mg by mouth every morning.       . Lactobacillus (PROBIOTIC ACIDOPHILUS PO) Take 1 capsule by mouth every morning.      . naproxen (NAPROSYN) 500 MG tablet Take 1 tablet (500 mg total) by mouth 2 (two) times daily with a meal.  40 tablet  1  . oxyCODONE (OXY IR/ROXICODONE) 5 MG immediate release tablet Take 1-2 tablets (5-10 mg total) by mouth every 4 (four) hours as needed for moderate pain or severe pain.  40 tablet  0  . oxyCODONE-acetaminophen (ROXICET) 5-325 MG per tablet Take 1-2 tablets by mouth every 4 (four) hours as needed for severe pain.  40 tablet  0  . Pancrelipase, Lip-Prot-Amyl, (CREON) 36000 UNITS CPEP Take 36,000 Units by mouth. TAKE ONE PILL WITH EACH MEAL      . propranolol (INDERAL) 10 MG tablet Take 1 tablet (10 mg total) by mouth every morning.  30 tablet  1  . zolpidem (AMBIEN) 10 MG tablet Take 5 mg by mouth at bedtime as needed for sleep.      Marland Kitchen aspirin EC 81 MG tablet Take 81 mg by mouth at bedtime.        No current facility-administered medications for this visit.     Allergies  Allergen Reactions  . Anesthetics, Amide Other (See Comments)    System shut down when 53 years old having jaw work done  . Succinylcholine Chloride Other (See  Comments)    Major organs stopped working    BP 118/60  Pulse 77  Temp(Src) 98.8 F (37.1 C)  Resp 16  Ht 5\' 5"  (1.651 m)  Wt 131 lb (59.421 kg)  BMI 21.80 kg/m2  No results found.   Review of Systems  Constitutional: Negative for fever, chills and diaphoresis.  HENT: Negative for ear pain, sore throat and trouble swallowing.   Eyes: Negative for photophobia and visual disturbance.  Respiratory: Negative for cough and choking.   Cardiovascular: Negative for chest pain and palpitations.  Gastrointestinal: Negative for nausea, vomiting, abdominal pain, diarrhea, constipation, anal bleeding and rectal pain.  Genitourinary: Negative for dysuria, frequency and difficulty urinating.  Musculoskeletal: Negative for gait problem and myalgias.  Skin: Negative for color change, pallor and rash.  Neurological: Negative for dizziness, speech difficulty, weakness and numbness.  Hematological: Negative for adenopathy.  Psychiatric/Behavioral: Negative for confusion and agitation. The patient is not nervous/anxious.        Objective:   Physical Exam  Constitutional: She is oriented to person, place, and time. She appears well-developed and well-nourished. No distress.  HENT:  Head: Normocephalic.  Mouth/Throat: Oropharynx is clear and moist. No oropharyngeal exudate.  Eyes: Conjunctivae and EOM are normal. Pupils are equal, round, and reactive to light. No scleral icterus.  Neck: Normal range of motion. No tracheal deviation present.  Cardiovascular: Normal rate and intact distal pulses.   Pulmonary/Chest: Effort normal. No respiratory distress. She exhibits no tenderness.  Abdominal: Soft. She exhibits no distension. There is tenderness in the right lower quadrant. There is no rigidity, no rebound, no guarding, no tenderness at McBurney's point and negative Murphy's sign. No hernia. Hernia confirmed negative in the ventral area, confirmed negative in the right inguinal area and confirmed  negative in the left inguinal area.    Incisions clean with normal healing ridges.  No hernias  Genitourinary: No vaginal discharge found.  Musculoskeletal: Normal range of motion. She exhibits no tenderness.  Lymphadenopathy:       Right: No inguinal adenopathy present.       Left: No inguinal adenopathy present.  Neurological: She is alert and oriented to person, place, and time. No cranial nerve deficit. She exhibits normal muscle tone. Coordination normal.  Skin: Skin is warm and dry. No rash noted. She is not diaphoretic.  Psychiatric: She has a normal mood and affect. Her behavior is normal.       Assessment:     Gradually recovering status post robotically assisted colectomy for left-sided diverticulitis and chronic abscess/fistula.  Persistent right lower quadrant pain.  Most likely musculoskeletal but persistent.     Plan:     Obtain CT scan of abdomen and pelvis to rule out abscess or other abnormality since persistent pain.  Increase activity as tolerated to regular activity.  Low impact exercise such as walking an hour a day at least ideal.  Do not push through pain.  Okay to swim/bathe.  Reviewed oxycodone and naproxen.  Add Robaxin for breakthrough pain.  Use ice and heat.  Diet as tolerated.  Low fat high fiber diet ideal.  Bowel regimen with 30 g fiber a day and fiber supplement as needed to avoid problems.  Return to clinic 3 weeks, sooner as needed.   Instructions discussed.  Followup with primary care physician for other health issues as would normally be done.  Consider screening for malignancies (breast, prostate, colon, melanoma, etc) as appropriate.  STOP SMOKING! We talked to the patient about the dangers of smoking.  We stressed that tobacco use dramatically increases the risk of peri-operative complications such as infection, tissue necrosis leaving to problems with incision/wound and organ healing, hernia, chronic pain, heart attack, stroke, DVT, pulmonary  embolism, and death.  We noted there are programs in our community to help stop smoking.  Information was available.  Questions answered.  The patient expressed understanding and appreciation

## 2013-08-06 NOTE — Patient Instructions (Addendum)
ABDOMINAL SURGERY: POST OP INSTRUCTIONS  1. DIET: Follow a light bland diet the first 24 hours after arrival home, such as soup, liquids, crackers, etc.  Be sure to include lots of fluids daily.  Avoid fast food or heavy meals as your are more likely to get nauseated.  Eat a low fat the next few days after surgery.   2. Take your usually prescribed home medications unless otherwise directed. 3. PAIN CONTROL: a. Pain is best controlled by a usual combination of three different methods TOGETHER: i. Ice/Heat ii. Over the counter pain medication iii. Prescription pain medication b. Most patients will experience some swelling and bruising around the incisions.  Ice packs or heating pads (30-60 minutes up to 6 times a day) will help. Use ice for the first few days to help decrease swelling and bruising, then switch to heat to help relax tight/sore spots and speed recovery.  Some people prefer to use ice alone, heat alone, alternating between ice & heat.  Experiment to what works for you.  Swelling and bruising can take several weeks to resolve.   c. It is helpful to take an over-the-counter pain medication regularly for the first few weeks.  Choose one of the following that works best for you: i. Naproxen (Aleve, etc)  Two 262m tabs twice a day ii. Ibuprofen (Advil, etc) Three 2066mtabs four times a day (every meal & bedtime) iii. Acetaminophen (Tylenol, etc) 500-65011mour times a day (every meal & bedtime) d. A  prescription for pain medication (such as oxycodone, hydrocodone, etc) should be given to you upon discharge.  Take your pain medication as prescribed.  i. If you are having problems/concerns with the prescription medicine (does not control pain, nausea, vomiting, rash, itching, etc), please call us [phone] see if we need to switch you to a different pain medicine that will work better for you and/or control your side effect better. ii. If you need a refill on your pain medication,  please contact your pharmacy.  They will contact our office to request authorization. Prescriptions will not be filled after 5 pm or on week-ends. 4. Avoid getting constipated.  Between the surgery and the pain medications, it is common to experience some constipation.  Increasing fluid intake and taking a fiber supplement (such as Metamucil, Citrucel, FiberCon, MiraLax, etc) 1-2 times a day regularly will usually help prevent this problem from occurring.  A mild laxative (prune juice, Milk of Magnesia, MiraLax, etc) should be taken according to package directions if there are no bowel movements after 48 hours.   5. Watch out for diarrhea.  If you have many loose bowel movements, simplify your diet to bland foods & liquids for a few days.  Stop any stool softeners and decrease your fiber supplement.  Switching to mild anti-diarrheal medications (Kayopectate, Pepto Bismol) can help.  If this worsens or does not improve, please call us.[place]. Wash / shower every day.  You may shower over the incision / wound.  Avoid baths until the skin is fully healed.  Continue to shower over incision(s) after the dressing is off. 7. Remove your waterproof bandages 5 days after surgery.  You may leave the incision open to air.  You may replace a dressing/Band-Aid to cover the incision for comfort if you wish. 8. ACTIVITIES as tolerated:   a. You may resume regular (light) daily activities beginning the next day-such as daily self-care, walking, climbing stairs-gradually increasing activities as tolerated.  If you can  walk 30 minutes without difficulty, it is safe to try more intense activity such as jogging, treadmill, bicycling, low-impact aerobics, swimming, etc. b. Save the most intensive and strenuous activity for last such as sit-ups, heavy lifting, contact sports, etc  Refrain from any heavy lifting or straining until you are off narcotics for pain control.   c. DO NOT PUSH THROUGH PAIN.  Let pain be your guide: If it  hurts to do something, don't do it.  Pain is your body warning you to avoid that activity for another week until the pain goes down. d. You may drive when you are no longer taking prescription pain medication, you can comfortably wear a seatbelt, and you can safely maneuver your car and apply brakes. e. Dennis Bast may have sexual intercourse when it is comfortable.  f. It is okay to bathe or swim as tolerated since incisions are closed.  Keep the area clean and dry afterwards 9. FOLLOW UP in our office a. Please call [facility] at [phone] to set up an appointment to see your surgeon in the office for a follow-up appointment approximately 1-2 weeks after your surgery. b. Make sure that you call for this appointment the day you arrive home to insure a convenient appointment time. 10. IF YOU HAVE DISABILITY OR FAMILY LEAVE FORMS, BRING THEM TO THE OFFICE FOR PROCESSING.  DO NOT GIVE THEM TO YOUR DOCTOR.   WHEN TO CALL us [phone]: 1. Poor pain control 2. Reactions / problems with new medications (rash/itching, nausea, etc)  3. Fever over 101.5 F (38.5 C) 4. Inability to urinate 5. Nausea and/or vomiting 6. Worsening swelling or bruising 7. Continued bleeding from incision. 8. Increased pain, redness, or drainage from the incision  The clinic staff is available to answer your questions during regular business hours (8:30am-5pm).  Please don't hesitate to call and ask to speak to one of our nurses for clinical concerns.   A surgeon from [facility] is always on call at the hospitals   If you have a medical emergency, go to the nearest emergency room or call 911.    [facility], [address], [place], [place], [place]  [address] ? MAIN: [phone] ? TOLL FREE: [phone] ? FAX [phone] [email or web address]  Managing Pain  Pain after surgery or related to activity is often due to strain/injury to muscle, tendon, nerves and/or  incisions.  This pain is usually short-term and will improve in a few months.   Many people find it helpful to do the following things TOGETHER to help speed the process of healing and to get back to regular activity more quickly:  1. Avoid heavy physical activity a.  no lifting greater than 20 pounds b. Do not "push through" the pain.  Listen to your body and avoid positions and maneuvers than reproduce the pain c. Walking is okay as tolerated, but go slowly and stop when getting sore.  d. Remember: If it hurts to do it, then don't do it! 2. Take Anti-inflammatory medication  a. Take with food/snack around the clock for 1-2 weeks i. This helps the muscle and nerve tissues become less irritable and calm down faster b. Choose ONE of the following over-the-counter medications: i. Naproxen 220mg  tabs (ex. Aleve) 1-2 pills twice a day  ii. Ibuprofen 200mg  tabs (ex. Advil, Motrin) 3-4 pills with every meal and just before bedtime iii. Acetaminophen 500mg  tabs (Tylenol) 1-2 pills with every meal and just before bedtime 3.  Use a Heating pad or Ice/Cold Pack a. 4-6 times a day b. May use warm bath/hottub  or showers 4. Try Gentle Massage and/or Stretching  a. at the area of pain many times a day b. stop if you feel pain - do not overdo it  Try these steps together to help you body heal faster and avoid making things get worse.  Doing just one of these things may not be enough.    If you are not getting better after two weeks or are noticing you are getting worse, contact our office for further advice; we may need to re-evaluate you & see what other things we can do to help.  GETTING TO GOOD BOWEL HEALTH. Irregular bowel habits such as constipation and diarrhea can lead to many problems over time.  Having one soft bowel movement a day is the most important way to prevent further problems.  The anorectal canal is designed to handle stretching and feces to safely manage our ability to get rid of  solid waste (feces, poop, stool) out of our body.  BUT, hard constipated stools can act like ripping concrete bricks and diarrhea can be a burning fire to this very sensitive area of our body, causing inflamed hemorrhoids, anal fissures, increasing risk is perirectal abscesses, abdominal pain/bloating, an making irritable bowel worse.     The goal: ONE SOFT BOWEL MOVEMENT A DAY!  To have soft, regular bowel movements:    Drink at least 8 tall glasses of water a day.     Take plenty of fiber.  Fiber is the undigested part of plant food that passes into the colon, acting s "natures broom" to encourage bowel motility and movement.  Fiber can absorb and hold large amounts of water. This results in a larger, bulkier stool, which is soft and easier to pass. Work gradually over several weeks up to 6 servings a day of fiber (25g a day even more if needed) in the form of: o Vegetables -- Root (potatoes, carrots, turnips), leafy green (lettuce, salad greens, celery, spinach), or cooked high residue (cabbage, broccoli, etc) o Fruit -- Fresh (unpeeled skin & pulp), Dried (prunes, apricots, cherries, etc ),  or stewed ( applesauce)  o Whole grain breads, pasta, etc (whole wheat)  o Bran cereals    Bulking Agents -- This type of water-retaining fiber generally is easily obtained each day by one of the following:  o Psyllium bran -- The psyllium plant is remarkable because its ground seeds can retain so much water. This product is available as Metamucil, Konsyl, Effersyllium, Per Diem Fiber, or the less expensive generic preparation in drug and health food stores. Although labeled a laxative, it really is not a laxative.  o Methylcellulose -- This is another fiber derived from wood which also retains water. It is available as Citrucel. o Polyethylene Glycol - and "artificial" fiber commonly called Miralax or Glycolax.  It is helpful for people with gassy or bloated feelings with regular fiber o Flax Seed - a less gassy  fiber than psyllium   No reading or other relaxing activity while on the toilet. If bowel movements take longer than 5 minutes, you are too constipated   AVOID CONSTIPATION.  High fiber and water intake usually takes care of this.  Sometimes a laxative is needed to stimulate more frequent bowel movements, but    Laxatives are not a good long-term solution as it can wear the colon out. o Osmotics (Milk of Magnesia, Fleets phosphosoda,  Magnesium citrate, MiraLax, GoLytely) are safer than  o Stimulants (Senokot, Castor Oil, Dulcolax, Ex Lax)    o Do not take laxatives for more than 7days in a row.    IF SEVERELY CONSTIPATED, try a Bowel Retraining Program: o Do not use laxatives.  o Eat a diet high in roughage, such as bran cereals and leafy vegetables.  o Drink six (6) ounces of prune or apricot juice each morning.  o Eat two (2) large servings of stewed fruit each day.  o Take one (1) heaping tablespoon of a psyllium-based bulking agent twice a day. Use sugar-free sweetener when possible to avoid excessive calories.  o Eat a normal breakfast.  o Set aside 15 minutes after breakfast to sit on the toilet, but do not strain to have a bowel movement.  o If you do not have a bowel movement by the third day, use an enema and repeat the above steps.    Controlling diarrhea o Switch to liquids and simpler foods for a few days to avoid stressing your intestines further. o Avoid dairy products (especially milk & ice cream) for a short time.  The intestines often can lose the ability to digest lactose when stressed. o Avoid foods that cause gassiness or bloating.  Typical foods include beans and other legumes, cabbage, broccoli, and dairy foods.  Every person has some sensitivity to other foods, so listen to our body and avoid those foods that trigger problems for you. o Adding fiber (Citrucel, Metamucil, psyllium, Miralax) gradually can help thicken stools by absorbing excess fluid and retrain the  intestines to act more normally.  Slowly increase the dose over a few weeks.  Too much fiber too soon can backfire and cause cramping & bloating. o Probiotics (such as active yogurt, Align, etc) may help repopulate the intestines and colon with normal bacteria and calm down a sensitive digestive tract.  Most studies show it to be of mild help, though, and such products can be costly. o Medicines:   Bismuth subsalicylate (ex. Kayopectate, Pepto Bismol) every 30 minutes for up to 6 doses can help control diarrhea.  Avoid if pregnant.   Loperamide (Immodium) can slow down diarrhea.  Start with two tablets (4mg  total) first and then try one tablet every 6 hours.  Avoid if you are having fevers or severe pain.  If you are not better or start feeling worse, stop all medicines and call your doctor for advice o Call your doctor if you are getting worse or not better.  Sometimes further testing (cultures, endoscopy, X-ray studies, bloodwork, etc) may be needed to help diagnose and treat the cause of the diarrhea.  Diverticulosis Diverticulosis is the condition that develops when small pouches (diverticula) form in the wall of your colon. Your colon, or large intestine, is where water is absorbed and stool is formed. The pouches form when the inside layer of your colon pushes through weak spots in the outer layers of your colon. CAUSES  No one knows exactly what causes diverticulosis. RISK FACTORS  Being older than 35. Your risk for this condition increases with age. Diverticulosis is rare in people younger than 40 years. By age 32, almost everyone has it.  Eating a low-fiber diet.  Being frequently constipated.  Being overweight.  Not getting enough exercise.  Smoking.  Taking over-the-counter pain medicines, like aspirin and ibuprofen. SYMPTOMS  Most people with diverticulosis do not have symptoms. DIAGNOSIS  Because diverticulosis often has no symptoms, health care providers often  discover the  condition during an exam for other colon problems. In many cases, a health care provider will diagnose diverticulosis while using a flexible scope to examine the colon (colonoscopy). TREATMENT  If you have never developed an infection related to diverticulosis, you may not need treatment. If you have had an infection before, treatment may include:  Eating more fruits, vegetables, and grains.  Taking a fiber supplement.  Taking a live bacteria supplement (probiotic).  Taking medicine to relax your colon. HOME CARE INSTRUCTIONS   Drink at least 6-8 glasses of water each day to prevent constipation.  Try not to strain when you have a bowel movement.  Keep all follow-up appointments. If you have had an infection before:  Increase the fiber in your diet as directed by your health care provider or dietitian.  Take a dietary fiber supplement if your health care provider approves.  Only take medicines as directed by your health care provider. SEEK MEDICAL CARE IF:   You have abdominal pain.  You have bloating.  You have cramps.  You have not gone to the bathroom in 3 days. SEEK IMMEDIATE MEDICAL CARE IF:   Your pain gets worse.  Yourbloating becomes very bad.  You have a fever or chills, and your symptoms suddenly get worse.  You begin vomiting.  You have bowel movements that are bloody or black. MAKE SURE YOU:  Understand these instructions.  Will watch your condition.  Will get help right away if you are not doing well or get worse. Document Released: 10/20/2003 Document Revised: 01/27/2013 Document Reviewed: 12/17/2012 [facility] Patient Information 2015 [place], [place]. This information is not intended to replace advice given to you by your health care provider. Make sure you discuss any questions you have with your health care provider.  STOP SMOKING!  We strongly recommend that you stop smoking.  Smoking increases the risk of surgery including infection in the  form of an open wound, pus formation, abscess, hernia at an incision on the abdomen, etc.  You have an increased risk of other MAJOR complications such as stroke, heart attack, forming clots in the leg and/or lungs, and death.    Smoking Cessation Quitting smoking is important to your health and has many advantages. However, it is not always easy to quit since nicotine is a very addictive drug. Often times, people try 3 times or more before being able to quit. This document explains the best ways for you to prepare to quit smoking. Quitting takes hard work and a lot of effort, but you can do it. ADVANTAGES OF QUITTING SMOKING  You will live longer, feel better, and live better.  Your body will feel the impact of quitting smoking almost immediately.  Within 20 minutes, blood pressure decreases. Your pulse returns to its normal level.  After 8 hours, carbon monoxide levels in the blood return to normal. Your oxygen level increases.  After 24 hours, the chance of having a heart attack starts to decrease. Your breath, hair, and body stop smelling like smoke.  After 48 hours, damaged nerve endings begin to recover. Your sense of taste and smell improve.  After 72 hours, the body is virtually free of nicotine. Your bronchial tubes relax and breathing becomes easier.  After 2 to 12 weeks, lungs can hold more air. Exercise becomes easier and circulation improves.  The risk of having a heart attack, stroke, cancer, or lung disease is greatly reduced.  After 1 year, the risk of coronary heart disease is  cut in half.  After 5 years, the risk of stroke falls to the same as a nonsmoker.  After 10 years, the risk of lung cancer is cut in half and the risk of other cancers decreases significantly.  After 15 years, the risk of coronary heart disease drops, usually to the level of a nonsmoker.  If you are pregnant, quitting smoking will improve your chances of having a healthy baby.  The people you  live with, especially any children, will be healthier.  You will have extra money to spend on things other than cigarettes. QUESTIONS TO THINK ABOUT BEFORE ATTEMPTING TO QUIT You may want to talk about your answers with your caregiver.  Why do you want to quit?  If you tried to quit in the past, what helped and what did not?  What will be the most difficult situations for you after you quit? How will you plan to handle them?  Who can help you through the tough times? Your family? Friends? A caregiver?  What pleasures do you get from smoking? What ways can you still get pleasure if you quit? Here are some questions to ask your caregiver:  How can you help me to be successful at quitting?  What medicine do you think would be best for me and how should I take it?  What should I do if I need more help?  What is smoking withdrawal like? How can I get information on withdrawal? GET READY  Set a quit date.  Change your environment by getting rid of all cigarettes, ashtrays, matches, and lighters in your home, car, or work. Do not let people smoke in your home.  Review your past attempts to quit. Think about what worked and what did not. GET SUPPORT AND ENCOURAGEMENT You have a better chance of being successful if you have help. You can get support in many ways.  Tell your family, friends, and co-workers that you are going to quit and need their support. Ask them not to smoke around you.  Get individual, group, or telephone counseling and support. Programs are available at [employer] and health centers. Call your local health department for information about programs in your area.  Spiritual beliefs and practices may help some smokers quit.  Download a "quit meter" on your computer to keep track of quit statistics, such as how long you have gone without smoking, cigarettes not smoked, and money saved.  Get a self-help book about quitting smoking and staying off of  tobacco. [place] yourself from urges to smoke. Talk to someone, go for a walk, or occupy your time with a task.  Change your normal routine. Take a different route to work. Drink tea instead of coffee. Eat breakfast in a different place.  Reduce your stress. Take a hot bath, exercise, or read a book.  Plan something enjoyable to do every day. Reward yourself for not smoking.  Explore interactive web-based programs that specialize in helping you quit. GET MEDICINE AND USE IT CORRECTLY Medicines can help you stop smoking and decrease the urge to smoke. Combining medicine with the above behavioral methods and support can greatly increase your chances of successfully quitting smoking.  Nicotine replacement therapy helps deliver nicotine to your body without the negative effects and risks of smoking. Nicotine replacement therapy includes nicotine gum, lozenges, inhalers, nasal sprays, and skin patches. Some may be available over-the-counter and others require a prescription.  Antidepressant medicine helps people abstain from  smoking, but how this works is unknown. This medicine is available by prescription.  Nicotinic receptor partial agonist medicine simulates the effect of nicotine in your brain. This medicine is available by prescription. Ask your caregiver for advice about which medicines to use and how to use them based on your health history. Your caregiver will tell you what side effects to look out for if you choose to be on a medicine or therapy. Carefully read the information on the package. Do not use any other product containing nicotine while using a nicotine replacement product.  RELAPSE OR DIFFICULT SITUATIONS Most relapses occur within the first 3 months after quitting. Do not be discouraged if you start smoking again. Remember, most people try several times before finally quitting. You may have symptoms of withdrawal because your body is used to  nicotine. You may crave cigarettes, be irritable, feel very hungry, cough often, get headaches, or have difficulty concentrating. The withdrawal symptoms are only temporary. They are strongest when you first quit, but they will go away within 10 14 days. To reduce the chances of relapse, try to:  Avoid drinking alcohol. Drinking lowers your chances of successfully quitting.  Reduce the amount of caffeine you consume. Once you quit smoking, the amount of caffeine in your body increases and can give you symptoms, such as a rapid heartbeat, sweating, and anxiety.  Avoid smokers because they can make you want to smoke.  Do not let weight gain distract you. Many smokers will gain weight when they quit, usually less than 10 pounds. Eat a healthy diet and stay active. You can always lose the weight gained after you quit.  Find ways to improve your mood other than smoking. FOR MORE INFORMATION  www.smokefree.gov    While it can be one of the most difficult things to do, the [place] community has programs to help you stop.  Consider talking with your primary care physician about options.  Also, Smoking Cessation classes are available through the [facility]:  The smoking cessation program is a proven-effective program from the American Lung Association. The program is available for anyone 37 and older who currently smokes. The program lasts for 7 weeks and is 8 sessions. Each class will be approximately 1 1/2 hours. The program is every Tuesday.  All classes are 12-1:30pm and same location.  Event Location Information:  Location: [place] 2nd Floor Conference Room 2-037; located next to [facility] cross streets: [place] Entrance into the [facility] is adjacent to the [employer] main entrance. The conference room is located on the 2nd floor.  Parking Instructions: Visitor parking is adjacent to [employer] main entrance and the [place]    A smoking cessation program is also offered through the [facility]. Register online at [email or web address] or call [phone] for more information.   Tobacco cessation counseling is available at [facility]. Call [phone] for a free appointment.   Tobacco cessation classes also are available through the [facility] in [place]. For information, call [phone].   The Patient Education Network features videos on tobacco cessation. Please consult your listings in the center of this book to find instructions on how to access this resource.   If you want more information, ask your nurse.

## 2013-08-11 ENCOUNTER — Ambulatory Visit
Admission: RE | Admit: 2013-08-11 | Discharge: 2013-08-11 | Disposition: A | Discharge status: Home / Self Care | Payer: Medicaid Other | Source: Ambulatory Visit | Attending: Surgery | Admitting: Surgery

## 2013-08-11 ENCOUNTER — Other Ambulatory Visit (INDEPENDENT_AMBULATORY_CARE_PROVIDER_SITE_OTHER): Payer: Self-pay | Admitting: Surgery

## 2013-08-11 DIAGNOSIS — K5720 Diverticulitis of large intestine with perforation and abscess without bleeding: Secondary | ICD-10-CM

## 2013-08-11 DIAGNOSIS — R1031 Right lower quadrant pain: Secondary | ICD-10-CM

## 2013-08-11 MED ORDER — AMOXICILLIN-POT CLAVULANATE 875-125 MG PO TABS: 1.0000 | ORAL_TABLET | Freq: Two times a day (BID) | ORAL | Status: DC

## 2013-08-11 MED ORDER — IOHEXOL 300 MG/ML  SOLN
100.0000 mL | Freq: Once | INTRAMUSCULAR | Status: AC | PRN
  Administered 2013-08-11: 100 mL via INTRAVENOUS

## 2013-08-12 ENCOUNTER — Telehealth (INDEPENDENT_AMBULATORY_CARE_PROVIDER_SITE_OTHER): Payer: Self-pay

## 2013-08-12 NOTE — Telephone Encounter (Signed)
Pt called to notify her of the CT results showing small fluid collections per Dr Johney [place]. I advised pt that Dr Johney [place] e prescribed Augmentin 875mg  BID x 7 days just in case if these collections are abscesses. I advised pt to call me if any changes occur or she feels that she is getting worse instead of better with the discomfort. Pt understands. I made the pt a f/u appt with Dr Johney [place] for 7/27 arrive at 2:15/2:30.

## 2013-08-12 NOTE — Telephone Encounter (Signed)
Message copied by Illene Regulus on Wed Aug 12, 2013 12:25 PM ------      Message from: [name]      Created: Tue Aug 11, 2013  4:25 PM       Small collections - I e-Rx'd PO ABX = Augmentin 875mg  PO BID x7 days in case they may be abscesses.  Please call & check on her & tell her of rec for antibiotics x 1 week ------

## 2013-08-31 ENCOUNTER — Encounter (INDEPENDENT_AMBULATORY_CARE_PROVIDER_SITE_OTHER): Payer: Self-pay | Admitting: Surgery

## 2013-08-31 ENCOUNTER — Ambulatory Visit (INDEPENDENT_AMBULATORY_CARE_PROVIDER_SITE_OTHER): Payer: Medicaid Other | Admitting: Surgery

## 2013-08-31 VITALS — BP 122/68 | HR 73 | Temp 98.0°F | Ht 65.0 in | Wt 134.0 lb

## 2013-08-31 DIAGNOSIS — R1031 Right lower quadrant pain: Secondary | ICD-10-CM

## 2013-08-31 DIAGNOSIS — K630 Abscess of intestine: Secondary | ICD-10-CM

## 2013-08-31 DIAGNOSIS — K5720 Diverticulitis of large intestine with perforation and abscess without bleeding: Secondary | ICD-10-CM

## 2013-08-31 DIAGNOSIS — K5732 Diverticulitis of large intestine without perforation or abscess without bleeding: Secondary | ICD-10-CM

## 2013-08-31 NOTE — Patient Instructions (Signed)
GETTING TO GOOD BOWEL HEALTH. Irregular bowel habits such as constipation and diarrhea can lead to many problems over time.  Having one soft bowel movement a day is the most important way to prevent further problems.  The anorectal canal is designed to handle stretching and feces to safely manage our ability to get rid of solid waste (feces, poop, stool) out of our body.  BUT, hard constipated stools can act like ripping concrete bricks and diarrhea can be a burning fire to this very sensitive area of our body, causing inflamed hemorrhoids, anal fissures, increasing risk is perirectal abscesses, abdominal pain/bloating, an making irritable bowel worse.     The goal: ONE SOFT BOWEL MOVEMENT A DAY!  To have soft, regular bowel movements:    Drink at least 8 tall glasses of water a day.     Take plenty of fiber.  Fiber is the undigested part of plant food that passes into the colon, acting s "natures broom" to encourage bowel motility and movement.  Fiber can absorb and hold large amounts of water. This results in a larger, bulkier stool, which is soft and easier to pass. Work gradually over several weeks up to 6 servings a day of fiber (25g a day even more if needed) in the form of: o Vegetables -- Root (potatoes, carrots, turnips), leafy green (lettuce, salad greens, celery, spinach), or cooked high residue (cabbage, broccoli, etc) o Fruit -- Fresh (unpeeled skin & pulp), Dried (prunes, apricots, cherries, etc ),  or stewed ( applesauce)  o Whole grain breads, pasta, etc (whole wheat)  o Bran cereals    Bulking Agents -- This type of water-retaining fiber generally is easily obtained each day by one of the following:  o Psyllium bran -- The psyllium plant is remarkable because its ground seeds can retain so much water. This product is available as Metamucil, Konsyl, Effersyllium, Per Diem Fiber, or the less expensive generic preparation in drug and health food stores. Although labeled a laxative, it really  is not a laxative.  o Methylcellulose -- This is another fiber derived from wood which also retains water. It is available as Citrucel. o Polyethylene Glycol - and "artificial" fiber commonly called Miralax or Glycolax.  It is helpful for people with gassy or bloated feelings with regular fiber o Flax Seed - a less gassy fiber than psyllium   No reading or other relaxing activity while on the toilet. If bowel movements take longer than 5 minutes, you are too constipated   AVOID CONSTIPATION.  High fiber and water intake usually takes care of this.  Sometimes a laxative is needed to stimulate more frequent bowel movements, but    Laxatives are not a good long-term solution as it can wear the colon out. o Osmotics (Milk of Magnesia, Fleets phosphosoda, Magnesium citrate, MiraLax, GoLytely) are safer than  o Stimulants (Senokot, Castor Oil, Dulcolax, Ex Lax)    o Do not take laxatives for more than 7days in a row.    IF SEVERELY CONSTIPATED, try a Bowel Retraining Program: o Do not use laxatives.  o Eat a diet high in roughage, such as bran cereals and leafy vegetables.  o Drink six (6) ounces of prune or apricot juice each morning.  o Eat two (2) large servings of stewed fruit each day.  o Take one (1) heaping tablespoon of a psyllium-based bulking agent twice a day. Use sugar-free sweetener when possible to avoid excessive calories.  o Eat a normal breakfast.  o  Set aside 15 minutes after breakfast to sit on the toilet, but do not strain to have a bowel movement.  o If you do not have a bowel movement by the third day, use an enema and repeat the above steps.    Controlling diarrhea o Switch to liquids and simpler foods for a few days to avoid stressing your intestines further. o Avoid dairy products (especially milk & ice cream) for a short time.  The intestines often can lose the ability to digest lactose when stressed. o Avoid foods that cause gassiness or bloating.  Typical foods include  beans and other legumes, cabbage, broccoli, and dairy foods.  Every person has some sensitivity to other foods, so listen to our body and avoid those foods that trigger problems for you. o Adding fiber (Citrucel, Metamucil, psyllium, Miralax) gradually can help thicken stools by absorbing excess fluid and retrain the intestines to act more normally.  Slowly increase the dose over a few weeks.  Too much fiber too soon can backfire and cause cramping & bloating. o Probiotics (such as active yogurt, Align, etc) may help repopulate the intestines and colon with normal bacteria and calm down a sensitive digestive tract.  Most studies show it to be of mild help, though, and such products can be costly. o Medicines:   Bismuth subsalicylate (ex. Kayopectate, Pepto Bismol) every 30 minutes for up to 6 doses can help control diarrhea.  Avoid if pregnant.   Loperamide (Immodium) can slow down diarrhea.  Start with two tablets (23m total) first and then try one tablet every 6 hours.  Avoid if you are having fevers or severe pain.  If you are not better or start feeling worse, stop all medicines and call your doctor for advice o Call your doctor if you are getting worse or not better.  Sometimes further testing (cultures, endoscopy, X-ray studies, bloodwork, etc) may be needed to help diagnose and treat the cause of the diarrhea.  Diverticulosis Diverticulosis is the condition that develops when small pouches (diverticula) form in the wall of your colon. Your colon, or large intestine, is where water is absorbed and stool is formed. The pouches form when the inside layer of your colon pushes through weak spots in the outer layers of your colon. CAUSES  No one knows exactly what causes diverticulosis. RISK FACTORS  Being older than 547 Your risk for this condition increases with age. Diverticulosis is rare in people younger than 40 years. By age 53 almost everyone has it.  Eating a low-fiber diet.  Being  frequently constipated.  Being overweight.  Not getting enough exercise.  Smoking.  Taking over-the-counter pain medicines, like aspirin and ibuprofen. SYMPTOMS  Most people with diverticulosis do not have symptoms. DIAGNOSIS  Because diverticulosis often has no symptoms, health care providers often discover the condition during an exam for other colon problems. In many cases, a health care provider will diagnose diverticulosis while using a flexible scope to examine the colon (colonoscopy). TREATMENT  If you have never developed an infection related to diverticulosis, you may not need treatment. If you have had an infection before, treatment may include:  Eating more fruits, vegetables, and grains.  Taking a fiber supplement.  Taking a live bacteria supplement (probiotic).  Taking medicine to relax your colon. HOME CARE INSTRUCTIONS   Drink at least 6-8 glasses of water each day to prevent constipation.  Try not to strain when you have a bowel movement.  Keep all follow-up appointments. If you  have had an infection before:  Increase the fiber in your diet as directed by your health care provider or dietitian.  Take a dietary fiber supplement if your health care provider approves.  Only take medicines as directed by your health care provider. SEEK MEDICAL CARE IF:   You have abdominal pain.  You have bloating.  You have cramps.  You have not gone to the bathroom in 3 days. SEEK IMMEDIATE MEDICAL CARE IF:   Your pain gets worse.  Yourbloating becomes very bad.  You have a fever or chills, and your symptoms suddenly get worse.  You begin vomiting.  You have bowel movements that are bloody or black. MAKE SURE YOU:  Understand these instructions.  Will watch your condition.  Will get help right away if you are not doing well or get worse. Document Released: 10/20/2003 Document Revised: 01/27/2013 Document Reviewed: 12/17/2012 [facility] Patient  Information 2015 [place], [place]. This information is not intended to replace advice given to you by your health care provider. Make sure you discuss any questions you have with your health care provider.  Managing Pain  Pain after surgery or related to activity is often due to strain/injury to muscle, tendon, nerves and/or incisions.  This pain is usually short-term and will improve in a few months.   Many people find it helpful to do the following things TOGETHER to help speed the process of healing and to get back to regular activity more quickly:  1. Avoid heavy physical activity a.  no lifting greater than 20 pounds b. Do not "push through" the pain.  Listen to your body and avoid positions and maneuvers than reproduce the pain c. Walking is okay as tolerated, but go slowly and stop when getting sore.  d. Remember: If it hurts to do it, then don't do it! 2. Take Anti-inflammatory medication  a. Take with food/snack around the clock for 1-2 weeks i. This helps the muscle and nerve tissues become less irritable and calm down faster b. Choose ONE of the following over-the-counter medications: i. Naproxen 251m tabs (ex. Aleve) 1-2 pills twice a day  ii. Ibuprofen 2057mtabs (ex. Advil, Motrin) 3-4 pills with every meal and just before bedtime iii. Acetaminophen 50052mabs (Tylenol) 1-2 pills with every meal and just before bedtime 3. Use a Heating pad or Ice/Cold Pack a. 4-6 times a day b. May use warm bath/hottub  or showers 4. Try Gentle Massage and/or Stretching  a. at the area of pain many times a day b. stop if you feel pain - do not overdo it  Try these steps together to help you body heal faster and avoid making things get worse.  Doing just one of these things may not be enough.    If you are not getting better after two weeks or are noticing you are getting worse, contact our office for further advice; we may need to re-evaluate you & see what other things we can do to  help.  Exercise to Stay Healthy Exercise helps you become and stay healthy. EXERCISE IDEAS AND TIPS Choose exercises that:  You enjoy.  Fit into your day. You do not need to exercise really hard to be healthy. You can do exercises at a slow or medium level and stay healthy. You can:  Stretch before and after working out.  Try yoga, Pilates, or tai chi.  Lift weights.  Walk fast, swim, jog, run, climb stairs, bicycle, dance, or rollerskate.  Take aerobic classes. Exercises  that burn about 150 calories:  Running 1  miles in 15 minutes.  Playing volleyball for 45 to 60 minutes.  Washing and waxing a car for 45 to 60 minutes.  Playing touch football for 45 minutes.  Walking 1  miles in 35 minutes.  Pushing a stroller 1  miles in 30 minutes.  Playing basketball for 30 minutes.  Raking leaves for 30 minutes.  Bicycling 5 miles in 30 minutes.  Walking 2 miles in 30 minutes.  Dancing for 30 minutes.  Shoveling snow for 15 minutes.  Swimming laps for 20 minutes.  Walking up stairs for 15 minutes.  Bicycling 4 miles in 15 minutes.  Gardening for 30 to 45 minutes.  Jumping rope for 15 minutes.  Washing windows or floors for 45 to 60 minutes. Document Released: 02/24/2010 Document Revised: 04/16/2011 Document Reviewed: 02/24/2010 [facility] Patient Information 2015 [place], [place]. This information is not intended to replace advice given to you by your health care provider. Make sure you discuss any questions you have with your health care provider.  STOP SMOKING!  We strongly recommend that you stop smoking.  Smoking increases the risk of surgery including infection in the form of an open wound, pus formation, abscess, hernia at an incision on the abdomen, etc.  You have an increased risk of other MAJOR complications such as stroke, heart attack, forming clots in the leg and/or lungs, and death.    Smoking Cessation Quitting smoking is important to your health  and has many advantages. However, it is not always easy to quit since nicotine is a very addictive drug. Often times, people try 3 times or more before being able to quit. This document explains the best ways for you to prepare to quit smoking. Quitting takes hard work and a lot of effort, but you can do it. ADVANTAGES OF QUITTING SMOKING  You will live longer, feel better, and live better.  Your body will feel the impact of quitting smoking almost immediately.  Within 20 minutes, blood pressure decreases. Your pulse returns to its normal level.  After 8 hours, carbon monoxide levels in the blood return to normal. Your oxygen level increases.  After 24 hours, the chance of having a heart attack starts to decrease. Your breath, hair, and body stop smelling like smoke.  After 48 hours, damaged nerve endings begin to recover. Your sense of taste and smell improve.  After 72 hours, the body is virtually free of nicotine. Your bronchial tubes relax and breathing becomes easier.  After 2 to 12 weeks, lungs can hold more air. Exercise becomes easier and circulation improves.  The risk of having a heart attack, stroke, cancer, or lung disease is greatly reduced.  After 1 year, the risk of coronary heart disease is cut in half.  After 5 years, the risk of stroke falls to the same as a nonsmoker.  After 10 years, the risk of lung cancer is cut in half and the risk of other cancers decreases significantly.  After 15 years, the risk of coronary heart disease drops, usually to the level of a nonsmoker.  If you are pregnant, quitting smoking will improve your chances of having a healthy baby.  The people you live with, especially any children, will be healthier.  You will have extra money to spend on things other than cigarettes. QUESTIONS TO THINK ABOUT BEFORE ATTEMPTING TO QUIT You may want to talk about your answers with your caregiver.  Why do you want to quit?  If you tried to quit in  the past, what helped and what did not?  What will be the most difficult situations for you after you quit? How will you plan to handle them?  Who can help you through the tough times? Your family? Friends? A caregiver?  What pleasures do you get from smoking? What ways can you still get pleasure if you quit? Here are some questions to ask your caregiver:  How can you help me to be successful at quitting?  What medicine do you think would be best for me and how should I take it?  What should I do if I need more help?  What is smoking withdrawal like? How can I get information on withdrawal? GET READY  Set a quit date.  Change your environment by getting rid of all cigarettes, ashtrays, matches, and lighters in your home, car, or work. Do not let people smoke in your home.  Review your past attempts to quit. Think about what worked and what did not. GET SUPPORT AND ENCOURAGEMENT You have a better chance of being successful if you have help. You can get support in many ways.  Tell your family, friends, and co-workers that you are going to quit and need their support. Ask them not to smoke around you.  Get individual, group, or telephone counseling and support. Programs are available at [employer] and health centers. Call your local health department for information about programs in your area.  Spiritual beliefs and practices may help some smokers quit.  Download a "quit meter" on your computer to keep track of quit statistics, such as how long you have gone without smoking, cigarettes not smoked, and money saved.  Get a self-help book about quitting smoking and staying off of tobacco. [place] yourself from urges to smoke. Talk to someone, go for a walk, or occupy your time with a task.  Change your normal routine. Take a different route to work. Drink tea instead of coffee. Eat breakfast in a different place.  Reduce your stress. Take a  hot bath, exercise, or read a book.  Plan something enjoyable to do every day. Reward yourself for not smoking.  Explore interactive web-based programs that specialize in helping you quit. GET MEDICINE AND USE IT CORRECTLY Medicines can help you stop smoking and decrease the urge to smoke. Combining medicine with the above behavioral methods and support can greatly increase your chances of successfully quitting smoking.  Nicotine replacement therapy helps deliver nicotine to your body without the negative effects and risks of smoking. Nicotine replacement therapy includes nicotine gum, lozenges, inhalers, nasal sprays, and skin patches. Some may be available over-the-counter and others require a prescription.  Antidepressant medicine helps people abstain from smoking, but how this works is unknown. This medicine is available by prescription.  Nicotinic receptor partial agonist medicine simulates the effect of nicotine in your brain. This medicine is available by prescription. Ask your caregiver for advice about which medicines to use and how to use them based on your health history. Your caregiver will tell you what side effects to look out for if you choose to be on a medicine or therapy. Carefully read the information on the package. Do not use any other product containing nicotine while using a nicotine replacement product.  RELAPSE OR DIFFICULT SITUATIONS Most relapses occur within the first 3 months after quitting. Do not be discouraged if you start smoking again. Remember, most people try several  times before finally quitting. You may have symptoms of withdrawal because your body is used to nicotine. You may crave cigarettes, be irritable, feel very hungry, cough often, get headaches, or have difficulty concentrating. The withdrawal symptoms are only temporary. They are strongest when you first quit, but they will go away within 10 14 days. To reduce the chances of relapse, try to:  Avoid  drinking alcohol. Drinking lowers your chances of successfully quitting.  Reduce the amount of caffeine you consume. Once you quit smoking, the amount of caffeine in your body increases and can give you symptoms, such as a rapid heartbeat, sweating, and anxiety.  Avoid smokers because they can make you want to smoke.  Do not let weight gain distract you. Many smokers will gain weight when they quit, usually less than 10 pounds. Eat a healthy diet and stay active. You can always lose the weight gained after you quit.  Find ways to improve your mood other than smoking. FOR MORE INFORMATION  www.smokefree.gov    While it can be one of the most difficult things to do, the [place] community has programs to help you stop.  Consider talking with your primary care physician about options.  Also, Smoking Cessation classes are available through the [facility]:  The smoking cessation program is a proven-effective program from the American Lung Association. The program is available for anyone 35 and older who currently smokes. The program lasts for 7 weeks and is 8 sessions. Each class will be approximately 1 1/2 hours. The program is every Tuesday.  All classes are 12-1:30pm and same location.  Event Location Information:  Location: [place] 2nd Floor Conference Room 2-037; located next to [facility] cross streets: [place] Entrance into the [facility] is adjacent to the [employer] main entrance. The conference room is located on the 2nd floor.  Parking Instructions: Visitor parking is adjacent to [employer] main entrance and the [place]    A smoking cessation program is also offered through the [facility]. Register online at [email or web address] or call [phone] for more information.   Tobacco cessation counseling is available at [facility]. Call [phone] for a  free appointment.   Tobacco cessation classes also are available through the [place] in [place]. For information, call [phone].   The Patient Education Network features videos on tobacco cessation. Please consult your listings in the center of this book to find instructions on how to access this resource.   If you want more information, ask your nurse.

## 2013-08-31 NOTE — Progress Notes (Signed)
Subjective:     Patient ID: Tracey Fields, female   DOB: [birth year], 53 y.o.   MRN: [id number]  HPI   Note: This dictation was prepared with Dragon/digital dictation along with [facility] technology. Any transcriptional errors that result from this process are unintentional.       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 Colon, segmental resection, descending - DIVERTICULOSIS. - TWO BENIGN LYMPH NODES. - NO DYSPLASIA OR MALIGNANCY. 2. Colon, resection margin (donut) - BENIGN COLONIC MUCOSA. Tracey Males MD Pathologist, Electronic Signature (Case signed 07/20/2013  This patient returns for surgical re-evaluation.  She still has pain in her right lower abdomen, but it is definitely more mild.  CAT scan revealed a few small fluid collections.  Placed on Augmentin antibiotics for 10 days.  Finished that 3 days ago.  Still feeling better.   Hepatitis been much better.  Energy level slowly coming back and feels quite worn down.  Hoping to get back to exercising.  Using naproxen.  She still smokes.  Tried to switch to vapor cigarettes.  Having occasional loose bowel movements a day.  That is much better than before.  No fevers or chills.    Mild nausea but no vomiting.  Appetite better.  Stone trouble sleeping at night.  Has many questions.  Patient Active Problem List   Diagnosis Date Noted  . 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 Abdominal pain, right lower quadrant 08/06/2013  . Diverticulitis of large intestine with abscess s/p left colectomy 07/16/2013 07/16/2013  . Diastasis recti 06/17/2013  . Tobacco  abuse 05/21/2013  . Alcohol intake above recommended sensible limits with complication 63/87/5643  . Chronic pancreatitis 07/29/2012  . COPD (chronic obstructive pulmonary disease) 04/01/2012  . 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 Insomnia 04/01/2012  . Impaired glucose tolerance 10/19/2010  . BACK PAIN 01/12/2009  . HOARSENESS, CHRONIC 08/13/2008  . FATIGUE 07/14/2008  . DEPRESSION 12/29/2007  . HYPERTENSION 12/29/2007  . DIVERTICULOSIS, COLON 12/29/2007  . ANXIETY 09/21/2006  . 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 Irritable bowel syndrome 09/21/2006    Past Medical History  Diagnosis Date  . Abdominal pain, left lower quadrant 02/24/2010  . ANXIETY 09/21/2006  . BACK PAIN 01/12/2009  . COUGH, CHRONIC 09/21/2006  . DEPRESSION 12/29/2007  . DIVERTICULOSIS, COLON 12/29/2007  . DYSPNEA 06/02/2008  . FATIGUE 07/14/2008  . HEMORRHOIDS, HX OF 09/21/2006  . HOARSENESS, CHRONIC 08/13/2008  . HYPERTENSION 12/29/2007  . Irritable bowel syndrome 09/21/2006  . 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 SORE THROAT 07/14/2008  . Wheezing 05/18/2009  . Impaired glucose tolerance 10/19/2010  . Chronic pancreatitis 07/29/2012  . Mitral valve prolapse   . Left leg numbness   . COPD (chronic obstructive pulmonary disease)   . Arthritis     thumbs   . Cancer     hx of skin cancers on face   . Family history of anesthesia complication     ? malignant hyerthermia- grandfather   . 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 Malignant hyperthermia     Past Surgical History  Procedure Laterality Date  . Fractured jaw  1980    required wiring  .  Colonoscopy    . Upper gastrointestinal endoscopy    . Colonoscopy  07/15/2013     History   Social History  . Marital Status: Divorced    Spouse Name: N/A    Number of Children: 2  . Years of Education: N/A   Occupational History  . independent Chief Strategy Officer    Social History Main Topics  . Smoking status: Current Every Day  Smoker -- 1.00 packs/day for 35 years    Types: Cigarettes  . Smokeless tobacco: Never Used     Comment: 1 ppd, patient has been counseled to quit smoking 08/13/08  . Alcohol Use: No     Comment: social  . Drug Use: No  . Sexual Activity: Yes   Other Topics Concern  . Not on file   Social History Narrative  . No narrative on file    Family History  Problem Relation Age of Onset  . Lung cancer Father   . Prostate cancer Father   . Diabetes Maternal Grandfather   . Colon polyps Other   . Hypertension Mother   . COPD Mother     Current Outpatient Prescriptions  Medication Sig Dispense Refill  . ALPRAZolam (XANAX) 0.5 MG tablet Take 0.25 mg by mouth 3 (three) times daily as needed for anxiety.       Marland Kitchen amoxicillin-clavulanate (AUGMENTIN) 875-125 MG per tablet Take 1 tablet by mouth 2 (two) times daily.  14 tablet  1  . aspirin EC 81 MG tablet Take 81 mg by mouth at bedtime.       Marland Kitchen b complex vitamins capsule Take 1 capsule by mouth at bedtime.       . cetirizine (ZYRTEC) 10 MG tablet Take 5 mg by mouth at bedtime.       Marland Kitchen desvenlafaxine (PRISTIQ) 100 MG 24 hr tablet Take 200 mg by mouth every morning.       . Lactobacillus (PROBIOTIC ACIDOPHILUS PO) Take 1 capsule by mouth every morning.      . methocarbamol (ROBAXIN) 750 MG tablet Take 1 tablet (750 mg total) by mouth 4 (four) times daily as needed (use for muscle cramps/pain).  30 tablet  2  . naproxen (NAPROSYN) 500 MG tablet Take 1 tablet (500 mg total) by mouth 2 (two) times daily with a meal.  40 tablet  1  . Pancrelipase, Lip-Prot-Amyl, (CREON) 36000 UNITS CPEP Take 36,000 Units by mouth. TAKE ONE PILL WITH EACH MEAL      . propranolol (INDERAL) 10 MG tablet Take 1 tablet (10 mg total) by mouth every morning.  30 tablet  1  . zolpidem (AMBIEN) 10 MG tablet Take 5 mg by mouth at bedtime as needed for sleep.       No current facility-administered medications for this visit.     Allergies  Allergen Reactions  . Anesthetics,  Amide Other (See Comments)    System shut down when 53 years old having jaw work done  . Succinylcholine Chloride Other (See Comments)    Major organs stopped working    BP 122/68  Pulse 73  Temp(Src) 98 F (36.7 C)  Ht 5\' 5"  (1.651 m)  Wt 134 lb (60.782 kg)  BMI 22.30 kg/m2  LMP 07/16/2013  No results found.   Review of Systems  Constitutional: Negative for fever, chills and diaphoresis.  HENT: Negative for ear pain, sore throat and trouble swallowing.   Eyes: Negative for photophobia and visual disturbance.  Respiratory: Negative for cough and choking.   Cardiovascular:  Negative for chest pain and palpitations.  Gastrointestinal: Negative for nausea, vomiting, abdominal pain, diarrhea, constipation, anal bleeding and rectal pain.  Genitourinary: Negative for dysuria, frequency and difficulty urinating.  Musculoskeletal: Negative for gait problem and myalgias.  Skin: Negative for color change, pallor and rash.  Neurological: Negative for dizziness, speech difficulty, weakness and numbness.  Hematological: Negative for adenopathy.  Psychiatric/Behavioral: Negative for confusion and agitation. The patient is not nervous/anxious.        Objective:   Physical Exam  Constitutional: She is oriented to person, place, and time. She appears well-developed and well-nourished. No distress.  HENT:  Head: Normocephalic.  Mouth/Throat: Oropharynx is clear and moist. No oropharyngeal exudate.  Eyes: Conjunctivae and EOM are normal. Pupils are equal, round, and reactive to light. No scleral icterus.  Neck: Normal range of motion. No tracheal deviation present.  Cardiovascular: Normal rate and intact distal pulses.   Pulmonary/Chest: Effort normal. No respiratory distress. She exhibits no tenderness.  Abdominal: Soft. She exhibits no distension. There is tenderness in the right lower quadrant. There is no rigidity, no rebound, no guarding, no tenderness at McBurney's point and negative  Murphy's sign. No hernia. Hernia confirmed negative in the ventral area, confirmed negative in the right inguinal area and confirmed negative in the left inguinal area.    Incisions clean with normal healing ridges.  No hernias  Genitourinary: No vaginal discharge found.  Musculoskeletal: Normal range of motion. She exhibits no tenderness.  Lymphadenopathy:       Right: No inguinal adenopathy present.       Left: No inguinal adenopathy present.  Neurological: She is alert and oriented to person, place, and time. No cranial nerve deficit. She exhibits normal muscle tone. Coordination normal.  Skin: Skin is warm and dry. No rash noted. She is not diaphoretic.  Psychiatric: She has a normal mood and affect. Her behavior is normal.       Assessment:     Gradually recovering status post robotically assisted colectomy for left-sided diverticulitis and chronic abscess/fistula.  Persistent right lower quadrant pain.  Most likely musculoskeletal & milder.     Plan:     Increase activity as tolerated to regular activity.  Low impact exercise such as walking an hour a day at least ideal.  Do not push through pain.  Okay to swim/bathe.  Strongly recommend she get walk  I did not have to renew any pain medications this time around.  Continue naproxen and heat.  Diet as tolerated.  Low fat high fiber diet ideal.  Bowel regimen with 30 g fiber a day and fiber supplement as needed to avoid problems.  Return to clinic 4 weeks, sooner as needed.   If she is feeling great, she can cancel  I again went over the pathophysiology of diverticulosis/diverticulitis, abscess formation, & the reasoning for surgery and all her interventions.  In many questions.  I tried to answer them.  I think she had some comprehension.   Instructions discussed.  Followup with primary care physician for other health issues as would normally be done.  Consider screening for malignancies (breast, prostate, colon, melanoma, etc) as  appropriate.  STOP SMOKING!  She is tired of me telling her this.  I noted when she keeps asking me if there is something she can do, smoking sensation is the most important thing in her recovery and avoiding problems.  I stressed to her the reason she still struggled assistant smoking and swelling or recovery.We talked to the  patient about the dangers of smoking.  We stressed that tobacco use dramatically increases the risk of peri-operative complications such as infection, tissue necrosis leaving to problems with incision/wound and organ healing, hernia, chronic pain, heart attack, stroke, DVT, pulmonary embolism, and death.  We noted there are programs in our community to help stop smoking.  Information was available.  Questions answered.  The patient expressed understanding and appreciation

## 2013-09-09 ENCOUNTER — Telehealth (INDEPENDENT_AMBULATORY_CARE_PROVIDER_SITE_OTHER): Payer: Self-pay

## 2013-09-09 NOTE — Telephone Encounter (Signed)
Pt s/p lap colectomy on 07/16/13 by Dr Johney Maine. Pt is calling today with c/o nausea, pain on her left and right side and diarrhea. Pt states that this all started last night. Pt states that she has not been eating much due to the increased nausea. Pt denies any fevers or chills. She was just worried because she had not been having any issues until last night. Pt states that she has taken aleve for pain with some relief. Advised pt to eat bland foods at this time. Will send Dr Johney Maine a message with her concerns and any further recommendations. Pt verbalized understanding and agrees with POC.

## 2013-09-10 NOTE — Telephone Encounter (Signed)
Liquids x2 days.  OK to call in Zofran or phenergan.  Should improve

## 2013-09-11 ENCOUNTER — Other Ambulatory Visit (INDEPENDENT_AMBULATORY_CARE_PROVIDER_SITE_OTHER): Payer: Self-pay

## 2013-09-11 NOTE — Telephone Encounter (Signed)
Called pt to inform him of Dr Clyda Greener recommendations. Pt states that she feels better today. She believes that it was something that she had eaten. Pt states that she does not want the Zofran rx at this time. Pt states that she will call us back if she has another episode and wants [place] to call the Zofran rx in.

## 2013-09-22 ENCOUNTER — Other Ambulatory Visit: Payer: Self-pay | Admitting: Internal Medicine

## 2013-09-30 ENCOUNTER — Encounter (INDEPENDENT_AMBULATORY_CARE_PROVIDER_SITE_OTHER): Payer: Medicaid Other | Admitting: Surgery

## 2013-10-05 ENCOUNTER — Encounter (INDEPENDENT_AMBULATORY_CARE_PROVIDER_SITE_OTHER): Payer: Medicaid Other | Admitting: Surgery

## 2013-10-20 ENCOUNTER — Encounter (INDEPENDENT_AMBULATORY_CARE_PROVIDER_SITE_OTHER): Payer: Medicaid Other | Admitting: Surgery

## 2013-11-23 ENCOUNTER — Telehealth: Payer: Self-pay | Admitting: Internal Medicine

## 2013-11-23 NOTE — Telephone Encounter (Signed)
Left message on her home # that [employer] assistance form came today.  She needs to fill out her part and then we can do our part.

## 2013-11-24 NOTE — Telephone Encounter (Signed)
Spoke with patient and she said she would see if her mom would pay for an rx of creon if you could send that in until she can get the abbvie assistance.  She is going to try to get by tomorrow to sign the forms.

## 2013-11-24 NOTE — Telephone Encounter (Signed)
Please advise what we can do till she gets assistance Sir.

## 2013-11-25 MED ORDER — PANCRELIPASE (LIP-PROT-AMYL) 36000-114000 UNITS PO CPEP: 36000.0000 [IU] | ORAL_CAPSULE | Freq: Three times a day (TID) | ORAL | Status: DC

## 2013-11-25 NOTE — Telephone Encounter (Signed)
Richardson Landry our creon rep came by our office today and provided me with a coupon so that Tracey Fields can get 100 creon free.  I called the pharmacy and informed them.  They said I don't need to send a written rx that they can use the rx I e-scribed today.  They are having to order the creon and it should be in tomorrow.  I informed Pia about the coupon and she was happy to hear this information, she said she will be up today or tomorrow to pick it up and sign papers.

## 2013-11-25 NOTE — Telephone Encounter (Signed)
Left detailed message on patient's home # that rx has been sent in to [facility] since we do not carry samples of creon.

## 2013-11-25 NOTE — Telephone Encounter (Signed)
We can rx x 1 month but also see - is this one we have samples of?

## 2013-12-15 NOTE — Telephone Encounter (Signed)
Faxed the completed form to [employer] patient assistance foundation for Aleli to hopefully be able to get creon.  Fax # [phone].

## 2013-12-30 ENCOUNTER — Other Ambulatory Visit: Payer: Self-pay | Admitting: Gynecology

## 2013-12-30 DIAGNOSIS — Z1231 Encounter for screening mammogram for malignant neoplasm of breast: Secondary | ICD-10-CM

## 2014-01-08 ENCOUNTER — Telehealth: Payer: Self-pay | Admitting: Internal Medicine

## 2014-01-08 MED ORDER — PANCRELIPASE (LIP-PROT-AMYL) 36000-114000 UNITS PO CPEP: 36000.0000 [IU] | ORAL_CAPSULE | Freq: Three times a day (TID) | ORAL | Status: DC

## 2014-01-08 NOTE — Telephone Encounter (Signed)
Spoke with patient and confirmed pharmacy and how many months supply she would like to get at a time (three).

## 2014-01-12 ENCOUNTER — Other Ambulatory Visit: Payer: Self-pay | Admitting: Internal Medicine

## 2014-01-12 NOTE — Telephone Encounter (Signed)
I do not see this on current medication list. Okay for refill?

## 2014-01-14 ENCOUNTER — Ambulatory Visit (HOSPITAL_COMMUNITY): Payer: Medicaid Other

## 2014-01-20 ENCOUNTER — Other Ambulatory Visit (INDEPENDENT_AMBULATORY_CARE_PROVIDER_SITE_OTHER): Payer: Self-pay | Admitting: *Deleted

## 2014-01-20 DIAGNOSIS — R1031 Right lower quadrant pain: Secondary | ICD-10-CM

## 2014-01-21 ENCOUNTER — Inpatient Hospital Stay: Admission: RE | Admit: 2014-01-21 | Payer: Medicaid Other | Source: Ambulatory Visit

## 2014-01-22 ENCOUNTER — Ambulatory Visit (HOSPITAL_COMMUNITY)
Admission: RE | Admit: 2014-01-22 | Discharge: 2014-01-22 | Disposition: A | Discharge status: Home / Self Care | Payer: Medicaid Other | Source: Ambulatory Visit | Attending: Gynecology | Admitting: Gynecology

## 2014-01-22 DIAGNOSIS — Z1231 Encounter for screening mammogram for malignant neoplasm of breast: Secondary | ICD-10-CM

## 2014-02-02 ENCOUNTER — Ambulatory Visit
Admission: RE | Admit: 2014-02-02 | Discharge: 2014-02-02 | Disposition: A | Discharge status: Home / Self Care | Payer: Medicaid Other | Source: Ambulatory Visit | Attending: Surgery | Admitting: Surgery

## 2014-02-02 MED ORDER — IOHEXOL 300 MG/ML  SOLN
100.0000 mL | Freq: Once | INTRAMUSCULAR | Status: AC | PRN
  Administered 2014-02-02: 100 mL via INTRAVENOUS

## 2014-02-03 NOTE — Progress Notes (Signed)
Patient called and informed of the information below.  Patient is feeling fine but still has the intermittent discomfort in her abdomen.  Patient will go this week to have her lab work done.

## 2014-03-30 ENCOUNTER — Ambulatory Visit: Payer: Medicaid Other | Admitting: Internal Medicine

## 2014-04-14 ENCOUNTER — Ambulatory Visit: Payer: Medicaid Other | Admitting: Internal Medicine

## 2014-06-28 ENCOUNTER — Other Ambulatory Visit: Payer: Self-pay | Admitting: Surgery

## 2014-06-28 NOTE — H&P (Signed)
Tracey Fields 06/28/2014 1:37 PM Location: [facility] Patient #: [id number] DOB: Dec 15, 1960 Divorced / Language: Tracey Fields / Race: White Female History of Present Illness Tracey Hector MD; 06/28/2014 2:12 PM) Patient words: hernia.  The patient is a 54 year old female who presents with abdominal swelling. Patient comes in today with worsening upper abdominal swelling. Concern for hernia. Allergies Tracey Fields, [name]; 06/28/2014 1:37 PM) Anesthetics, Amide Succinylcholine Chloride *CHEMICALS*  Medication History Tracey Fields, [name]; 06/28/2014 1:38 PM) ALPRAZolam (0.5MG  Tablet, Oral as needed) Active. Doxepin HCl (10MG  Capsule, Oral every other day) Active. Methocarbamol (750MG  Tablet, Oral daily) Active. Mirtazapine (15MG  Tablet, Oral daily) Active. Monoject Flush Syringe (0.9% Solution, Intravenous as needed) Active. Propranolol HCl (10MG  Tablet, Oral daily) Active. MetroNIDAZOLE (500MG  Tablet, Oral every other day) Active. Medications Reconciled    Vitals Tracey Fields [name]; 06/28/2014 1:39 PM) 06/28/2014 1:38 PM Weight: 146 lb Height: 65in Body Surface Area: 1.74 m Body Mass Index: 24.3 kg/m Temp.: 99.49F(Oral)  Pulse: 75 (Regular)  Resp.: 17 (Unlabored)  BP: 130/70 (Sitting, Left Arm, Standard)     Physical Exam Tracey Hector MD; 06/28/2014 3:20 PM)  General Mental Status-Alert. General Appearance-Not in acute distress. Voice-Normal.  Integumentary Global Assessment Upon inspection and palpation of skin surfaces of the - Distribution of scalp and body hair is normal. General Characteristics Overall examination of the patient's skin reveals - no rashes and no suspicious lesions.  Head and Neck Head-normocephalic, atraumatic with no lesions or palpable masses. Face Global Assessment - atraumatic, no absence of expression. Neck Global Assessment - no abnormal movements, no decreased range of  motion. Trachea-midline. Thyroid Gland Characteristics - non-tender.  Eye Eyeball - Left-Extraocular movements intact, No Nystagmus. Eyeball - Right-Extraocular movements intact, No Nystagmus. Upper Eyelid - Left-No Cyanotic. Upper Eyelid - Right-No Cyanotic.  Chest and Lung Exam Inspection Accessory muscles - No use of accessory muscles in breathing.  Abdomen Note: Overweight but soft. Bulging and supraumbilical and umbilical regions concerning for upper abdominal ventral hernias. Sensitive. Not near any incisions. Pfannenstiel incision well-healed. No abdominal pain.   Peripheral Vascular Upper Extremity Inspection - Left - Not Gangrenous, No Petechiae. Right - Not Gangrenous, No Petechiae.  Neurologic Neurologic evaluation reveals -normal attention span and ability to concentrate, able to name objects and repeat phrases. Appropriate fund of knowledge and normal coordination.  Neuropsychiatric Mental status exam performed with findings of-able to articulate well with normal speech/language, rate, volume and coherence and no evidence of hallucinations, delusions, obsessions or homicidal/suicidal ideation. Orientation-oriented X3.  Musculoskeletal Global Assessment Gait and Station - normal gait and station.  Lymphatic General Lymphatics Description - No Generalized lymphadenopathy.    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 The anatomy & physiology of the abdominal wall was discussed. The pathophysiology of hernias was discussed. Natural history risks without surgery including progeressive enlargement, pain, incarceration, & strangulation was discussed. Contributors to complications such as smoking, obesity, diabetes, prior surgery, etc were discussed.  I  feel the risks of no intervention will lead to serious problems that outweigh the operative risks; therefore, I recommended surgery to reduce and repair the hernia. I explained laparoscopic techniques with possible need for an open approach. I noted the probable use of mesh to patch and/or buttress the hernia repair  Risks such as bleeding, infection, abscess, need for further treatment, heart attack, death,  and other risks were discussed. I noted a good likelihood this will help address the problem. Goals of post-operative recovery were discussed as well. Possibility that this will not correct all symptoms was explained. I stressed the importance of low-impact activity, aggressive pain control, avoiding constipation, & not pushing through pain to minimize risk of post-operative chronic pain or injury. Possibility of reherniation especially with smoking, obesity, diabetes, immunosuppression, and other health conditions was discussed. We will work to minimize complications.  An educational handout further explaining the pathology & treatment options was given as well. Questions were answered. The patient expresses understanding & wishes to proceed with surgery.  Pt Education - [facility] Hernia Post-Op HCI (Tracey Fields): discussed with patient and provided information. Pt Education - [facility] Pain Control (Tracey Fields) Pt Education - [facility] Good Bowel Health (Tracey Fields) Pt Education - [facility] Free Text Education/Instructions: discussed with patient and provided information.  Tracey Fields, M.D., F.A.C.S. Gastrointestinal and Minimally Invasive Surgery [facility] [address], [place], [place] [address] [phone] Main / Paging

## 2014-07-19 ENCOUNTER — Encounter (HOSPITAL_COMMUNITY): Payer: Self-pay

## 2014-07-19 ENCOUNTER — Encounter (HOSPITAL_COMMUNITY)
Admission: RE | Admit: 2014-07-19 | Discharge: 2014-07-19 | Disposition: A | Discharge status: Home / Self Care | Payer: Medicaid Other | Source: Ambulatory Visit | Attending: Surgery | Admitting: Surgery

## 2014-07-19 DIAGNOSIS — Z01810 Encounter for preprocedural cardiovascular examination: Secondary | ICD-10-CM | POA: Diagnosis not present

## 2014-07-19 DIAGNOSIS — K439 Ventral hernia without obstruction or gangrene: Secondary | ICD-10-CM | POA: Diagnosis not present

## 2014-07-19 DIAGNOSIS — Z01812 Encounter for preprocedural laboratory examination: Secondary | ICD-10-CM | POA: Insufficient documentation

## 2014-07-19 HISTORY — DX: Pain, unspecified: R52

## 2014-07-19 LAB — BASIC METABOLIC PANEL
Anion gap: 11 (ref 5–15)
BUN: 19 mg/dL (ref 6–20)
CO2: 28 mmol/L (ref 22–32)
CREATININE: 0.78 mg/dL (ref 0.44–1.00)
Calcium: 9.7 mg/dL (ref 8.9–10.3)
Chloride: 104 mmol/L (ref 101–111)
GFR calc Af Amer: >60 mL/min (ref >60)
GFR calc non Af Amer: >60 mL/min (ref >60)
GLUCOSE: 106 mg/dL — AB (ref 65–99)
Potassium: 4.4 mmol/L (ref 3.5–5.1)
Sodium: 143 mmol/L (ref 135–145)

## 2014-07-19 LAB — HCG, SERUM, QUALITATIVE: Preg, Serum: NEGATIVE

## 2014-07-19 LAB — CBC
HCT: 42.6 % (ref 36.0–46.0)
Hemoglobin: 14.2 g/dL (ref 12.0–15.0)
MCH: 30.9 pg (ref 26.0–34.0)
MCHC: 33.3 g/dL (ref 30.0–36.0)
MCV: 92.8 fL (ref 78.0–100.0)
PLATELETS: 300 10*3/uL (ref 150–400)
RBC: 4.59 MIL/uL (ref 3.87–5.11)
RDW: 13.4 % (ref 11.5–15.5)
WBC: 7.1 10*3/uL (ref 4.0–10.5)

## 2014-07-19 NOTE — Patient Instructions (Signed)
[place]  07/19/2014   Your procedure is scheduled on:  6-16 -2016Thursday  Enter through [facility]  Entrance and follow signs to [facility]. Arrive at     1100   AM .  (Limit 1 person with you).  Call this number if you have problems the morning of surgery: [phone]  Or Presurgical Testing [phone].   For Living Will and/or Health Care Power Attorney Forms: please provide copy for your medical record,may bring AM of surgery(Forms should be already notarized -we do not provide this service).(No information preferred today).  Remember: Follow any bowel prep instructions per MD office.    Do not eat food/ or drink: After Midnight.  Exception: may have clear liquids:up to 6 Hours before arrival. Nothing after: 0700 AM  Clear liquids include soda, tea, black coffee, apple or grape juice, broth.  Take these medicines the morning of surgery with A SIP OF WATER: Zyrtec. Pristiq. Propranolol.   Do not wear jewelry, make-up or nail polish.  Do not wear deodorant, lotions, powders, or perfumes.   Do not shave legs and under arms- 48 hours(2 days) prior to first CHG shower.(Shaving face and neck okay.)  Do not bring valuables to the hospital.(Hospital is not responsible for lost valuables).  Contacts, dentures or removable bridgework, body piercing, hair pins may not be worn into surgery.  Leave suitcase in the car. After surgery it may be brought to your room.  For patients admitted to the hospital, checkout time is 11:00 AM the day of discharge.(Restricted visitors-Any Persons displaying flu-like symptoms or illness).    Patients discharged the day of surgery will not be allowed to drive home. Must have responsible person with you x 24 hours once discharged.  Name and phone number of your driver: will arrange     Please read over the following fact sheets that you were given:  CHG(Chlorhexidine Gluconate 4% Surgical Soap) use..  Remember : Type/Screen "Blue  armbands" - may not be removed once applied(would result in being retested AM of surgery, if removed).         [facility] - Preparing for Surgery Before surgery, you can play an important role.  Because skin is not sterile, your skin needs to be as free of germs as possible.  You can reduce the number of germs on your skin by washing with CHG (chlorahexidine gluconate) soap before surgery.  CHG is an antiseptic cleaner which kills germs and bonds with the skin to continue killing germs even after washing. Please DO NOT use if you have an allergy to CHG or antibacterial soaps.  If your skin becomes reddened/irritated stop using the CHG and inform your nurse when you arrive at Short Stay. Do not shave (including legs and underarms) for at least 48 hours prior to the first CHG shower.  You may shave your face/neck. Please follow these instructions carefully:  1.  Shower with CHG Soap the night before surgery and the  morning of Surgery.  2.  If you choose to wash your hair, wash your hair first as usual with your  normal  shampoo.  3.  After you shampoo, rinse your hair and body thoroughly to remove the  shampoo.                           4.  Use CHG as you would any other liquid soap.  You can apply chg directly  to the  skin and wash                       Gently with a scrungie or clean washcloth.  5.  Apply the CHG Soap to your body ONLY FROM THE NECK DOWN.   Do not use on face/ open                           Wound or open sores. Avoid contact with eyes, ears mouth and genitals (private parts).                       Wash face,  Genitals (private parts) with your normal soap.             6.  Wash thoroughly, paying special attention to the area where your surgery  will be performed.  7.  Thoroughly rinse your body with warm water from the neck down.  8.  DO NOT shower/wash with your normal soap after using and rinsing off  the CHG Soap.                9.  Pat yourself dry with a clean towel.             10.  Wear clean pajamas.            11.  Place clean sheets on your bed the night of your first shower and do not  sleep with pets. Day of Surgery : Do not apply any lotions/deodorants the morning of surgery.  Please wear clean clothes to the hospital/surgery center.  FAILURE TO FOLLOW THESE INSTRUCTIONS MAY RESULT IN THE CANCELLATION OF YOUR SURGERY PATIENT SIGNATURE_________________________________  NURSE SIGNATURE__________________________________  ________________________________________________________________________

## 2014-07-19 NOTE — Pre-Procedure Instructions (Signed)
07-19-14 EKG done today. Dr. Stevphen Rochester, anesthesiologist made aware of pts history Malignant hyperthermia- will see AM of preop.

## 2014-07-22 ENCOUNTER — Observation Stay (HOSPITAL_COMMUNITY)
Admission: RE | Admit: 2014-07-22 | Discharge: 2014-07-23 | Disposition: A | Discharge status: Home / Self Care | Payer: Medicaid Other | Source: Ambulatory Visit | Attending: Surgery | Admitting: Surgery

## 2014-07-22 ENCOUNTER — Ambulatory Visit (HOSPITAL_COMMUNITY): Payer: Medicaid Other | Admitting: Anesthesiology

## 2014-07-22 ENCOUNTER — Encounter (HOSPITAL_COMMUNITY): Payer: Self-pay | Admitting: *Deleted

## 2014-07-22 ENCOUNTER — Encounter (HOSPITAL_COMMUNITY)
Admission: RE | Disposition: A | Discharge status: Home / Self Care | Payer: Medicaid Other | Source: Ambulatory Visit | Attending: Surgery

## 2014-07-22 DIAGNOSIS — Z888 Allergy status to other drugs, medicaments and biological substances status: Secondary | ICD-10-CM | POA: Insufficient documentation

## 2014-07-22 DIAGNOSIS — I341 Nonrheumatic mitral (valve) prolapse: Secondary | ICD-10-CM | POA: Insufficient documentation

## 2014-07-22 DIAGNOSIS — K439 Ventral hernia without obstruction or gangrene: Principal | ICD-10-CM | POA: Insufficient documentation

## 2014-07-22 DIAGNOSIS — Z79899 Other long term (current) drug therapy: Secondary | ICD-10-CM | POA: Insufficient documentation

## 2014-07-22 DIAGNOSIS — J449 Chronic obstructive pulmonary disease, unspecified: Secondary | ICD-10-CM | POA: Insufficient documentation

## 2014-07-22 DIAGNOSIS — K429 Umbilical hernia without obstruction or gangrene: Secondary | ICD-10-CM

## 2014-07-22 DIAGNOSIS — Z7982 Long term (current) use of aspirin: Secondary | ICD-10-CM | POA: Diagnosis not present

## 2014-07-22 DIAGNOSIS — Z9049 Acquired absence of other specified parts of digestive tract: Secondary | ICD-10-CM | POA: Insufficient documentation

## 2014-07-22 DIAGNOSIS — F17210 Nicotine dependence, cigarettes, uncomplicated: Secondary | ICD-10-CM | POA: Diagnosis not present

## 2014-07-22 DIAGNOSIS — I10 Essential (primary) hypertension: Secondary | ICD-10-CM | POA: Insufficient documentation

## 2014-07-22 DIAGNOSIS — K469 Unspecified abdominal hernia without obstruction or gangrene: Secondary | ICD-10-CM | POA: Diagnosis not present

## 2014-07-22 DIAGNOSIS — K861 Other chronic pancreatitis: Secondary | ICD-10-CM | POA: Diagnosis not present

## 2014-07-22 DIAGNOSIS — R1031 Right lower quadrant pain: Secondary | ICD-10-CM

## 2014-07-22 DIAGNOSIS — Z85828 Personal history of other malignant neoplasm of skin: Secondary | ICD-10-CM | POA: Insufficient documentation

## 2014-07-22 DIAGNOSIS — K5732 Diverticulitis of large intestine without perforation or abscess without bleeding: Secondary | ICD-10-CM

## 2014-07-22 DIAGNOSIS — Z884 Allergy status to anesthetic agent status: Secondary | ICD-10-CM | POA: Insufficient documentation

## 2014-07-22 DIAGNOSIS — K458 Other specified abdominal hernia without obstruction or gangrene: Secondary | ICD-10-CM

## 2014-07-22 HISTORY — PX: LAPAROSCOPIC ASSISTED VENTRAL HERNIA REPAIR: SHX6312

## 2014-07-22 HISTORY — PX: INSERTION OF MESH: SHX5868

## 2014-07-22 SURGERY — REPAIR, HERNIA, VENTRAL, LAPAROSCOPY-ASSISTED
Anesthesia: General | Site: Abdomen

## 2014-07-22 MED ORDER — LACTATED RINGERS IV SOLN
INTRAVENOUS | Status: DC
  Administered 2014-07-22: 1000 mL via INTRAVENOUS
  Administered 2014-07-22 (×2): via INTRAVENOUS

## 2014-07-22 MED ORDER — OXYCODONE HCL 5 MG PO TABS
5.0000 mg | ORAL_TABLET | ORAL | Status: DC | PRN
  Administered 2014-07-23 (×3): 10 mg via ORAL
  Filled 2014-07-22 (×3): qty 2
  Filled 2014-07-22: qty 1

## 2014-07-22 MED ORDER — MAGIC MOUTHWASH
15.0000 mL | Freq: Four times a day (QID) | ORAL | Status: DC | PRN
  Filled 2014-07-22: qty 15

## 2014-07-22 MED ORDER — MIDAZOLAM HCL 2 MG/2ML IJ SOLN
INTRAMUSCULAR | Status: AC
  Filled 2014-07-22: qty 2

## 2014-07-22 MED ORDER — 0.9 % SODIUM CHLORIDE (POUR BTL) OPTIME
TOPICAL | Status: DC | PRN
  Administered 2014-07-22: 1000 mL

## 2014-07-22 MED ORDER — ONDANSETRON HCL 4 MG/2ML IJ SOLN
INTRAMUSCULAR | Status: DC | PRN
  Administered 2014-07-22: 4 mg via INTRAVENOUS

## 2014-07-22 MED ORDER — METOPROLOL TARTRATE 12.5 MG HALF TABLET
12.5000 mg | ORAL_TABLET | Freq: Two times a day (BID) | ORAL | Status: DC | PRN
  Filled 2014-07-22: qty 1

## 2014-07-22 MED ORDER — SODIUM CHLORIDE 0.9 % IJ SOLN: 3.0000 mL | Freq: Two times a day (BID) | INTRAMUSCULAR | Status: DC

## 2014-07-22 MED ORDER — DESVENLAFAXINE SUCCINATE ER 100 MG PO TB24
200.0000 mg | ORAL_TABLET | Freq: Every morning | ORAL | Status: DC
  Administered 2014-07-23: 200 mg via ORAL
  Filled 2014-07-22: qty 2

## 2014-07-22 MED ORDER — GLYCOPYRROLATE 0.2 MG/ML IJ SOLN
INTRAMUSCULAR | Status: AC
  Filled 2014-07-22: qty 1

## 2014-07-22 MED ORDER — ESMOLOL HCL 10 MG/ML IV SOLN
INTRAVENOUS | Status: AC
  Filled 2014-07-22: qty 10

## 2014-07-22 MED ORDER — HYDROMORPHONE HCL 2 MG/ML IJ SOLN
INTRAMUSCULAR | Status: AC
  Filled 2014-07-22: qty 1

## 2014-07-22 MED ORDER — CEFAZOLIN SODIUM-DEXTROSE 2-3 GM-% IV SOLR
2.0000 g | INTRAVENOUS | Status: AC
  Administered 2014-07-22: 2 g via INTRAVENOUS

## 2014-07-22 MED ORDER — GLYCOPYRROLATE 0.2 MG/ML IJ SOLN
INTRAMUSCULAR | Status: DC | PRN
  Administered 2014-07-22: 0.6 mg via INTRAVENOUS

## 2014-07-22 MED ORDER — BUPIVACAINE-EPINEPHRINE 0.25% -1:200000 IJ SOLN
INTRAMUSCULAR | Status: AC
  Filled 2014-07-22: qty 1

## 2014-07-22 MED ORDER — IBUPROFEN 200 MG PO TABS: 400.0000 mg | ORAL_TABLET | Freq: Four times a day (QID) | ORAL | Status: DC | PRN

## 2014-07-22 MED ORDER — MIDAZOLAM HCL 5 MG/5ML IJ SOLN
INTRAMUSCULAR | Status: DC | PRN
  Administered 2014-07-22: 2 mg via INTRAVENOUS

## 2014-07-22 MED ORDER — LABETALOL HCL 5 MG/ML IV SOLN
INTRAVENOUS | Status: AC
  Filled 2014-07-22: qty 4

## 2014-07-22 MED ORDER — LIDOCAINE HCL (CARDIAC) 20 MG/ML IV SOLN
INTRAVENOUS | Status: DC | PRN
  Administered 2014-07-22: 100 mg via INTRAVENOUS

## 2014-07-22 MED ORDER — ALPRAZOLAM 0.25 MG PO TABS
0.2500 mg | ORAL_TABLET | Freq: Three times a day (TID) | ORAL | Status: DC | PRN
  Administered 2014-07-22 – 2014-07-23 (×2): 0.25 mg via ORAL
  Filled 2014-07-22 (×3): qty 1

## 2014-07-22 MED ORDER — CHLORHEXIDINE GLUCONATE 4 % EX LIQD: 1.0000 "application " | Freq: Once | CUTANEOUS | Status: DC

## 2014-07-22 MED ORDER — FAMOTIDINE 20 MG PO TABS
20.0000 mg | ORAL_TABLET | Freq: Every day | ORAL | Status: DC
  Administered 2014-07-22: 20 mg via ORAL
  Filled 2014-07-22 (×3): qty 1

## 2014-07-22 MED ORDER — DIPHENHYDRAMINE HCL 50 MG/ML IJ SOLN
INTRAMUSCULAR | Status: AC
  Filled 2014-07-22: qty 1

## 2014-07-22 MED ORDER — HYDROMORPHONE HCL 1 MG/ML IJ SOLN: 0.5000 mg | INTRAMUSCULAR | Status: DC | PRN

## 2014-07-22 MED ORDER — CEFAZOLIN SODIUM-DEXTROSE 2-3 GM-% IV SOLR: 2.0000 g | Freq: Three times a day (TID) | INTRAVENOUS | Status: DC

## 2014-07-22 MED ORDER — LABETALOL HCL 5 MG/ML IV SOLN
INTRAVENOUS | Status: DC | PRN
  Administered 2014-07-22: 5 mg via INTRAVENOUS
  Administered 2014-07-22: 2.5 mg via INTRAVENOUS

## 2014-07-22 MED ORDER — DIPHENHYDRAMINE HCL 50 MG/ML IJ SOLN
12.5000 mg | Freq: Four times a day (QID) | INTRAMUSCULAR | Status: DC | PRN
  Administered 2014-07-22: 12.5 mg via INTRAVENOUS

## 2014-07-22 MED ORDER — KETOROLAC TROMETHAMINE 30 MG/ML IJ SOLN
INTRAMUSCULAR | Status: AC
  Filled 2014-07-22: qty 1

## 2014-07-22 MED ORDER — PROMETHAZINE HCL 25 MG/ML IJ SOLN: 6.2500 mg | INTRAMUSCULAR | Status: DC | PRN

## 2014-07-22 MED ORDER — GLYCOPYRROLATE 0.2 MG/ML IJ SOLN
INTRAMUSCULAR | Status: AC
  Filled 2014-07-22: qty 3

## 2014-07-22 MED ORDER — DIPHENHYDRAMINE HCL 50 MG/ML IJ SOLN: 12.5000 mg | Freq: Four times a day (QID) | INTRAMUSCULAR | Status: DC | PRN

## 2014-07-22 MED ORDER — LACTATED RINGERS IV SOLN: INTRAVENOUS | Status: DC

## 2014-07-22 MED ORDER — PROPOFOL 10 MG/ML IV BOLUS
INTRAVENOUS | Status: AC
  Filled 2014-07-22: qty 20

## 2014-07-22 MED ORDER — ASPIRIN EC 81 MG PO TBEC
81.0000 mg | DELAYED_RELEASE_TABLET | Freq: Every day | ORAL | Status: DC
  Administered 2014-07-22: 81 mg via ORAL
  Filled 2014-07-22 (×2): qty 1

## 2014-07-22 MED ORDER — PROPOFOL 10 MG/ML IV BOLUS
INTRAVENOUS | Status: DC | PRN
  Administered 2014-07-22: 50 mg via INTRAVENOUS
  Administered 2014-07-22: 150 mg via INTRAVENOUS

## 2014-07-22 MED ORDER — PROPRANOLOL HCL 10 MG PO TABS
10.0000 mg | ORAL_TABLET | Freq: Every morning | ORAL | Status: DC
  Administered 2014-07-23: 10 mg via ORAL
  Filled 2014-07-22: qty 1

## 2014-07-22 MED ORDER — ALUM & MAG HYDROXIDE-SIMETH 200-200-20 MG/5ML PO SUSP: 30.0000 mL | Freq: Four times a day (QID) | ORAL | Status: DC | PRN

## 2014-07-22 MED ORDER — HEPARIN SODIUM (PORCINE) 5000 UNIT/ML IJ SOLN
5000.0000 [IU] | Freq: Three times a day (TID) | INTRAMUSCULAR | Status: DC
  Administered 2014-07-22 – 2014-07-23 (×2): 5000 [IU] via SUBCUTANEOUS
  Filled 2014-07-22 (×5): qty 1

## 2014-07-22 MED ORDER — BUPIVACAINE LIPOSOME 1.3 % IJ SUSP
20.0000 mL | Freq: Once | INTRAMUSCULAR | Status: AC
  Administered 2014-07-22: 20 mL
  Filled 2014-07-22: qty 20

## 2014-07-22 MED ORDER — ONDANSETRON HCL 4 MG PO TABS: 4.0000 mg | ORAL_TABLET | Freq: Four times a day (QID) | ORAL | Status: DC | PRN

## 2014-07-22 MED ORDER — CEFAZOLIN SODIUM-DEXTROSE 2-3 GM-% IV SOLR
INTRAVENOUS | Status: AC
  Filled 2014-07-22: qty 50

## 2014-07-22 MED ORDER — ROCURONIUM BROMIDE 100 MG/10ML IV SOLN
INTRAVENOUS | Status: DC | PRN
  Administered 2014-07-22: 50 mg via INTRAVENOUS
  Administered 2014-07-22 (×4): 10 mg via INTRAVENOUS

## 2014-07-22 MED ORDER — KETOROLAC TROMETHAMINE 30 MG/ML IJ SOLN
INTRAMUSCULAR | Status: DC | PRN
  Administered 2014-07-22: 30 mg via INTRAVENOUS

## 2014-07-22 MED ORDER — PANCRELIPASE (LIP-PROT-AMYL) 36000-114000 UNITS PO CPEP
36000.0000 [IU] | ORAL_CAPSULE | Freq: Three times a day (TID) | ORAL | Status: DC
  Administered 2014-07-23: 36000 [IU] via ORAL
  Filled 2014-07-22 (×4): qty 1

## 2014-07-22 MED ORDER — ONDANSETRON HCL 4 MG/2ML IJ SOLN
4.0000 mg | Freq: Four times a day (QID) | INTRAMUSCULAR | Status: DC | PRN
  Filled 2014-07-22: qty 2

## 2014-07-22 MED ORDER — OXYCODONE HCL 5 MG PO TABS: 5.0000 mg | ORAL_TABLET | ORAL | Status: DC | PRN

## 2014-07-22 MED ORDER — LIP MEDEX EX OINT
1.0000 "application " | TOPICAL_OINTMENT | Freq: Two times a day (BID) | CUTANEOUS | Status: DC
  Administered 2014-07-22: 1 via TOPICAL
  Filled 2014-07-22: qty 7

## 2014-07-22 MED ORDER — EPHEDRINE SULFATE 50 MG/ML IJ SOLN
INTRAMUSCULAR | Status: DC | PRN
  Administered 2014-07-22: 5 mg via INTRAVENOUS
  Administered 2014-07-22: 10 mg via INTRAVENOUS

## 2014-07-22 MED ORDER — ROCURONIUM BROMIDE 100 MG/10ML IV SOLN
INTRAVENOUS | Status: AC
  Filled 2014-07-22: qty 1

## 2014-07-22 MED ORDER — SODIUM CHLORIDE 0.9 % IV SOLN: 250.0000 mL | INTRAVENOUS | Status: DC | PRN

## 2014-07-22 MED ORDER — MENTHOL 3 MG MT LOZG: 1.0000 | LOZENGE | OROMUCOSAL | Status: DC | PRN

## 2014-07-22 MED ORDER — SODIUM CHLORIDE 0.9 % IJ SOLN: 3.0000 mL | INTRAMUSCULAR | Status: DC | PRN

## 2014-07-22 MED ORDER — METOPROLOL TARTRATE 1 MG/ML IV SOLN
5.0000 mg | Freq: Four times a day (QID) | INTRAVENOUS | Status: DC | PRN
  Filled 2014-07-22: qty 5

## 2014-07-22 MED ORDER — LIDOCAINE HCL (CARDIAC) 20 MG/ML IV SOLN
INTRAVENOUS | Status: AC
  Filled 2014-07-22: qty 5

## 2014-07-22 MED ORDER — MORPHINE SULFATE 2 MG/ML IJ SOLN
2.0000 mg | INTRAMUSCULAR | Status: DC | PRN
  Administered 2014-07-22: 2 mg via INTRAVENOUS
  Filled 2014-07-22: qty 1

## 2014-07-22 MED ORDER — ARTIFICIAL TEARS OP OINT
TOPICAL_OINTMENT | OPHTHALMIC | Status: AC
  Filled 2014-07-22: qty 3.5

## 2014-07-22 MED ORDER — SODIUM CHLORIDE 0.9 % IJ SOLN
INTRAMUSCULAR | Status: AC
  Filled 2014-07-22: qty 100

## 2014-07-22 MED ORDER — ONDANSETRON HCL 4 MG/2ML IJ SOLN
INTRAMUSCULAR | Status: AC
  Filled 2014-07-22: qty 2

## 2014-07-22 MED ORDER — DEXAMETHASONE SODIUM PHOSPHATE 10 MG/ML IJ SOLN
INTRAMUSCULAR | Status: DC | PRN
  Administered 2014-07-22: 10 mg via INTRAVENOUS

## 2014-07-22 MED ORDER — ACETAMINOPHEN 500 MG PO TABS
1000.0000 mg | ORAL_TABLET | Freq: Three times a day (TID) | ORAL | Status: DC
  Administered 2014-07-22: 1000 mg via ORAL
  Filled 2014-07-22 (×3): qty 2

## 2014-07-22 MED ORDER — BUPIVACAINE-EPINEPHRINE 0.25% -1:200000 IJ SOLN
INTRAMUSCULAR | Status: DC | PRN
  Administered 2014-07-22: 50 mL

## 2014-07-22 MED ORDER — FENTANYL CITRATE (PF) 250 MCG/5ML IJ SOLN
INTRAMUSCULAR | Status: AC
  Filled 2014-07-22: qty 5

## 2014-07-22 MED ORDER — HYDROMORPHONE HCL 1 MG/ML IJ SOLN
INTRAMUSCULAR | Status: DC | PRN
  Administered 2014-07-22 (×2): 0.5 mg via INTRAVENOUS
  Administered 2014-07-22 (×2): 1 mg via INTRAVENOUS
  Administered 2014-07-22 (×2): 0.5 mg via INTRAVENOUS

## 2014-07-22 MED ORDER — DEXAMETHASONE SODIUM PHOSPHATE 10 MG/ML IJ SOLN
INTRAMUSCULAR | Status: AC
  Filled 2014-07-22: qty 1

## 2014-07-22 MED ORDER — CEFAZOLIN SODIUM 1-5 GM-% IV SOLN
1.0000 g | Freq: Four times a day (QID) | INTRAVENOUS | Status: AC
  Administered 2014-07-22 – 2014-07-23 (×3): 1 g via INTRAVENOUS
  Filled 2014-07-22 (×4): qty 50

## 2014-07-22 MED ORDER — MORPHINE SULFATE 2 MG/ML IJ SOLN
2.0000 mg | INTRAMUSCULAR | Status: DC | PRN
Start: 2014-07-22 — End: 2014-07-23
  Administered 2014-07-22: 2 mg via INTRAVENOUS
  Administered 2014-07-23 (×3): 4 mg via INTRAVENOUS
  Filled 2014-07-22 (×3): qty 2
  Filled 2014-07-22: qty 1

## 2014-07-22 MED ORDER — LACTATED RINGERS IV BOLUS (SEPSIS): 1000.0000 mL | Freq: Three times a day (TID) | INTRAVENOUS | Status: DC | PRN

## 2014-07-22 MED ORDER — LACTATED RINGERS IV SOLN
INTRAVENOUS | Status: DC
  Administered 2014-07-22: 50 via INTRAVENOUS
  Administered 2014-07-22: 18:00:00 via INTRAVENOUS

## 2014-07-22 MED ORDER — EPHEDRINE SULFATE 50 MG/ML IJ SOLN
INTRAMUSCULAR | Status: AC
  Filled 2014-07-22: qty 1

## 2014-07-22 MED ORDER — LORATADINE 10 MG PO TABS
10.0000 mg | ORAL_TABLET | Freq: Every day | ORAL | Status: DC
  Administered 2014-07-22: 10 mg via ORAL
  Filled 2014-07-22 (×2): qty 1

## 2014-07-22 MED ORDER — PHENOL 1.4 % MT LIQD: 2.0000 | OROMUCOSAL | Status: DC | PRN

## 2014-07-22 MED ORDER — FENTANYL CITRATE (PF) 100 MCG/2ML IJ SOLN
INTRAMUSCULAR | Status: DC | PRN
  Administered 2014-07-22 (×5): 50 ug via INTRAVENOUS

## 2014-07-22 MED ORDER — METHOCARBAMOL 500 MG PO TABS
750.0000 mg | ORAL_TABLET | Freq: Four times a day (QID) | ORAL | Status: DC | PRN
  Administered 2014-07-22: 750 mg via ORAL
  Filled 2014-07-22: qty 2

## 2014-07-22 MED ORDER — NEOSTIGMINE METHYLSULFATE 10 MG/10ML IV SOLN
INTRAVENOUS | Status: DC | PRN
  Administered 2014-07-22: 5 mg via INTRAVENOUS

## 2014-07-22 MED ORDER — SODIUM CHLORIDE 0.9 % IJ SOLN
INTRAMUSCULAR | Status: AC
  Filled 2014-07-22: qty 10

## 2014-07-22 MED ORDER — HYDROMORPHONE HCL 1 MG/ML IJ SOLN: 0.2500 mg | INTRAMUSCULAR | Status: DC | PRN

## 2014-07-22 MED ORDER — RISAQUAD PO CAPS
ORAL_CAPSULE | Freq: Every morning | ORAL | Status: DC
  Administered 2014-07-23: 1 via ORAL
  Filled 2014-07-22: qty 1

## 2014-07-22 SURGICAL SUPPLY — 45 items
APPLIER CLIP 5 13 M/L LIGAMAX5 (MISCELLANEOUS)
APR CLP MED LRG 5 ANG JAW (MISCELLANEOUS)
BINDER ABDOMINAL 12 ML 46-62 (SOFTGOODS) ×2 IMPLANT
CABLE HIGH FREQUENCY MONO STRZ (ELECTRODE) ×4 IMPLANT
CATH KIT ON-Q SILVERSOAK 7.5 (CATHETERS) IMPLANT
CATH KIT ON-Q SILVERSOAK 7.5IN (CATHETERS) IMPLANT
CHLORAPREP W/TINT 26ML (MISCELLANEOUS) ×4 IMPLANT
CLIP APPLIE 5 13 M/L LIGAMAX5 (MISCELLANEOUS) IMPLANT
CLOSURE WOUND 1/2 X4 (GAUZE/BANDAGES/DRESSINGS) ×1
DECANTER SPIKE VIAL GLASS SM (MISCELLANEOUS) ×4 IMPLANT
DEVICE SECURE STRAP 25 ABSORB (INSTRUMENTS) ×2 IMPLANT
DEVICE TROCAR PUNCTURE CLOSURE (ENDOMECHANICALS) ×4 IMPLANT
DRAPE LAPAROSCOPIC ABDOMINAL (DRAPES) ×4 IMPLANT
DRAPE WARM FLUID 44X44 (DRAPE) ×4 IMPLANT
DRSG TEGADERM 2-3/8X2-3/4 SM (GAUZE/BANDAGES/DRESSINGS) ×8 IMPLANT
DRSG TEGADERM 4X4.75 (GAUZE/BANDAGES/DRESSINGS) ×2 IMPLANT
ELECT REM PT RETURN 9FT ADLT (ELECTROSURGICAL) ×4
ELECTRODE REM PT RTRN 9FT ADLT (ELECTROSURGICAL) ×2 IMPLANT
GAUZE SPONGE 2X2 8PLY STRL LF (GAUZE/BANDAGES/DRESSINGS) IMPLANT
GLOVE ECLIPSE 8.0 STRL XLNG CF (GLOVE) ×4 IMPLANT
GLOVE INDICATOR 8.0 STRL GRN (GLOVE) ×4 IMPLANT
GOWN STRL REUS W/TWL XL LVL3 (GOWN DISPOSABLE) ×12 IMPLANT
KIT BASIN OR (CUSTOM PROCEDURE TRAY) ×4 IMPLANT
MESH VENTRALIGHT ST 6X8 (Mesh Specialty) ×4 IMPLANT
MESH VENTRLGHT ELLIPSE 8X6XMFL (Mesh Specialty) IMPLANT
NDL SPNL 22GX3.5 QUINCKE BK (NEEDLE) IMPLANT
NEEDLE SPNL 22GX3.5 QUINCKE BK (NEEDLE) IMPLANT
PEN SKIN MARKING BROAD (MISCELLANEOUS) ×4 IMPLANT
SCISSORS LAP 5X35 DISP (ENDOMECHANICALS) ×4 IMPLANT
SET IRRIG TUBING LAPAROSCOPIC (IRRIGATION / IRRIGATOR) IMPLANT
SHEARS HARMONIC ACE PLUS 36CM (ENDOMECHANICALS) IMPLANT
SLEEVE SURGEON STRL (DRAPES) ×2 IMPLANT
SLEEVE XCEL OPT CAN 5 100 (ENDOMECHANICALS) ×12 IMPLANT
SPONGE GAUZE 2X2 STER 10/PKG (GAUZE/BANDAGES/DRESSINGS) ×2
STRIP CLOSURE SKIN 1/2X4 (GAUZE/BANDAGES/DRESSINGS) ×5 IMPLANT
SUT MNCRL AB 4-0 PS2 18 (SUTURE) ×6 IMPLANT
SUT PDS AB 1 CT1 27 (SUTURE) ×8 IMPLANT
SUT PROLENE 1 CT 1 30 (SUTURE) ×22 IMPLANT
SUT SILK 2 0 SH (SUTURE) ×4 IMPLANT
TOWEL OR 17X26 10 PK STRL BLUE (TOWEL DISPOSABLE) ×4 IMPLANT
TRAY FOLEY W/METER SILVER 14FR (SET/KITS/TRAYS/PACK) ×2 IMPLANT
TRAY LAPAROSCOPIC (CUSTOM PROCEDURE TRAY) ×4 IMPLANT
TROCAR BLADELESS OPT 5 100 (ENDOMECHANICALS) ×4 IMPLANT
TROCAR XCEL NON-BLD 11X100MML (ENDOMECHANICALS) IMPLANT
TUNNELER SHEATH ON-Q 16GX12 DP (PAIN MANAGEMENT) IMPLANT

## 2014-07-22 NOTE — Op Note (Signed)
07/22/2014  4:18 PM  PATIENT:  Tracey Fields  54 y.o. female  Patient Care Team: Biagio Borg, MD as PCP - General Michael Boston, MD as Consulting Physician (General Surgery) Gatha Mayer, MD as Consulting Physician (Gastroenterology)  PRE-OPERATIVE DIAGNOSIS:  Ventral Wall Hernia  POST-OPERATIVE DIAGNOSIS:    Supraumbilical and umbilical ventral wall hernias Internal hernia (under left colon mesentery)  PROCEDURE:    LAPAROSCOPIC SUPRA-UMBILICAL AND UMBILICAL VENTRAL WALL HERNIA  REPAIR REDUCTION AND REPAIR OF INTERNAL HERNIAS LAPAROSCOPICALLY INSERTION OF MESH  SURGEON:  Surgeon(s): Michael Boston, MD  ASSISTANT: Karleen Dolphin, PA-student  ANESTHESIA:   local and general  EBL:  Total I/O In: 2000 [I.V.:2000] Out: 200 [Urine:200]  Delay start of Pharmacological VTE agent (>24hrs) due to surgical blood loss or risk of bleeding:  no  DRAINS: none   SPECIMEN:  No Specimen  DISPOSITION OF SPECIMEN:  N/A  COUNTS:  YES  PLAN OF CARE: Admit for overnight observation  PATIENT DISPOSITION:  PACU - hemodynamically stable.  INDICATION: Pleasant patient has developed a ventral wall abdominal hernia X 2.  Also has complaints of intermittent right lower quadrant abdominal pain.  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  After diagnostic laparoscopy with possible lysis of adhesions.  Recommendation was made for surgical repair:  The anatomy & physiology of the abdominal wall was discussed. The pathophysiology of hernias was discussed. Natural history risks without surgery including progeressive enlargement, pain, incarceration & strangulation was discussed. Contributors to complications such as smoking, obesity, diabetes, prior surgery, etc were discussed.  I feel the risks of no intervention will lead to serious problems that outweigh the operative risks; therefore, I recommended surgery to reduce and repair the hernia. I  explained laparoscopic techniques with possible need for an open approach. I noted the probable use of mesh to patch and/or buttress the hernia repair  Risks such as bleeding, infection, abscess, need for further treatment, heart attack, death, and other risks were discussed. I noted a good likelihood this will help address the problem. Goals of post-operative recovery were discussed as well. Possibility that this will not correct all symptoms was explained. I stressed the importance of low-impact activity, aggressive pain control, avoiding constipation, & not pushing through pain to minimize risk of post-operative chronic pain or injury. Possibility of reherniation especially with smoking, obesity, diabetes, immunosuppression, and other health conditions was discussed. We will work to minimize complications.  An educational handout further explaining the pathology & treatment options was given as well. Questions were answered. The patient expresses understanding & wishes to proceed with surgery.   OR FINDINGS: Super umbilical ventral wall hernia 4 x 3 cm incarcerated with falciform ligament within it.  2 x 0.5 cm umbilical hernia with prepared and fat within it.  Patient had internal hernia with all of her small bowel from terminal ileum to proximal jejunum incarcerated underneath her transverse colon along the left paracolic gutter.  No definite ischemia.  Some dilation.  No definite transition point.  No major adhesions to the anterior abdominal wall.  Greater omentum in left upper quadrant.   Type of repair - Laparoscopic underlay repair   Name of mesh - Bard Ventralight dual sided (polypropylene / Seprafilm)  Size of mesh - Length 20 cm, Width 15 cm  Mesh overlap - 5-7 cm  Placement of mesh - Intraperitoneal underlay repair   DESCRIPTION:   Informed consent was confirmed. The patient underwent general anaesthesia without difficulty. The patient was  positioned appropriately. VTE prevention  in place. The patient's abdomen was clipped, prepped, & draped in a sterile fashion. Surgical timeout confirmed our plan.  The patient was positioned in reverse Trendelenburg. Abdominal entry was gained using optical entry technique in the left upper abdomen. Entry was clean. I induced carbon dioxide insufflation. Camera inspection revealed no injury. Extra ports were carefully placed under direct laparoscopic visualization.   I could see the hernias in the central abdomen.  Obvious ventral hernia defect in the supraumbilical region with falciform ligament and preperitoneal fat incarcerated within it.  Also umbilical hernia defect.  No major abdominal adhesions noted.  I did laparoscopic lysis of adhesions to expose the entire anterior abdominal wall.  I primarily used and focused cold scissors.    I made sure hemostasis was good.  I mapped out the region using a needle passer.   To ensure that I would have at least 5 cm radial coverage outside of the hernia defect, I chose a 20x15 cm dual sided mesh.  I placed #1 Prolene stitches around its edge about every 5 cm = 14 total.  I rolled the mesh & placed into the peritoneal cavity through the the supraumbilical ventral wall hernia defect.  I unrolled  the mesh and positioned it appropriately.  I secured the mesh to cover up the hernia defect using a laparoscopic suture passer to pass the tails of the Prolene through the abdominal wall & tagged them with clamps.  I started out in four corners to make sure I had the mesh centered under the hernia defect appropriately, and then proceeded to work in quadrants.  We evacuated CO2 & desufflated the abdomen.  I tied the fascial stitches down.  I reinsufflated the abdomen.  The mesh provided at least 5-10 cm circumferential coverage around the entire region of hernia defects.   I tacked the edges & central part of the mesh to the peritoneum/posterior rectus fascia with  SecureStrap absorbable tacks.   Hemostasis was  excellent.  I closed the fascia at the supra umbilical ventral hernia transversely using interrupted #1 PDS suture.  I did lap his Expiration.  No abnormalities or adhesions noted.  Side to continue diagnostic laparoscopy.  I found the ileocecal valve.  I noted that the small bowel was incarcerated underneath the left colon mesentery from her prior robotic left colectomy.  Ran small bowel reduced it from underneath this internal hernia defect.  CO2 laparoscopically close this colonic mesenteric defect by tacking it back down to the retroperitoneum using a 2-0 silk running of the ligament of Treitz to the pelvic brim.  Good adhesions noted to the rectum and down the pelvis.  Checked and closed a small defect near the sacral promontory.  Surgeon do.  Official bites of the retroperitoneum on full of deeper injury.  Ran the small bowel saw no injury other abnormality.  Colon laid well.  Mesh laid well. Capnoperitoneum was evacuated. Ports were removed. The skin was closed with Monocryl at the port sites and Steri-Strips on the fascial stitch puncture sites.  Absolute bupivacaine placed at all fascial puncture sites.  Patient is extubated and talking in the recovery room.  Because of the reduction of the internal hernia, I think it would be best to observe her and make sure she is tolerating by mouth and mobilizing well before allowing discharge.  Hopefully just an overnight stay.  I discussed these operative findings and postoperative recommendations with the patient's sister.  Questions answered.  Understanding  and appreciation was expressed  [name], M.D., F.A.C.S. Gastrointestinal and Minimally Invasive Surgery [facility] [address] [place], [place] [id number] [phone] Main / Paging

## 2014-07-22 NOTE — Discharge Instructions (Signed)
HERNIA REPAIR: POST OP INSTRUCTIONS ° °1. DIET: Follow a light bland diet the first 24 hours after arrival home, such as soup, liquids, crackers, etc.  Be sure to include lots of fluids daily.  Avoid fast food or heavy meals as your are more likely to get nauseated.  Eat a low fat the next few days after surgery. °2. Take your usually prescribed home medications unless otherwise directed. °3. PAIN CONTROL: °a. Pain is best controlled by a usual combination of three different methods TOGETHER: °i. Ice/Heat °ii. Over the counter pain medication °iii. Prescription pain medication °b. Most patients will experience some swelling and bruising around the hernia(s) such as the bellybutton, groins, or old incisions.  Ice packs or heating pads (30-60 minutes up to 6 times a day) will help. Use ice for the first few days to help decrease swelling and bruising, then switch to heat to help relax tight/sore spots and speed recovery.  Some people prefer to use ice alone, heat alone, alternating between ice & heat.  Experiment to what works for you.  Swelling and bruising can take several weeks to resolve.   °c. It is helpful to take an over-the-counter pain medication regularly for the first few weeks.  Choose one of the following that works best for you: °i. Naproxen (Aleve, etc)  Two 220mg tabs twice a day °ii. Ibuprofen (Advil, etc) Three 200mg tabs four times a day (every meal & bedtime) °iii. Acetaminophen (Tylenol, etc) 325-650mg four times a day (every meal & bedtime) °d. A  prescription for pain medication should be given to you upon discharge.  Take your pain medication as prescribed.  °i. If you are having problems/concerns with the prescription medicine (does not control pain, nausea, vomiting, rash, itching, etc), please call us [phone] to see if we need to switch you to a different pain medicine that will work better for you and/or control your side effect better. °ii. If you need a refill on your pain  medication, please contact your pharmacy.  They will contact our office to request authorization. Prescriptions will not be filled after 5 pm or on week-ends. °4. Avoid getting constipated.  Between the surgery and the pain medications, it is common to experience some constipation.  Increasing fluid intake and taking a fiber supplement (such as Metamucil, Citrucel, FiberCon, MiraLax, etc) 1-2 times a day regularly will usually help prevent this problem from occurring.  A mild laxative (prune juice, Milk of Magnesia, MiraLax, etc) should be taken according to package directions if there are no bowel movements after 48 hours.   °5. Wash / shower every day.  You may shower over the dressings as they are waterproof.   °6. Remove your waterproof bandages 5 days after surgery.  You may leave the incision open to air.  You may replace a dressing/Band-Aid to cover the incision for comfort if you wish.  Continue to shower over incision(s) after the dressing is off. ° ° ° °7. ACTIVITIES as tolerated:   °a. You may resume regular (light) daily activities beginning the next day--such as daily self-care, walking, climbing stairs--gradually increasing activities as tolerated.  If you can walk 30 minutes without difficulty, it is safe to try more intense activity such as jogging, treadmill, bicycling, low-impact aerobics, swimming, etc. °b. Save the most intensive and strenuous activity for last such as sit-ups, heavy lifting, contact sports, etc  Refrain from any heavy lifting or straining until you are off narcotics for pain control.   °  c. DO NOT PUSH THROUGH PAIN.  Let pain be your guide: If it hurts to do something, don't do it.  Pain is your body warning you to avoid that activity for another week until the pain goes down. °d. You may drive when you are no longer taking prescription pain medication, you can comfortably wear a seatbelt, and you can safely maneuver your car and apply brakes. °e. You may have sexual intercourse  when it is comfortable.  °8. FOLLOW UP in our office °a. Please call [facility] at [phone] to set up an appointment to see your surgeon in the office for a follow-up appointment approximately 2-3 weeks after your surgery. °b. Make sure that you call for this appointment the day you arrive home to insure a convenient appointment time. °9.  IF YOU HAVE DISABILITY OR FAMILY LEAVE FORMS, BRING THEM TO THE OFFICE FOR PROCESSING.  DO NOT GIVE THEM TO YOUR DOCTOR. ° °WHEN TO CALL US [phone]: °1. Poor pain control °2. Reactions / problems with new medications (rash/itching, nausea, etc)  °3. Fever over 101.5 F (38.5 C) °4. Inability to urinate °5. Nausea and/or vomiting °6. Worsening swelling or bruising °7. Continued bleeding from incision. °8. Increased pain, redness, or drainage from the incision ° ° The clinic staff is available to answer your questions during regular business hours (8:30am-5pm).  Please don’t hesitate to call and ask to speak to one of our nurses for clinical concerns.  ° If you have a medical emergency, go to the nearest emergency room or call 911. ° A surgeon from [facility] is always on call at the hospitals in [place] ° °[facility] °[address], [address], [place], [place]  [address] ? ° [address] [address], [place], [place]   [address] °MAIN: [phone] ? TOLL FREE: [phone] ? FAX: [phone] °[email or web address] ° °Managing Pain ° °Pain after surgery or related to activity is often due to strain/injury to muscle, tendon, nerves and/or incisions.  This pain is usually short-term and will improve in a few months.  ° °Many people find it helpful to do the following things TOGETHER to help speed the process of healing and to get back to regular activity more quickly: ° °1. Avoid heavy physical activity at first °a. No lifting greater than 20 pounds at first, then increase to lifting as tolerated over the next few weeks °b. Do not “push  through” the pain.  Listen to your body and avoid positions and maneuvers than reproduce the pain.  Wait a few days before trying something more intense °c. Walking is okay as tolerated, but go slowly and stop when getting sore.  If you can walk 30 minutes without stopping or pain, you can try more intense activity (running, jogging, aerobics, cycling, swimming, treadmill, sex, sports, weightlifting, etc ) °d. Remember: If it hurts to do it, then don’t do it! ° °2. Take Anti-inflammatory medication °a. Choose ONE of the following over-the-counter medications: °i.            Acetaminophen 500mg tabs (Tylenol) 1-2 pills with every meal and just before bedtime (avoid if you have liver problems) °ii.            Naproxen 220mg tabs (ex. Aleve) 1-2 pills twice a day (avoid if you have kidney, stomach, IBD, or bleeding problems) °iii. Ibuprofen 200mg tabs (ex. Advil, Motrin) 3-4 pills with every meal and just before bedtime (avoid if you have kidney, stomach, IBD, or bleeding   problems) °b. Take with food/snack around the clock for 1-2 weeks °i. This helps the muscle and nerve tissues become less irritable and calm down faster ° °3. Use a Heating pad or Ice/Cold Pack °a. 4-6 times a day °b. May use warm bath/hottub  or showers ° °4. Try Gentle Massage and/or Stretching  °a. at the area of pain many times a day °b. stop if you feel pain - do not overdo it ° °Try these steps together to help you body heal faster and avoid making things get worse.  Doing just one of these things may not be enough.   ° °If you are not getting better after two weeks or are noticing you are getting worse, contact our office for further advice; we may need to re-evaluate you & see what other things we can do to help. ° °GETTING TO GOOD BOWEL HEALTH. °Irregular bowel habits such as constipation and diarrhea can lead to many problems over time.  Having one soft bowel movement a day is the most important way to prevent further problems.  The  anorectal canal is designed to handle stretching and feces to safely manage our ability to get rid of solid waste (feces, poop, stool) out of our body.  BUT, hard constipated stools can act like ripping concrete bricks and diarrhea can be a burning fire to this very sensitive area of our body, causing inflamed hemorrhoids, anal fissures, increasing risk is perirectal abscesses, abdominal pain/bloating, an making irritable bowel worse.     ° °The goal: ONE SOFT BOWEL MOVEMENT A DAY!  To have soft, regular bowel movements:  °• Drink plenty of fluids, consider 4-6 tall glasses of water a day.   °• Take plenty of fiber.  Fiber is the undigested part of plant food that passes into the colon, acting s “natures broom” to encourage bowel motility and movement.  Fiber can absorb and hold large amounts of water. This results in a larger, bulkier stool, which is soft and easier to pass. Work gradually over several weeks up to 6 servings a day of fiber (25g a day even more if needed) in the form of: °o Vegetables -- Root (potatoes, carrots, turnips), leafy green (lettuce, salad greens, celery, spinach), or cooked high residue (cabbage, broccoli, etc) °o Fruit -- Fresh (unpeeled skin & pulp), Dried (prunes, apricots, cherries, etc ),  or stewed ( applesauce)  °o Whole grain breads, pasta, etc (whole wheat)  °o Bran cereals  °• Bulking Agents -- This type of water-retaining fiber generally is easily obtained each day by one of the following:  °o Psyllium bran -- The psyllium plant is remarkable because its ground seeds can retain so much water. This product is available as Metamucil, Konsyl, Effersyllium, Per Diem Fiber, or the less expensive generic preparation in drug and health food stores. Although labeled a laxative, it really is not a laxative.  °o Methylcellulose -- This is another fiber derived from wood which also retains water. It is available as Citrucel. °o Polyethylene Glycol - and “artificial” fiber commonly called  Miralax or Glycolax.  It is helpful for people with gassy or bloated feelings with regular fiber °o Flax Seed - a less gassy fiber than psyllium °• No reading or other relaxing activity while on the toilet. If bowel movements take longer than 5 minutes, you are too constipated °• AVOID CONSTIPATION.  High fiber and water intake usually takes care of this.  Sometimes a laxative is needed to stimulate more frequent   bowel movements, but   Laxatives are not a good long-term solution as it can wear the colon out.  They can help jump-start bowels if constipated, but should be relied on constantly without discussing with your doctor o Osmotics (Milk of Magnesia, Fleets phosphosoda, Magnesium citrate, MiraLax, GoLytely) are safer than  o Stimulants (Senokot, Castor Oil, Dulcolax, Ex Lax)    o Avoid taking laxatives for more than 7 days in a row.   IF SEVERELY CONSTIPATED, try a Bowel Retraining Program: o Do not use laxatives.  o Eat a diet high in roughage, such as bran cereals and leafy vegetables.  o Drink six (6) ounces of prune or apricot juice each morning.  o Eat two (2) large servings of stewed fruit each day.  o Take one (1) heaping tablespoon of a psyllium-based bulking agent twice a day. Use sugar-free sweetener when possible to avoid excessive calories.  o Eat a normal breakfast.  o Set aside 15 minutes after breakfast to sit on the toilet, but do not strain to have a bowel movement.  o If you do not have a bowel movement by the third day, use an enema and repeat the above steps.   Controlling diarrhea o Switch to liquids and simpler foods for a few days to avoid stressing your intestines further. o Avoid dairy products (especially milk & ice cream) for a short time.  The intestines often can lose the ability to digest lactose when stressed. o Avoid foods that cause gassiness or bloating.  Typical foods include beans and other legumes, cabbage, broccoli, and dairy foods.  Every person has  some sensitivity to other foods, so listen to our body and avoid those foods that trigger problems for you. o Adding fiber (Citrucel, Metamucil, psyllium, Miralax) gradually can help thicken stools by absorbing excess fluid and retrain the intestines to act more normally.  Slowly increase the dose over a few weeks.  Too much fiber too soon can backfire and cause cramping & bloating. o Probiotics (such as active yogurt, Align, etc) may help repopulate the intestines and colon with normal bacteria and calm down a sensitive digestive tract.  Most studies show it to be of mild help, though, and such products can be costly. o Medicines: - Bismuth subsalicylate (ex. Kayopectate, Pepto Bismol) every 30 minutes for up to 6 doses can help control diarrhea.  Avoid if pregnant. - Loperamide (Immodium) can slow down diarrhea.  Start with two tablets (65m total) first and then try one tablet every 6 hours.  Avoid if you are having fevers or severe pain.  If you are not better or start feeling worse, stop all medicines and call your doctor for advice o Call your doctor if you are getting worse or not better.  Sometimes further testing (cultures, endoscopy, X-ray studies, bloodwork, etc) may be needed to help diagnose and treat the cause of the diarrhea.  TROUBLESHOOTING IRREGULAR BOWELS 1) Avoid extremes of bowel movements (no bad constipation/diarrhea) 2) Miralax 17gm mixed in 8oz. water or juice-daily. May use BID as needed.  3) Gas-x,Phazyme, etc. as needed for gas & bloating.  4) Soft,bland diet. No spicy,greasy,fried foods.  5) Prilosec over-the-counter as needed  6) May hold gluten/wheat products from diet to see if symptoms improve.  7)  May try probiotics (Align, Activa, etc) to help calm the bowels down 7) If symptoms become worse call back immediately.  Hernia Repair with Laparoscope A hernia occurs when an internal organ pushes out through a weak  spot in the belly (abdominal) wall muscles. Hernias  most commonly occur in the groin and around the navel. Hernias can also occur through a cut by the surgeon (incision) after an abdominal operation. A hernia may be caused by:  Lifting heavy objects.  Prolonged coughing.  Straining to move your bowels. Hernias can often be pushed back into place (reduced). Most hernias tend to get worse over time. Problems occur when abdominal contents get stuck in the opening and the blood supply is blocked or impaired (incarcerated hernia). Because of these risks, you require surgery to repair the hernia. Your hernia will be repaired using a laparoscope. Laparoscopic surgery is a type of minimally invasive surgery. It does not involve making a typical surgical cut (incision) in the skin. A laparoscope is a telescope-like rod and lens system. It is usually connected to a video camera and a light source so your caregiver can clearly see the operative area. The instruments are inserted through  to  inch (5 mm or 10 mm) openings in the skin at specific locations. A working and viewing space is created by blowing a small amount of carbon dioxide gas into the abdominal cavity. The abdomen is essentially blown up like a balloon (insufflated). This elevates the abdominal wall above the internal organs like a dome. The carbon dioxide gas is common to the human body and can be absorbed by tissue and removed by the respiratory system. Once the repair is completed, the small incisions will be closed with either stitches (sutures) or staples (just like a paper stapler only this staple holds the skin together). LET YOUR CAREGIVERS KNOW ABOUT:  Allergies.  Medications taken including herbs, eye drops, over the counter medications, and creams.  Use of steroids (by mouth or creams).  Previous problems with anesthetics or Novocaine.  Possibility of pregnancy, if this applies.  History of blood clots (thrombophlebitis).  History of bleeding or blood problems.  Previous  surgery.  Other health problems. BEFORE THE PROCEDURE  Laparoscopy can be done either in a hospital or out-patient clinic. You may be given a mild sedative to help you relax before the procedure. Once in the operating room, you will be given a general anesthesia to make you sleep (unless you and your caregiver choose a different anesthetic).  AFTER THE PROCEDURE  After the procedure you will be watched in a recovery area. Depending on what type of hernia was repaired, you might be admitted to the hospital or you might go home the same day. With this procedure you may have less pain and scarring. This usually results in a quicker recovery and less risk of infection. HOME CARE INSTRUCTIONS   Bed rest is not required. You may continue your normal activities but avoid heavy lifting (more than 10 pounds) or straining.  Cough gently. If you are a smoker it is best to stop, as even the best hernia repair can break down with the continual strain of coughing.  Avoid driving until given the OK by your surgeon.  There are no dietary restrictions unless given otherwise.  TAKE ALL MEDICATIONS AS DIRECTED.  Only take over-the-counter or prescription medicines for pain, discomfort, or fever as directed by your caregiver. SEEK MEDICAL CARE IF:   There is increasing abdominal pain or pain in your incisions.  There is more bleeding from incisions, other than minimal spotting.  You feel light headed or faint.  You develop an unexplained fever, chills, and/or an oral temperature above 102 F (38.9 C).  You have redness, swelling, or increasing pain in the wound.  Pus coming from wound.  A foul smell coming from the wound or dressings. SEEK IMMEDIATE MEDICAL CARE IF:   You develop a rash.  You have difficulty breathing.  You have any allergic problems. MAKE SURE YOU:   Understand these instructions.  Will watch your condition.  Will get help right away if you are not doing well or get  worse. Document Released: 01/22/2005 Document Revised: 04/16/2011 Document Reviewed: 12/22/2008 [facility] Patient Information 2015 [place], [place]. This information is not intended to replace advice given to you by your health care provider. Make sure you discuss any questions you have with your health care provider.

## 2014-07-22 NOTE — Transfer of Care (Signed)
Immediate Anesthesia Transfer of Care Note  Patient: Tracey Fields  Procedure(s) Performed: Procedure(s): LAPAROSCOPIC SUPRA-UMBILICAL AND UMBILICAL HERNIA  REPAIR, REDUCTION AND REPAIRS OF INTERNAL HERNIAS LAPAROSCOPICALLY (N/A) INSERTION OF MESH  Patient Location: PACU  Anesthesia Type:General  Level of Consciousness:  sedated, patient cooperative and responds to stimulation  Airway & Oxygen Therapy:Patient Spontanous Breathing and Patient connected to face mask oxgen  Post-op Assessment:  Report given to PACU RN and Post -op Vital signs reviewed and stable  Post vital signs:  Reviewed and stable  Last Vitals:  Filed Vitals:   07/22/14 1114  BP: 150/90  Pulse: 80  Temp: 37.1 C  Resp: 18    Complications: No apparent anesthesia complications

## 2014-07-22 NOTE — H&P (View-Only) (Signed)
Tracey Fields 06/28/2014 1:37 PM Location: [facility] Patient #: [id number] DOB: 1960/02/23 Divorced / Language: Tracey Fields / Race: White Female History of Present Illness Tracey Hector MD; 06/28/2014 2:12 PM) Patient words: hernia.  The patient is a 54 year old female who presents with abdominal swelling. Patient comes in today with worsening upper abdominal swelling. Concern for hernia. Allergies Tracey Fields, [name]; 06/28/2014 1:37 PM) Anesthetics, Amide Succinylcholine Chloride *CHEMICALS*  Medication History Tracey Fields, [name]; 06/28/2014 1:38 PM) ALPRAZolam (0.5MG  Tablet, Oral as needed) Active. Doxepin HCl (10MG  Capsule, Oral every other day) Active. Methocarbamol (750MG  Tablet, Oral daily) Active. Mirtazapine (15MG  Tablet, Oral daily) Active. Monoject Flush Syringe (0.9% Solution, Intravenous as needed) Active. Propranolol HCl (10MG  Tablet, Oral daily) Active. MetroNIDAZOLE (500MG  Tablet, Oral every other day) Active. Medications Reconciled    Vitals Tracey Fields [name]; 06/28/2014 1:39 PM) 06/28/2014 1:38 PM Weight: 146 lb Height: 65in Body Surface Area: 1.74 m Body Mass Index: 24.3 kg/m Temp.: 99.104F(Oral)  Pulse: 75 (Regular)  Resp.: 17 (Unlabored)  BP: 130/70 (Sitting, Left Arm, Standard)     Physical Exam Tracey Hector MD; 06/28/2014 3:20 PM)  General Mental Status-Alert. General Appearance-Not in acute distress. Voice-Normal.  Integumentary Global Assessment Upon inspection and palpation of skin surfaces of the - Distribution of scalp and body hair is normal. General Characteristics Overall examination of the patient's skin reveals - no rashes and no suspicious lesions.  Head and Neck Head-normocephalic, atraumatic with no lesions or palpable masses. Face Global Assessment - atraumatic, no absence of expression. Neck Global Assessment - no abnormal movements, no decreased range of  motion. Trachea-midline. Thyroid Gland Characteristics - non-tender.  Eye Eyeball - Left-Extraocular movements intact, No Nystagmus. Eyeball - Right-Extraocular movements intact, No Nystagmus. Upper Eyelid - Left-No Cyanotic. Upper Eyelid - Right-No Cyanotic.  Chest and Lung Exam Inspection Accessory muscles - No use of accessory muscles in breathing.  Abdomen Note: Overweight but soft. Bulging and supraumbilical and umbilical regions concerning for upper abdominal ventral hernias. Sensitive. Not near any incisions. Pfannenstiel incision well-healed. No abdominal pain.   Peripheral Vascular Upper Extremity Inspection - Left - Not Gangrenous, No Petechiae. Right - Not Gangrenous, No Petechiae.  Neurologic Neurologic evaluation reveals -normal attention span and ability to concentrate, able to name objects and repeat phrases. Appropriate fund of knowledge and normal coordination.  Neuropsychiatric Mental status exam performed with findings of-able to articulate well with normal speech/language, rate, volume and coherence and no evidence of hallucinations, delusions, obsessions or homicidal/suicidal ideation. Orientation-oriented X3.  Musculoskeletal Global Assessment Gait and Station - normal gait and station.  Lymphatic General Lymphatics Description - No Generalized lymphadenopathy.    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 The anatomy & physiology of the abdominal wall was discussed. The pathophysiology of hernias was discussed. Natural history risks without surgery including progeressive enlargement, pain, incarceration, & strangulation was discussed. Contributors to complications such as smoking, obesity, diabetes, prior surgery, etc were discussed.  I  feel the risks of no intervention will lead to serious problems that outweigh the operative risks; therefore, I recommended surgery to reduce and repair the hernia. I explained laparoscopic techniques with possible need for an open approach. I noted the probable use of mesh to patch and/or buttress the hernia repair  Risks such as bleeding, infection, abscess, need for further treatment, heart attack, death,  and other risks were discussed. I noted a good likelihood this will help address the problem. Goals of post-operative recovery were discussed as well. Possibility that this will not correct all symptoms was explained. I stressed the importance of low-impact activity, aggressive pain control, avoiding constipation, & not pushing through pain to minimize risk of post-operative chronic pain or injury. Possibility of reherniation especially with smoking, obesity, diabetes, immunosuppression, and other health conditions was discussed. We will work to minimize complications.  An educational handout further explaining the pathology & treatment options was given as well. Questions were answered. The patient expresses understanding & wishes to proceed with surgery.  Tracey Education - [facility] Hernia Post-Op HCI (Tracey Fields): discussed with patient and provided information. Tracey Education - [facility] Pain Control (Tracey Fields) Tracey Education - [facility] Good Bowel Health (Tracey Fields) Tracey Education - [facility] Free Text Education/Instructions: discussed with patient and provided information.  Tracey Fields, M.D., F.A.C.S. Gastrointestinal and Minimally Invasive Surgery [facility] [address] [place], [place] [address] [phone] Main / Paging

## 2014-07-22 NOTE — Anesthesia Preprocedure Evaluation (Addendum)
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 )  Airway Mallampati: II  TM Distance: >3 FB Neck ROM: Full    Dental no notable dental hx. (+) Teeth Intact, Dental Advisory Given,    Pulmonary shortness of breath, COPDCurrent Smoker,  breath sounds clear to auscultation  Pulmonary exam normal       Cardiovascular Exercise Tolerance: Good hypertension, On Home Beta Blockers Normal cardiovascular exam+ Valvular Problems/Murmurs MVP Rhythm:Regular Rate:Normal  PVC's   Neuro/Psych Anxiety Depression negative neurological ROS  negative psych ROS   GI/Hepatic Neg liver ROS, Diverticulitis; chronic pancreatitis.   Endo/Other  negative endocrine ROS  Renal/GU negative Renal ROS  negative genitourinary   Musculoskeletal negative musculoskeletal ROS (+)   Abdominal   Peds  Hematology negative hematology ROS (+)   Anesthesia Other Findings Patient presents today with lab work from prior surgical procedure which would be indicative of Pseudocholinesterase deficiency complicating prior anesthetic.  Reproductive/Obstetrics negative OB ROS                           Anesthesia Physical Anesthesia Plan  ASA: III  Anesthesia Plan: General   Post-op Pain Management:    Induction: Intravenous  Airway Management Planned: Oral ETT  Additional Equipment:   Intra-op Plan:   Post-operative Plan: Extubation in OR  Informed Consent: I have reviewed the  patients History and Physical, chart, labs and discussed the procedure including the risks, benefits and alternatives for the proposed anesthesia with the patient or authorized representative who has indicated his/her understanding and acceptance.   Dental Advisory Given  Plan Discussed with: CRNA and Surgeon  Anesthesia Plan Comments: (Story of [place] in past is not convincing and she had 3 hour anesthetic here in past with sevoflurane with no problems, so we will not give succinylcholine due to pseudocholinesterase deficiency but OK to use inhaled anesthetic.)      Anesthesia Quick Evaluation

## 2014-07-22 NOTE — Interval H&P Note (Signed)
History and Physical Interval Note:  07/22/2014 12:24 PM  Tracey Fields  has presented today for surgery, with the diagnosis of Ventral Wall Hernia  The various methods of treatment have been discussed with the patient and family. After consideration of risks, benefits and other options for treatment, the patient has consented to  Procedure(s): LAPAROSCOPIC EXPLORATION WITH VENTRAL WALL HERNIA R (N/A) as a surgical intervention .  The patient's history has been reviewed, patient examined, no change in status, stable for surgery.  I have reviewed the patient's chart and labs.  Questions were answered to the patient's satisfaction.     Deddrick Saindon C.

## 2014-07-22 NOTE — Anesthesia Postprocedure Evaluation (Signed)
  Anesthesia Post-op Note  Patient: Tracey Fields  Procedure(s) Performed: Procedure(s) (LRB): LAPAROSCOPIC SUPRA-UMBILICAL AND UMBILICAL HERNIA  REPAIR, REDUCTION AND REPAIRS OF INTERNAL HERNIAS LAPAROSCOPICALLY (N/A) INSERTION OF MESH  Patient Location: PACU  Anesthesia Type: General  Level of Consciousness: awake and alert   Airway and Oxygen Therapy: Patient Spontanous Breathing  Post-op Pain: mild  Post-op Assessment: Post-op Vital signs reviewed, Patient's Cardiovascular Status Stable, Respiratory Function Stable, Patent Airway and No signs of Nausea or vomiting  Last Vitals:  Filed Vitals:   07/22/14 1730  BP: 122/75  Pulse: 88  Temp: 37 C  Resp:     Post-op Vital Signs: stable   Complications: No apparent anesthesia complications

## 2014-07-22 NOTE — Anesthesia Procedure Notes (Signed)
Procedure Name: Intubation Date/Time: 07/22/2014 1:05 PM Performed by: Maxwell Caul Pre-anesthesia Checklist: Patient identified, Emergency Drugs available, Suction available and Patient being monitored Patient Re-evaluated:Patient Re-evaluated prior to inductionOxygen Delivery Method: Circle System Utilized Preoxygenation: Pre-oxygenation with 100% oxygen Intubation Type: IV induction Ventilation: Mask ventilation without difficulty and Oral airway inserted - appropriate to patient size Laryngoscope Size: Mac and 4 Grade View: Grade I Tube type: Oral Tube size: 7.0 mm Number of attempts: 1 Airway Equipment and Method: Stylet and Oral airway Placement Confirmation: ETT inserted through vocal cords under direct vision,  positive ETCO2 and breath sounds checked- equal and bilateral Secured at: 21 cm Tube secured with: Tape Dental Injury: Teeth and Oropharynx as per pre-operative assessment

## 2014-07-23 ENCOUNTER — Encounter (HOSPITAL_COMMUNITY): Payer: Self-pay | Admitting: Surgery

## 2014-07-23 DIAGNOSIS — K439 Ventral hernia without obstruction or gangrene: Secondary | ICD-10-CM | POA: Diagnosis not present

## 2014-07-23 MED ORDER — SODIUM CHLORIDE 0.9 % IJ SOLN: 3.0000 mL | Freq: Two times a day (BID) | INTRAMUSCULAR | Status: DC

## 2014-07-23 MED ORDER — LACTATED RINGERS IV BOLUS (SEPSIS): 1000.0000 mL | Freq: Three times a day (TID) | INTRAVENOUS | Status: DC | PRN

## 2014-07-23 MED ORDER — SODIUM CHLORIDE 0.9 % IV SOLN: 250.0000 mL | INTRAVENOUS | Status: DC | PRN

## 2014-07-23 MED ORDER — ACETAMINOPHEN 650 MG RE SUPP: 650.0000 mg | Freq: Four times a day (QID) | RECTAL | Status: DC | PRN

## 2014-07-23 MED ORDER — NAPROXEN 500 MG PO TABS
500.0000 mg | ORAL_TABLET | Freq: Two times a day (BID) | ORAL | Status: DC
  Administered 2014-07-23: 500 mg via ORAL
  Filled 2014-07-23 (×3): qty 1

## 2014-07-23 MED ORDER — ACETAMINOPHEN 325 MG PO TABS: 325.0000 mg | ORAL_TABLET | Freq: Four times a day (QID) | ORAL | Status: DC | PRN

## 2014-07-23 MED ORDER — SODIUM CHLORIDE 0.9 % IJ SOLN: 3.0000 mL | INTRAMUSCULAR | Status: DC | PRN

## 2014-07-23 MED FILL — Lactated Ringer's Solution: INTRAVENOUS | Qty: 1000 | Status: AC

## 2014-07-23 NOTE — Discharge Summary (Signed)
Physician Discharge Summary  Patient ID: Tracey Fields MRN: [id number] DOB/AGE: [birth year] 54 y.o.  Admit date: 07/22/2014 Discharge date: 07/23/2014  Patient Care Team: Biagio Borg, MD as PCP - General Michael Boston, MD as Consulting Physician (General Surgery) Gatha Mayer, MD as Consulting Physician (Gastroenterology)  Admission Diagnoses: Principal Problem:   Ventral hernia s/p lap repair w mesh 07/22/2014 Active Problems:   Internal hernia s/p lap reduction/repair 07/27/2977   Umbilical hernia s/p lap repair w mesh 07/22/2014   Discharge Diagnoses:  Principal Problem:   Ventral hernia s/p lap repair w mesh 07/22/2014 Active Problems:   Internal hernia s/p lap reduction/repair 8/92/1194   Umbilical hernia s/p lap repair w mesh 07/22/2014  Surgery 07/22/2014  POST-OPERATIVE DIAGNOSIS:   Supraumbilical and umbilical ventral wall hernias Internal hernia (under left colon mesentery)  PROCEDURE:   LAPAROSCOPIC SUPRA-UMBILICAL AND UMBILICAL VENTRAL WALL HERNIA REPAIR REDUCTION AND REPAIR OF INTERNAL HERNIAS LAPAROSCOPICALLY INSERTION OF MESH  SURGEON: Surgeon(s): Michael Boston, MD  Consults: None  Hospital Course:   The patient underwent  the surgery above.  Postoperatively, the patient gradually mobilized and advanced to a solid diet.  Pain and other symptoms were treated aggressively.    By the time of discharge, the patient was walking well the hallways, eating food, having flatus.  Pain was well-controlled on an oral medications.  Based on meeting discharge criteria and continuing to recover, I felt it was safe for the patient to be discharged from the hospital to further recover with close followup. Postoperative recommendations were discussed in detail.  They are written as well.   Significant Diagnostic Studies:  No results found for this or any previous visit (from the past 72 hour(s)).  No results found.  Discharge Exam: Blood pressure 130/82, pulse 67,  temperature 98 F (36.7 C), temperature source Oral, resp. rate 16, height 5\' 5"  (1.651 m), weight 65.318 kg (144 lb), SpO2 96 %.  General: Pt awake/alert/oriented x4 in no major acute distress Eyes: PERRL, normal EOM. Sclera nonicteric Neuro: CN II-XII intact w/o focal sensory/motor deficits. Lymph: No head/neck/groin lymphadenopathy Psych:  No delerium/psychosis/paranoia HENT: Normocephalic, Mucus membranes moist.  No thrush Neck: Supple, No tracheal deviation Chest: No pain.  Good respiratory excursion. CV:  Pulses intact.  Regular rhythm MS: Normal AROM mjr joints.  No obvious deformity Abdomen: Soft, Nondistended.  Mild soreness diffuse.  No incarcerated hernias. Ext:  SCDs BLE.  No significant edema.  No cyanosis Skin: No petechiae / purpura  Discharged Condition: good   Past Medical History  Diagnosis Date  . Abdominal pain, left lower quadrant 02/24/2010    pain resolved.  Marland Kitchen ANXIETY 09/21/2006  . BACK PAIN 01/12/2009  . COUGH, CHRONIC 09/21/2006    remains occ. with productive white mucus 07-19-14  . DEPRESSION 12/29/2007  . DIVERTICULOSIS, COLON 12/29/2007  . DYSPNEA 06/02/2008  . FATIGUE 07/14/2008  . HEMORRHOIDS, HX OF 09/21/2006    07-19-14 no problems now.  Marland Kitchen HOARSENESS, CHRONIC 08/13/2008    remains "raspy deep voice"  . Irritable bowel syndrome 09/21/2006  . 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 SORE THROAT 07/14/2008    07-19-14 no problem today.  . Wheezing 05/18/2009    remains as of 07-19-14  . Impaired glucose tolerance 10/19/2010  . Chronic pancreatitis 07/29/2012    Treatment Creon orally  . Mitral valve prolapse   . Left leg numbness     resolved-not a problem 07-19-14.  Marland Kitchen COPD (chronic obstructive pulmonary disease)   . Arthritis  thumbs   . Cancer     hx of skin cancers on face   . Family history of anesthesia complication     ? malignant hyerthermia- grandfather   . 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 Malignant hyperthermia   .  HYPERTENSION 12/29/2007    denies" takes Propranolol for tremors and heart rhythm  . Pain     more abdominal "midline to RLQ" on 07-19-14    Past Surgical History  Procedure Laterality Date  . Fractured jaw  1980    required wiring- no retained hardware  . Colonoscopy    . Upper gastrointestinal endoscopy    . Colonoscopy  07/15/2013   . Colon resection Left     "diverticulitis" 6''15  . Laparoscopic assisted ventral hernia repair N/A 07/22/2014    Procedure: LAPAROSCOPIC SUPRA-UMBILICAL AND UMBILICAL HERNIA  REPAIR, REDUCTION AND REPAIRS OF INTERNAL HERNIAS LAPAROSCOPICALLY;  Surgeon: Michael Boston, MD;  Location: [facility];  Service: General;  Laterality: N/A;  . Insertion of mesh  07/22/2014    Procedure: INSERTION OF MESH;  Surgeon: Michael Boston, MD;  Location: [facility];  Service: General;;    History   Social History  . Marital Status: Divorced    Spouse Name: N/A  . Number of Children: 2  . Years of Education: N/A   Occupational History  . independent Chief [employer]    Social History Main Topics  . Smoking status: Current Every Day Smoker -- 1.50 packs/day for 35 years    Types: Cigarettes  . Smokeless tobacco: Never Used     Comment: 1 ppd, patient has been counseled to quit smoking 08/13/08  . Alcohol Use: Yes     Comment: social-beer 1 nightly  . Drug Use: No  . Sexual Activity: Yes   Other Topics Concern  . Not on file   Social History Narrative    Family History  Problem Relation Age of Onset  . Lung cancer Father   . Prostate cancer Father   . Diabetes Maternal Grandfather   . Colon polyps Other   . Hypertension Mother   . COPD Mother     No current facility-administered medications for this encounter.   Current Outpatient Prescriptions  Medication Sig Dispense Refill  . ALPRAZolam (XANAX) 0.5 MG tablet Take 0.25 mg by mouth 3 (three) times daily as needed for anxiety.     Marland Kitchen aspirin EC 81 MG tablet Take 81 mg by mouth at bedtime.     .  Aspirin-Salicylamide-Caffeine (BC HEADACHE POWDER PO) Take 1 each by mouth daily as needed (pain.).    Marland Kitchen b complex vitamins capsule Take 1 capsule by mouth at bedtime.     . cetirizine (ZYRTEC) 10 MG tablet Take 5 mg by mouth at bedtime.     Marland Kitchen desvenlafaxine (PRISTIQ) 100 MG 24 hr tablet Take 200 mg by mouth every morning.     Marland Kitchen ibuprofen (ADVIL,MOTRIN) 200 MG tablet Take 400 mg by mouth every 6 (six) hours as needed for headache or moderate pain.    . Lactobacillus (PROBIOTIC ACIDOPHILUS PO) Take 1 capsule by mouth every morning.    . lipase/protease/amylase (CREON) 36000 UNITS CPEP capsule Take 1 capsule (36,000 Units total) by mouth 3 (three) times daily with meals. TAKE ONE PILL WITH EACH MEAL 270 capsule 1  . propranolol (INDERAL) 10 MG tablet Take 1 tablet (10 mg total) by mouth every morning. 30 tablet 1  . ranitidine (ZANTAC) 300 MG tablet TAKE 1 TABLET BY MOUTH AT BEDTIME. (Patient taking  differently: TAKE 1 TABLET BY MOUTH AT BEDTIME AS NEEDED FOR HEART BURN.) 90 tablet 2  . methocarbamol (ROBAXIN) 750 MG tablet Take 1 tablet (750 mg total) by mouth 4 (four) times daily as needed (use for muscle cramps/pain). (Patient not taking: Reported on 07/19/2014) 30 tablet 2  . oxyCODONE (OXY IR/ROXICODONE) 5 MG immediate release tablet Take 1-2 tablets (5-10 mg total) by mouth every 4 (four) hours as needed for moderate pain, severe pain or breakthrough pain. 40 tablet 0     Allergies  Allergen Reactions  . Anesthetics, Amide Other (See Comments)    System shut down when 54 years old having jaw work done  . Succinylcholine Chloride Other (See Comments)    Major organs stopped working    Disposition: 01-Home or Self Care  Discharge Instructions    Call MD for:  extreme fatigue    Complete by:  As directed      Call MD for:  extreme fatigue    Complete by:  As directed      Call MD for:  hives    Complete by:  As directed      Call MD for:  hives    Complete by:  As directed      Call  MD for:  persistant nausea and vomiting    Complete by:  As directed      Call MD for:  persistant nausea and vomiting    Complete by:  As directed      Call MD for:  redness, tenderness, or signs of infection (pain, swelling, redness, odor or green/yellow discharge around incision site)    Complete by:  As directed      Call MD for:  redness, tenderness, or signs of infection (pain, swelling, redness, odor or green/yellow discharge around incision site)    Complete by:  As directed      Call MD for:  severe uncontrolled pain    Complete by:  As directed      Call MD for:  severe uncontrolled pain    Complete by:  As directed      Call MD for:    Complete by:  As directed   Temperature > 101.68F     Call MD for:    Complete by:  As directed   Temperature > 101.68F     Diet - low sodium heart healthy    Complete by:  As directed      Diet - low sodium heart healthy    Complete by:  As directed      Discharge instructions    Complete by:  As directed   Please see discharge instruction sheets.  Also refer to handout given an office.  Please call our office if you have any questions or concerns [phone]     Discharge instructions    Complete by:  As directed   Please see discharge instruction sheets.  Also refer to handout given an office.  Please call our office if you have any questions or concerns [phone]     Discharge wound care:    Complete by:  As directed   If you have closed incisions, shower and bathe over these incisions with soap and water every day.  Remove all surgical dressings on postoperative day #3.  You do not need to replace dressings over the closed incisions unless you feel more comfortable with a Band-Aid covering it.   If you have an open wound that requires  packing, please see wound care instructions.  In general, remove all dressings, wash wound with soap and water and then replace with saline moistened gauze.  Do the dressing change at least every day.   Please call our office [phone] if you have further questions.     Discharge wound care:    Complete by:  As directed   If you have closed incisions, shower and bathe over these incisions with soap and water every day.  Remove all surgical dressings on postoperative day #3.  You do not need to replace dressings over the closed incisions unless you feel more comfortable with a Band-Aid covering it.   If you have an open wound that requires packing, please see wound care instructions.  In general, remove all dressings, wash wound with soap and water and then replace with saline moistened gauze.  Do the dressing change at least every day.  Please call our office [phone] if you have further questions.     Driving Restrictions    Complete by:  As directed   No driving until off narcotics and can safely swerve away without pain during an emergency     Driving Restrictions    Complete by:  As directed   No driving until off narcotics and can safely swerve away without pain during an emergency     Increase activity slowly    Complete by:  As directed   Walk an hour a day.  Use 20-30 minute walks.  When you can walk 30 minutes without difficulty, increase to low impact/moderate activities such as biking, jogging, swimming, sexual activity..  Eventually can increase to unrestricted activity when not feeling pain.  If you feel pain: STOP!Marland Kitchen   Let pain protect you from overdoing it.  Use ice/heat/over-the-counter pain medications to help minimize his soreness.  Use pain prescriptions as needed to remain active.  It is better to take extra pain medications and be more active than to stay bedridden to avoid all pain medications.     Increase activity slowly    Complete by:  As directed   Walk an hour a day.  Use 20-30 minute walks.  When you can walk 30 minutes without difficulty, increase to low impact/moderate activities such as biking, jogging, swimming, sexual activity..  Eventually can increase to  unrestricted activity when not feeling pain.  If you feel pain: STOP!Marland Kitchen   Let pain protect you from overdoing it.  Use ice/heat/over-the-counter pain medications to help minimize his soreness.  Use pain prescriptions as needed to remain active.  It is better to take extra pain medications and be more active than to stay bedridden to avoid all pain medications.     Lifting restrictions    Complete by:  As directed   Avoid heavy lifting initially.  Do not push through pain.  You have no specific weight limit.  Coughing and sneezing or four more stressful to your incision than any lifting you will do. Pain will protect you from injury.  Therefore, avoid intense activity until off all narcotic pain medications.  Coughing and sneezing or four more stressful to your incision than any lifting he will do.     Lifting restrictions    Complete by:  As directed   Avoid heavy lifting initially.  Do not push through pain.  You have no specific weight limit.  Coughing and sneezing or four more stressful to your incision than any lifting you will do. Pain will protect you from injury.  Therefore, avoid intense activity until off all narcotic pain medications.  Coughing and sneezing or four more stressful to your incision than any lifting he will do.     May shower / Bathe    Complete by:  As directed      May shower / Bathe    Complete by:  As directed      May walk up steps    Complete by:  As directed      May walk up steps    Complete by:  As directed      Sexual Activity Restrictions    Complete by:  As directed   Sexual activity as tolerated.  Do not push through pain.  Pain will protect you from injury.     Sexual Activity Restrictions    Complete by:  As directed   Sexual activity as tolerated.  Do not push through pain.  Pain will protect you from injury.     Walk with assistance    Complete by:  As directed   Walk over an hour a day.  May use a walker/cane/companion to help with balance and  stamina.     Walk with assistance    Complete by:  As directed   Walk over an hour a day.  May use a walker/cane/companion to help with balance and stamina.            Medication List    STOP taking these medications        amoxicillin-clavulanate 875-125 MG per tablet  Commonly known as:  AUGMENTIN     naproxen 500 MG tablet  Commonly known as:  NAPROSYN      TAKE these medications        ALPRAZolam 0.5 MG tablet  Commonly known as:  XANAX  Take 0.25 mg by mouth 3 (three) times daily as needed for anxiety.     aspirin EC 81 MG tablet  Take 81 mg by mouth at bedtime.     b complex vitamins capsule  Take 1 capsule by mouth at bedtime.     BC HEADACHE POWDER PO  Take 1 each by mouth daily as needed (pain.).     cetirizine 10 MG tablet  Commonly known as:  ZYRTEC  Take 5 mg by mouth at bedtime.     desvenlafaxine 100 MG 24 hr tablet  Commonly known as:  PRISTIQ  Take 200 mg by mouth every morning.     ibuprofen 200 MG tablet  Commonly known as:  ADVIL,MOTRIN  Take 400 mg by mouth every 6 (six) hours as needed for headache or moderate pain.     lipase/protease/amylase 36000 UNITS Cpep capsule  Commonly known as:  CREON  Take 1 capsule (36,000 Units total) by mouth 3 (three) times daily with meals. TAKE ONE PILL WITH EACH MEAL     methocarbamol 750 MG tablet  Commonly known as:  ROBAXIN  Take 1 tablet (750 mg total) by mouth 4 (four) times daily as needed (use for muscle cramps/pain).     oxyCODONE 5 MG immediate release tablet  Commonly known as:  Oxy IR/ROXICODONE  Take 1-2 tablets (5-10 mg total) by mouth every 4 (four) hours as needed for moderate pain, severe pain or breakthrough pain.     PROBIOTIC ACIDOPHILUS PO  Take 1 capsule by mouth every morning.     propranolol 10 MG tablet  Commonly known as:  INDERAL  Take 1 tablet (10 mg total) by mouth every morning.  ranitidine 300 MG tablet  Commonly known as:  ZANTAC  TAKE 1 TABLET BY MOUTH AT  BEDTIME.           Follow-up Information    Follow up with Yzabella Crunk C., MD. Schedule an appointment as soon as possible for a visit in 3 weeks.   Specialty:  General Surgery   Why:  To follow up after your operation, To follow up after your hospital stay   Contact information:   [place] [address] [phone]        Signed: Morton Peters, M.D., F.A.C.S. Gastrointestinal and Minimally Invasive Surgery [facility] [address] [place], [place] [address] [phone] Main / Paging   07/23/2014, 7:20 PM

## 2014-07-23 NOTE — Progress Notes (Signed)
Patient met all standards to be discharged. Tolerating food well. PO pain medication. Urinating and no complaints of nausea. Discharged home with spouse. Patient states understanding discharge instructions and education.

## 2014-07-23 NOTE — Progress Notes (Signed)
Patient has refused PO pain medications.  Says she takes pill at home and wants Morphine IV instead.

## 2014-09-21 ENCOUNTER — Other Ambulatory Visit: Payer: Self-pay | Admitting: Internal Medicine

## 2014-10-05 ENCOUNTER — Encounter: Payer: Self-pay | Admitting: Gynecology

## 2014-10-19 ENCOUNTER — Ambulatory Visit (INDEPENDENT_AMBULATORY_CARE_PROVIDER_SITE_OTHER): Payer: Self-pay | Admitting: Gynecology

## 2014-10-19 ENCOUNTER — Encounter: Payer: Self-pay | Admitting: Gynecology

## 2014-10-19 VITALS — BP 138/84 | Ht 64.0 in | Wt 143.0 lb

## 2014-10-19 DIAGNOSIS — F17200 Nicotine dependence, unspecified, uncomplicated: Secondary | ICD-10-CM

## 2014-10-19 DIAGNOSIS — IMO0001 Reserved for inherently not codable concepts without codable children: Secondary | ICD-10-CM

## 2014-10-19 DIAGNOSIS — Z01419 Encounter for gynecological examination (general) (routine) without abnormal findings: Secondary | ICD-10-CM

## 2014-10-19 DIAGNOSIS — Z780 Asymptomatic menopausal state: Secondary | ICD-10-CM

## 2014-10-19 DIAGNOSIS — Z720 Tobacco use: Secondary | ICD-10-CM

## 2014-10-19 LAB — WET PREP FOR TRICH, YEAST, CLUE
Trich, Wet Prep: NONE SEEN
Yeast Wet Prep HPF POC: NONE SEEN

## 2014-10-19 MED ORDER — TINIDAZOLE 500 MG PO TABS: ORAL_TABLET | ORAL | Status: DC

## 2014-10-19 NOTE — Patient Instructions (Addendum)
Menopause Menopause is the normal time of life when menstrual periods stop completely. Menopause is complete when you have missed 12 consecutive menstrual periods. It usually occurs between the ages of 48 years and 55 years. Very rarely does a woman develop menopause before the age of 40 years. At menopause, your ovaries stop producing the female hormones estrogen and progesterone. This can cause undesirable symptoms and also affect your health. Sometimes the symptoms may occur 4-5 years before the menopause begins. There is no relationship between menopause and:  Oral contraceptives.  Number of children you had.  Race.  The age your menstrual periods started (menarche). Heavy smokers and very thin women may develop menopause earlier in life. CAUSES  The ovaries stop producing the female hormones estrogen and progesterone.  Other causes include:  Surgery to remove both ovaries.  The ovaries stop functioning for no known reason.  Tumors of the pituitary gland in the brain.  Medical disease that affects the ovaries and hormone production.  Radiation treatment to the abdomen or pelvis.  Chemotherapy that affects the ovaries. SYMPTOMS   Hot flashes.  Night sweats.  Decrease in sex drive.  Vaginal dryness and thinning of the vagina causing painful intercourse.  Dryness of the skin and developing wrinkles.  Headaches.  Tiredness.  Irritability.  Memory problems.  Weight gain.  Bladder infections.  Hair growth of the face and chest.  Infertility. More serious symptoms include:  Loss of bone (osteoporosis) causing breaks (fractures).  Depression.  Hardening and narrowing of the arteries (atherosclerosis) causing heart attacks and strokes. DIAGNOSIS   When the menstrual periods have stopped for 12 straight months.  Physical exam.  Hormone studies of the blood. TREATMENT  There are many treatment choices and nearly as many questions about them. The  decisions to treat or not to treat menopausal changes is an individual choice made with your health care provider. Your health care provider can discuss the treatments with you. Together, you can decide which treatment will work best for you. Your treatment choices may include:   Hormone therapy (estrogen and progesterone).  Non-hormonal medicines.  Treating the individual symptoms with medicine (for example antidepressants for depression).  Herbal medicines that may help specific symptoms.  Counseling by a psychiatrist or psychologist.  Group therapy.  Lifestyle changes including:  Eating healthy.  Regular exercise.  Limiting caffeine and alcohol.  Stress management and meditation.  No treatment. HOME CARE INSTRUCTIONS   Take the medicine your health care provider gives you as directed.  Get plenty of sleep and rest.  Exercise regularly.  Eat a diet that contains calcium (good for the bones) and soy products (acts like estrogen hormone).  Avoid alcoholic beverages.  Do not smoke.  If you have hot flashes, dress in layers.  Take supplements, calcium, and vitamin D to strengthen bones.  You can use over-the-counter lubricants or moisturizers for vaginal dryness.  Group therapy is sometimes very helpful.  Acupuncture may be helpful in some cases. SEEK MEDICAL CARE IF:   You are not sure you are in menopause.  You are having menopausal symptoms and need advice and treatment.  You are still having menstrual periods after age 55 years.  You have pain with intercourse.  Menopause is complete (no menstrual period for 12 months) and you develop vaginal bleeding.  You need a referral to a specialist (gynecologist, psychiatrist, or psychologist) for treatment. SEEK IMMEDIATE MEDICAL CARE IF:   You have severe depression.  You have excessive vaginal bleeding.    You fell and think you have a broken bone.  You have pain when you urinate.  You develop leg or  chest pain.  You have a fast pounding heart beat (palpitations).  You have severe headaches.  You develop vision problems.  You feel a lump in your breast.  You have abdominal pain or severe indigestion. Document Released: 04/14/2003 Document Revised: 09/24/2012 Document Reviewed: 08/21/2012 [facility] Patient Information 2015 [place], [place]. This information is not intended to replace advice given to you by your health care provider. Make sure you discuss any questions you have with your health care provider. Smoking Cessation Quitting smoking is important to your health and has many advantages. However, it is not always easy to quit since nicotine is a very addictive drug. Oftentimes, people try 3 times or more before being able to quit. This document explains the best ways for you to prepare to quit smoking. Quitting takes hard work and a lot of effort, but you can do it. ADVANTAGES OF QUITTING SMOKING  You will live longer, feel better, and live better.  Your body will feel the impact of quitting smoking almost immediately.  Within 20 minutes, blood pressure decreases. Your pulse returns to its normal level.  After 8 hours, carbon monoxide levels in the blood return to normal. Your oxygen level increases.  After 24 hours, the chance of having a heart attack starts to decrease. Your breath, hair, and body stop smelling like smoke.  After 48 hours, damaged nerve endings begin to recover. Your sense of taste and smell improve.  After 72 hours, the body is virtually free of nicotine. Your bronchial tubes relax and breathing becomes easier.  After 2 to 12 weeks, lungs can hold more air. Exercise becomes easier and circulation improves.  The risk of having a heart attack, stroke, cancer, or lung disease is greatly reduced.  After 1 year, the risk of coronary heart disease is cut in half.  After 5 years, the risk of stroke falls to the same as a nonsmoker.  After 10 years, the risk  of lung cancer is cut in half and the risk of other cancers decreases significantly.  After 15 years, the risk of coronary heart disease drops, usually to the level of a nonsmoker.  If you are pregnant, quitting smoking will improve your chances of having a healthy baby.  The people you live with, especially any children, will be healthier.  You will have extra money to spend on things other than cigarettes. QUESTIONS TO THINK ABOUT BEFORE ATTEMPTING TO QUIT You may want to talk about your answers with your health care provider.  Why do you want to quit?  If you tried to quit in the past, what helped and what did not?  What will be the most difficult situations for you after you quit? How will you plan to handle them?  Who can help you through the tough times? Your family? Friends? A health care provider?  What pleasures do you get from smoking? What ways can you still get pleasure if you quit? Here are some questions to ask your health care provider:  How can you help me to be successful at quitting?  What medicine do you think would be best for me and how should I take it?  What should I do if I need more help?  What is smoking withdrawal like? How can I get information on withdrawal? GET READY  Set a quit date.  Change your environment by getting  rid of all cigarettes, ashtrays, matches, and lighters in your home, car, or work. Do not let people smoke in your home.  Review your past attempts to quit. Think about what worked and what did not. GET SUPPORT AND ENCOURAGEMENT You have a better chance of being successful if you have help. You can get support in many ways.  Tell your family, friends, and coworkers that you are going to quit and need their support. Ask them not to smoke around you.  Get individual, group, or telephone counseling and support. Programs are available at [employer] and health centers. Call your local health department for information about  programs in your area.  Spiritual beliefs and practices may help some smokers quit.  Download a "quit meter" on your computer to keep track of quit statistics, such as how long you have gone without smoking, cigarettes not smoked, and money saved.  Get a self-help book about quitting smoking and staying off tobacco. [place] yourself from urges to smoke. Talk to someone, go for a walk, or occupy your time with a task.  Change your normal routine. Take a different route to work. Drink tea instead of coffee. Eat breakfast in a different place.  Reduce your stress. Take a hot bath, exercise, or read a book.  Plan something enjoyable to do every day. Reward yourself for not smoking.  Explore interactive web-based programs that specialize in helping you quit. GET MEDICINE AND USE IT CORRECTLY Medicines can help you stop smoking and decrease the urge to smoke. Combining medicine with the above behavioral methods and support can greatly increase your chances of successfully quitting smoking.  Nicotine replacement therapy helps deliver nicotine to your body without the negative effects and risks of smoking. Nicotine replacement therapy includes nicotine gum, lozenges, inhalers, nasal sprays, and skin patches. Some may be available over-the-counter and others require a prescription.  Antidepressant medicine helps people abstain from smoking, but how this works is unknown. This medicine is available by prescription.  Nicotinic receptor partial agonist medicine simulates the effect of nicotine in your brain. This medicine is available by prescription. Ask your health care provider for advice about which medicines to use and how to use them based on your health history. Your health care provider will tell you what side effects to look out for if you choose to be on a medicine or therapy. Carefully read the information on the package. Do not use any other product  containing nicotine while using a nicotine replacement product.  RELAPSE OR DIFFICULT SITUATIONS Most relapses occur within the first 3 months after quitting. Do not be discouraged if you start smoking again. Remember, most people try several times before finally quitting. You may have symptoms of withdrawal because your body is used to nicotine. You may crave cigarettes, be irritable, feel very hungry, cough often, get headaches, or have difficulty concentrating. The withdrawal symptoms are only temporary. They are strongest when you first quit, but they will go away within 10-14 days. To reduce the chances of relapse, try to:  Avoid drinking alcohol. Drinking lowers your chances of successfully quitting.  Reduce the amount of caffeine you consume. Once you quit smoking, the amount of caffeine in your body increases and can give you symptoms, such as a rapid heartbeat, sweating, and anxiety.  Avoid smokers because they can make you want to smoke.  Do not let weight gain distract you. Many smokers will gain weight when they quit, usually  less than 10 pounds. Eat a healthy diet and stay active. You can always lose the weight gained after you quit.  Find ways to improve your mood other than smoking. FOR MORE INFORMATION  www.smokefree.gov  Document Released: 01/16/2001 Document Revised: 06/08/2013 Document Reviewed: 05/03/2011 [facility] Patient Information 2015 [place], [place]. This information is not intended to replace advice given to you by your health care provider. Make sure you discuss any questions you have with your health care provider. Tinidazole tablets What is this medicine? TINIDAZOLE (tye NI da zole) is an antiinfective. It is used to treat amebiasis, giardiasis, trichomoniasis, and vaginosis. It will not work for colds, flu, or other viral infections. This medicine may be used for other purposes; ask your health care provider or pharmacist if you have questions. COMMON BRAND  NAME(S): Tindamax What should I tell my health care provider before I take this medicine? They need to know if you have any of these conditions: -anemia or other blood disorders -if you frequently drink alcohol containing drinks -receiving hemodialysis -seizure disorder -an unusual or allergic reaction to tinidazole, other medicines, foods, dyes, or preservatives -pregnant or trying to get pregnant -breast-feeding How should I use this medicine? Take this medicine by mouth with a full glass of water. Follow the directions on the prescription label. Take with food. Take your medicine at regular intervals. Do not take your medicine more often than directed. Take all of your medicine as directed even if you think you are better. Do not skip doses or stop your medicine early. Talk to your pediatrician regarding the use of this medicine in children. While this drug may be prescribed for children as young as 50 years of age for selected conditions, precautions do apply. Overdosage: If you think you have taken too much of this medicine contact a poison control center or emergency room at once. NOTE: This medicine is only for you. Do not share this medicine with others. What if I miss a dose? If you miss a dose, take it as soon as you can. If it is almost time for your next dose, take only that dose. Do not take double or extra doses. What may interact with this medicine? Do not take this medicine with any of the following medications: -alcohol or any product that contains alcohol -amprenavir oral solution -disulfiram -paclitaxel injection -ritonavir oral solution -sertraline oral solution -sulfamethoxazole-trimethoprim injection This medicine may also interact with the following medications: -cholestyramine -cimetidine -conivaptan -cyclosporin -fluorouracil -fosphenytoin, phenytoin -ketoconazole -lithium -phenobarbital -tacrolimus -warfarin This list may not describe all possible  interactions. Give your health care provider a list of all the medicines, herbs, non-prescription drugs, or dietary supplements you use. Also tell them if you smoke, drink alcohol, or use illegal drugs. Some items may interact with your medicine. What should I watch for while using this medicine? Tell your doctor or health care professional if your symptoms do not improve or if they get worse. Avoid alcoholic drinks while you are taking this medicine and for three days afterward. Alcohol may make you feel dizzy, sick, or flushed. If you are being treated for a sexually transmitted disease, avoid sexual contact until you have finished your treatment. Your sexual partner may also need treatment. What side effects may I notice from receiving this medicine? Side effects that you should report to your doctor or health care professional as soon as possible: -allergic reactions like skin rash, itching or hives, swelling of the face, lips, or tongue -breathing problems -confusion, depression -  dark or white patches in the mouth -feeling faint or lightheaded, falls -fever, infection -numbness, tingling, pain or weakness in the hands or feet -pain when passing urine -seizures -unusually weak or tired -vaginal irritation or discharge -vomiting Side effects that usually do not require medical attention (report to your doctor or health care professional if they continue or are bothersome): -dark brown or reddish urine -diarrhea -headache -loss of appetite -metallic taste -nausea -stomach upset This list may not describe all possible side effects. Call your doctor for medical advice about side effects. You may report side effects to FDA at 1-800-FDA-1088. Where should I keep my medicine? Keep out of the reach of children. Store at room temperature between 15 and 30 degrees C (59 and 86 degrees F). Protect from light and moisture. Keep container tightly closed. Throw away any unused medicine after the  expiration date. NOTE: This sheet is a summary. It may not cover all possible information. If you have questions about this medicine, talk to your doctor, pharmacist, or health care provider.  2015, [employer] Standard. (2007-10-20 15:22:28) Bacterial Vaginosis Bacterial vaginosis is a vaginal infection that occurs when the normal balance of bacteria in the vagina is disrupted. It results from an overgrowth of certain bacteria. This is the most common vaginal infection in women of childbearing age. Treatment is important to prevent complications, especially in pregnant women, as it can cause a premature delivery. CAUSES  Bacterial vaginosis is caused by an increase in harmful bacteria that are normally present in smaller amounts in the vagina. Several different kinds of bacteria can cause bacterial vaginosis. However, the reason that the condition develops is not fully understood. RISK FACTORS Certain activities or behaviors can put you at an increased risk of developing bacterial vaginosis, including:  Having a new sex partner or multiple sex partners.  Douching.  Using an intrauterine device (IUD) for contraception. Women do not get bacterial vaginosis from toilet seats, bedding, swimming pools, or contact with objects around them. SIGNS AND SYMPTOMS  Some women with bacterial vaginosis have no signs or symptoms. Common symptoms include:  Grey vaginal discharge.  A fishlike odor with discharge, especially after sexual intercourse.  Itching or burning of the vagina and vulva.  Burning or pain with urination. DIAGNOSIS  Your health care provider will take a medical history and examine the vagina for signs of bacterial vaginosis. A sample of vaginal fluid may be taken. Your health care provider will look at this sample under a microscope to check for bacteria and abnormal cells. A vaginal pH test may also be done.  TREATMENT  Bacterial vaginosis may be treated with antibiotic  medicines. These may be given in the form of a pill or a vaginal cream. A second round of antibiotics may be prescribed if the condition comes back after treatment.  HOME CARE INSTRUCTIONS   Only take over-the-counter or prescription medicines as directed by your health care provider.  If antibiotic medicine was prescribed, take it as directed. Make sure you finish it even if you start to feel better.  Do not have sex until treatment is completed.  Tell all sexual partners that you have a vaginal infection. They should see their health care provider and be treated if they have problems, such as a mild rash or itching.  Practice safe sex by using condoms and only having one sex partner. SEEK MEDICAL CARE IF:   Your symptoms are not improving after 3 days of treatment.  You have increased discharge or  pain.  You have a fever. MAKE SURE YOU:   Understand these instructions.  Will watch your condition.  Will get help right away if you are not doing well or get worse. FOR MORE INFORMATION  Centers for Disease Control and Prevention, Division of STD Prevention: [employer] American Sexual Health Association (ASHA): www.ashastd.org  Document Released: 01/22/2005 Document Revised: 11/12/2012 Document Reviewed: 09/03/2012 [facility] Patient Information 2015 [place], [place]. This information is not intended to replace advice given to you by your health care provider. Make sure you discuss any questions you have with your health care provider.

## 2014-10-19 NOTE — Progress Notes (Signed)
Tracey Fields Apr 26, 1960 [id number]   History:    54 y.o.  for annual gyn exam with vaginal odor and slight discharge. She states that she's had a new partner now since January. Not interested in HIV tests or any other STD screen except a GC and Chlamydia culture and a wet prep. She is a chronic smoker continues to smoke a pack cigarette per day despite numerous times that she has been counseled on the detrimental effect. Her mother has COPD and emphysema from been a smoker for such a long time as well. Patient is seeking to have a new primary care physician and is asking for referral.  Review of her record indicated that she has been followed by the gastroenterologist Dr. Carlean Purl for her chronic pancreatitis, in June 2015 she had a normal colonoscopy with the exception of severe diverticulosis at the sigmoid colon. Later that month she had robotic-assisted laparoscopic left colectomy by Dr. 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 Later that she had a laparoscopic supraumbilical and umbilical ventral hernia repair, reduction and repair of internal hernias laparoscopically and insertion of mesh also by Dr. Laurel Dimmer. Patient has recovered well.  Patient has not had a menstrual cycle in approximately 10 months. She has some occasional vasomotor symptoms. She has no past history of any abnormal Pap smears.  Past medical history,surgical history, family history and social history were all reviewed and documented in the EPIC chart.  Gynecologic History Patient's last menstrual period was 06/18/2014. Contraception: post menopausal status Last Pap: 2014. Results were: normal Last mammogram: 2015. Results were: normal  Obstetric History OB History  Gravida Para Term Preterm AB SAB TAB Ectopic Multiple Living  2 2        2     # Outcome Date GA Lbr Len/2nd Weight Sex Delivery Anes PTL Lv  2 Para           1 Para                ROS: A ROS was performed and  pertinent positives and negatives are included in the history.  GENERAL: No fevers or chills. HEENT: No change in vision, no earache, sore throat or sinus congestion. NECK: No pain or stiffness. CARDIOVASCULAR: No chest pain or pressure. No palpitations. PULMONARY: No shortness of breath, cough or wheeze. GASTROINTESTINAL: No abdominal pain, nausea, vomiting or diarrhea, melena or bright red blood per rectum. GENITOURINARY: No urinary frequency, urgency, hesitancy or dysuria. MUSCULOSKELETAL: No joint or muscle pain, no back pain, no recent trauma. DERMATOLOGIC: No rash, no itching, no lesions. ENDOCRINE: No polyuria, polydipsia, no heat or cold intolerance. No recent change in weight. HEMATOLOGICAL: No anemia or easy bruising or bleeding. NEUROLOGIC: No headache, seizures, numbness, tingling or weakness. PSYCHIATRIC: No depression, no loss of interest in normal activity or change in sleep pattern.     Exam: chaperone present  BP 138/84 mmHg  Ht 5\' 4"  (1.626 m)  Wt 143 lb (64.864 kg)  BMI 24.53 kg/m2  LMP 06/18/2014  Body mass index is 24.53 kg/(m^2).  General appearance : Well developed well nourished female. No acute distress HEENT: Eyes: no retinal hemorrhage or exudates,  Neck supple, trachea midline, no carotid bruits, no thyroidmegaly Lungs: Clear to auscultation, no rhonchi or wheezes, or rib retractions  Heart: Regular rate and rhythm, no murmurs or gallops Breast:Examined in sitting and supine position were symmetrical in appearance, no palpable masses or tenderness,  no skin retraction, no nipple  inversion, no nipple discharge, no skin discoloration, no axillary or supraclavicular lymphadenopathy Abdomen: no palpable masses or tenderness, no rebound or guarding Extremities: no edema or skin discoloration or tenderness  Pelvic:  Bartholin, Urethra, Skene Glands: Within normal limits             Vagina: No gross lesions or discharge  Cervix: No gross lesions or discharge  Uterus   anteverted, normal size, shape and consistency, non-tender and mobile  Adnexa  Without masses or tenderness  Anus and perineum  normal   Rectovaginal  normal sphincter tone without palpated masses or tenderness             Hemoccult  will provide  Wet prep: Positive amine, many clue cells, few WBC, too numerous to count bacteria  GC and Chlamydia culture obtained pending  Assessment/Plan:  54 y.o. female for annual exam who was previously seen Dr. Cathlean Cower would like to see a different PCP. I'll give her several names of colleagues in the community. She was reminded to schedule her mammogram December of this year. Pap smear not indicated today. Patient will return back later this week in a fasting state for the following screening blood work: Comprehensive metabolic panel, fasting lipid profile, TSH, CBC, and urinalysis. GC and Chlamydia culture pending at time of this dictation. For her bacterial vaginosis she will be prescribed Tindamax 500 mg tablets. She will take 4 tablets today and repeat in 24 hour period she was once again counseled on the detrimental effects of smoking. I have reservation starting on hormone replacement therapy because of her increased risk of DVT and pulmonary embolism. Recommended she try over-the-counter at the health food store peppermint oil to placed behind her ear 2-3 times a a day. Patient declined flu vaccine.   Terrance Mass MD, 5:07 PM 10/19/2014

## 2014-10-20 ENCOUNTER — Telehealth: Payer: Self-pay | Admitting: *Deleted

## 2014-10-20 ENCOUNTER — Other Ambulatory Visit: Payer: Self-pay

## 2014-10-20 LAB — GC/CHLAMYDIA PROBE AMP
CT Probe RNA: NEGATIVE
GC Probe RNA: NEGATIVE

## 2014-10-20 NOTE — Telephone Encounter (Signed)
Prior Authorization started with [place] [employer], information was sent to pharmacy for approved, will wait for response

## 2014-10-21 ENCOUNTER — Other Ambulatory Visit: Payer: Self-pay

## 2014-10-21 DIAGNOSIS — Z01419 Encounter for gynecological examination (general) (routine) without abnormal findings: Secondary | ICD-10-CM

## 2014-10-21 LAB — LIPID PANEL
Cholesterol: 194 mg/dL (ref 125–200)
HDL: 58 mg/dL (ref >=46)
LDL Cholesterol: 101 mg/dL (ref <130)
Total CHOL/HDL Ratio: 3.3 Ratio (ref <=5.0)
Triglycerides: 177 mg/dL — ABNORMAL HIGH (ref <150)
VLDL: 35 mg/dL — ABNORMAL HIGH (ref <30)

## 2014-10-21 LAB — CBC WITH DIFFERENTIAL/PLATELET
Basophils Absolute: 0 10*3/uL (ref 0.0–0.1)
Basophils Relative: 0 % (ref 0–1)
Eosinophils Absolute: 0.1 10*3/uL (ref 0.0–0.7)
Eosinophils Relative: 2 % (ref 0–5)
HEMATOCRIT: 39.3 % (ref 36.0–46.0)
HEMOGLOBIN: 13.6 g/dL (ref 12.0–15.0)
LYMPHS PCT: 37 % (ref 12–46)
Lymphs Abs: 2.2 10*3/uL (ref 0.7–4.0)
MCH: 30.8 pg (ref 26.0–34.0)
MCHC: 34.6 g/dL (ref 30.0–36.0)
MCV: 89.1 fL (ref 78.0–100.0)
MPV: 9.4 fL (ref 8.6–12.4)
Monocytes Absolute: 0.5 10*3/uL (ref 0.1–1.0)
Monocytes Relative: 8 % (ref 3–12)
Neutro Abs: 3.2 10*3/uL (ref 1.7–7.7)
Neutrophils Relative %: 53 % (ref 43–77)
Platelets: 261 10*3/uL (ref 150–400)
RBC: 4.41 MIL/uL (ref 3.87–5.11)
RDW: 14.7 % (ref 11.5–15.5)
WBC: 6 10*3/uL (ref 4.0–10.5)

## 2014-10-21 LAB — COMPREHENSIVE METABOLIC PANEL
ALBUMIN: 4.2 g/dL (ref 3.6–5.1)
ALK PHOS: 84 U/L (ref 33–130)
ALT: 18 U/L (ref 6–29)
AST: 19 U/L (ref 10–35)
BUN: 11 mg/dL (ref 7–25)
CALCIUM: 9.8 mg/dL (ref 8.6–10.4)
CO2: 29 mmol/L (ref 20–31)
CREATININE: 0.83 mg/dL (ref 0.50–1.05)
Chloride: 104 mmol/L (ref 98–110)
Glucose, Bld: 116 mg/dL — ABNORMAL HIGH (ref 65–99)
Potassium: 4.5 mmol/L (ref 3.5–5.3)
SODIUM: 142 mmol/L (ref 135–146)
Total Bilirubin: 0.4 mg/dL (ref 0.2–1.2)
Total Protein: 6.6 g/dL (ref 6.1–8.1)

## 2014-10-28 NOTE — Telephone Encounter (Signed)
Rx was denied, I called to speak with pt about this to see if she paid out of pocket for Rx. I asked pt to call me

## 2014-11-25 ENCOUNTER — Telehealth: Payer: Self-pay | Admitting: *Deleted

## 2014-11-25 MED ORDER — METRONIDAZOLE 500 MG PO TABS: 500.0000 mg | ORAL_TABLET | Freq: Two times a day (BID) | ORAL | Status: DC

## 2014-11-25 NOTE — Telephone Encounter (Signed)
Patient was prescribed Tindamax 500 mg tablets # 8 on OV 10/19/14, states symptoms have returned c/o vaginal odor and slight discharge. Pt asked for refill on Tindamax 500 mg, I explained to patient OV for infections, states she doesn't  have insurance at the time and to check with you to see if you would be willing to approve. Please advise

## 2014-11-25 NOTE — Telephone Encounter (Signed)
If she is not allergic call in Flagyl 500 mg one by mouth twice a day for 7 days 14 tablets

## 2014-11-25 NOTE — Telephone Encounter (Signed)
Pt aware Rx sent. She was very thankful for Rx

## 2015-03-08 ENCOUNTER — Other Ambulatory Visit: Payer: Self-pay | Admitting: Internal Medicine

## 2015-03-23 ENCOUNTER — Other Ambulatory Visit: Payer: Self-pay | Admitting: Gynecology

## 2015-03-23 DIAGNOSIS — Z1231 Encounter for screening mammogram for malignant neoplasm of breast: Secondary | ICD-10-CM

## 2015-04-11 ENCOUNTER — Ambulatory Visit
Admission: RE | Admit: 2015-04-11 | Discharge: 2015-04-11 | Disposition: A | Discharge status: Home / Self Care | Payer: No Typology Code available for payment source | Source: Ambulatory Visit | Attending: Gynecology | Admitting: Gynecology

## 2015-04-11 DIAGNOSIS — Z1231 Encounter for screening mammogram for malignant neoplasm of breast: Secondary | ICD-10-CM

## 2015-04-14 ENCOUNTER — Other Ambulatory Visit: Payer: Self-pay | Admitting: Gynecology

## 2015-04-14 DIAGNOSIS — R928 Other abnormal and inconclusive findings on diagnostic imaging of breast: Secondary | ICD-10-CM

## 2015-04-19 ENCOUNTER — Other Ambulatory Visit (HOSPITAL_COMMUNITY): Payer: Self-pay | Admitting: *Deleted

## 2015-04-19 DIAGNOSIS — R928 Other abnormal and inconclusive findings on diagnostic imaging of breast: Secondary | ICD-10-CM

## 2015-04-21 ENCOUNTER — Ambulatory Visit
Admission: RE | Admit: 2015-04-21 | Discharge: 2015-04-21 | Disposition: A | Discharge status: Home / Self Care | Payer: No Typology Code available for payment source | Source: Ambulatory Visit | Attending: Gynecology | Admitting: Gynecology

## 2015-04-21 ENCOUNTER — Encounter (HOSPITAL_COMMUNITY): Payer: Self-pay

## 2015-04-21 ENCOUNTER — Ambulatory Visit (HOSPITAL_COMMUNITY)
Admission: RE | Admit: 2015-04-21 | Discharge: 2015-04-21 | Disposition: A | Discharge status: Home / Self Care | Payer: Medicaid Other | Source: Ambulatory Visit | Attending: Obstetrics and Gynecology | Admitting: Obstetrics and Gynecology

## 2015-04-21 VITALS — BP 128/86 | Temp 98.6°F | Ht 65.0 in | Wt 151.0 lb

## 2015-04-21 DIAGNOSIS — R928 Other abnormal and inconclusive findings on diagnostic imaging of breast: Secondary | ICD-10-CM

## 2015-04-21 DIAGNOSIS — Z1239 Encounter for other screening for malignant neoplasm of breast: Secondary | ICD-10-CM

## 2015-04-21 NOTE — Progress Notes (Signed)
Patient referred to [facility] by the [place] [facility] due to recommending additional imaging of the right breast. Screening mammogram completed 04/11/2015 at the [facility].  Pap Smear: Pap smear not completed today. Last Pap smear was 08/05/2012 at [facility] and normal with negative HPV. Per patient has no history of an abnormal Pap smear. Last Pap smear result is in EPIC.  Physical exam: Breasts Breasts symmetrical. No skin abnormalities bilateral breasts. No nipple retraction bilateral breasts. No nipple discharge bilateral breasts. No lymphadenopathy. No lumps palpated bilateral breasts. No complaints of pain or tenderness on exam.  Referred patient to the [facility] for a right breast diagnostic mammogram per recommendation. Appointment scheduled for Thursday, April 21, 2015 at 1540.   Pelvic/Bimanual No Pap smear completed today since last Pap smear was 08/05/2012. Pap smear not indicated per BCCCP guidelines.   Smoking History: Patient is a current smoker.Discussed smoking cessation with patient. [facility]erred [facility] and gave resources to free smoking cessation program offered at the [facility]   Patient Navigation: Patient education provided. Access to services provided for patient through [facility] program.   Colorectal Cancer Screening: Patient had a colonoscopy completed 2 years ago. No complaints today.

## 2015-04-21 NOTE — Patient Instructions (Addendum)
Educational materials on self breast awareness given. Explained to Tracey Fields that she did not need a Pap smear today due to last Pap smear and HPV typing was 08/05/2012. Let her know BCCCP will cover Pap smears and HPV typing every 5 years unless has a history of abnormal Pap smears. Referred patient to the [facility] for a right breast diagnostic mammogram per recommendation. Appointment scheduled for Thursday, April 21, 2015 at 1540. Patient aware of appointment and will be there. Discussed smoking cessation with patient. Referred to the [facility] Quitline and gave resources to free smoking cessation program offered at the [facility] Tracey Fields verbalized understanding.  Jolane Bankhead, Arvil Chaco, RN 3:23 PM

## 2015-04-22 ENCOUNTER — Ambulatory Visit (HOSPITAL_COMMUNITY): Payer: No Typology Code available for payment source

## 2015-04-28 ENCOUNTER — Other Ambulatory Visit: Payer: No Typology Code available for payment source

## 2015-05-04 ENCOUNTER — Encounter (HOSPITAL_COMMUNITY): Payer: Self-pay | Admitting: *Deleted

## 2016-01-23 ENCOUNTER — Encounter (HOSPITAL_COMMUNITY): Payer: Self-pay

## 2016-01-23 ENCOUNTER — Emergency Department (HOSPITAL_COMMUNITY): Payer: Self-pay

## 2016-01-23 ENCOUNTER — Emergency Department (HOSPITAL_COMMUNITY)
Admission: EM | Admit: 2016-01-23 | Discharge: 2016-01-23 | Disposition: A | Discharge status: Home / Self Care | Payer: Self-pay | Attending: Emergency Medicine | Admitting: Emergency Medicine

## 2016-01-23 DIAGNOSIS — I10 Essential (primary) hypertension: Secondary | ICD-10-CM | POA: Insufficient documentation

## 2016-01-23 DIAGNOSIS — J449 Chronic obstructive pulmonary disease, unspecified: Secondary | ICD-10-CM | POA: Insufficient documentation

## 2016-01-23 DIAGNOSIS — M25561 Pain in right knee: Secondary | ICD-10-CM | POA: Insufficient documentation

## 2016-01-23 DIAGNOSIS — F17210 Nicotine dependence, cigarettes, uncomplicated: Secondary | ICD-10-CM | POA: Insufficient documentation

## 2016-01-23 DIAGNOSIS — Z85828 Personal history of other malignant neoplasm of skin: Secondary | ICD-10-CM | POA: Insufficient documentation

## 2016-01-23 DIAGNOSIS — Z7982 Long term (current) use of aspirin: Secondary | ICD-10-CM | POA: Insufficient documentation

## 2016-01-23 NOTE — ED Provider Notes (Signed)
[facility] DEPT Provider Note   CSN: [id number] Arrival date & time: 01/23/16  1328  By signing my name below, I, Emmanuella Mensah, attest that this documentation has been prepared under the direction and in the presence of [employer], PA-C. Electronically Signed: Judithann Sauger, ED Scribe. 01/23/16. 3:35 PM.   History   Chief Complaint Chief Complaint  Patient presents with  . Leg Pain    HPI Comments: Katylin Okereke is a 55 y.o. female with a hx of hypertension who presents to the Emergency Department complaining of gradually worsening moderate right anterior knee pain with swelling onset 2 months ago. She reports associated pain with ambulating and one episode of vomiting yesterday which she attributes to the pain. She adds that she has been experiencing intermittent episodes where her right leg "gives out". She notes that the pain is beginning to radiate to her posterior knee and down to her shin. She denies any falls, injuries, or trauma prior to onset of her symptoms. No alleviating factors noted. Pt states that she has tried Aleve, ice, and heat PTA with no relief. She has an allergy to anesthetics and succinylcholine chloride. She denies any fever, chills, nausea, numbness/tingling, open wounds, or any other symptoms.   The history is provided by the patient. No language interpreter was used.    Past Medical History:  Diagnosis Date  . Abdominal pain, left lower quadrant 02/24/2010   pain resolved.  Marland Kitchen ANXIETY 09/21/2006  . Arthritis    thumbs   . BACK PAIN 01/12/2009  . Cancer ([place])    hx of skin cancers on face   . Chronic pancreatitis ([place]) 07/29/2012   Treatment Creon orally  . COPD (chronic obstructive pulmonary disease) ([place])   . COUGH, CHRONIC 09/21/2006   remains occ. with productive white mucus 07-19-14  . DEPRESSION 12/29/2007  . DIVERTICULOSIS, COLON 12/29/2007  . DYSPNEA 06/02/2008  . Family history of anesthesia complication    ? malignant hyerthermia-  grandfather   . FATIGUE 07/14/2008  . HEMORRHOIDS, HX OF 09/21/2006   07-19-14 no problems now.  Marland Kitchen HOARSENESS, CHRONIC 08/13/2008   remains "raspy deep voice"  . HYPERTENSION 12/29/2007   denies" takes Propranolol for tremors and heart rhythm  . Impaired glucose tolerance 10/19/2010  . Irritable bowel syndrome 09/21/2006  . Left leg numbness    resolved-not a problem 07-19-14.  . Malignant hyperthermia   . Mitral valve prolapse   . Pain    more abdominal "midline to RLQ" on 07-19-14  . 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 SORE THROAT 07/14/2008   07-19-14 no problem today.  . Wheezing 05/18/2009   remains as of 07-19-14    Patient Active Problem List   Diagnosis Date Noted  . Internal hernia s/p lap reduction/repair 07/22/2014 07/22/2014  . Ventral hernia s/p lap repair w mesh 07/22/2014 07/22/2014  . Umbilical hernia s/p lap repair w mesh 07/22/2014 07/22/2014  . Abdominal pain, right lower quadrant 08/06/2013  . Diverticulitis of large intestine with abscess s/p left colectomy 07/16/2013 07/16/2013  . 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 Diastasis recti 06/17/2013  . Tobacco abuse 05/21/2013  . Alcohol intake above recommended sensible limits with complication ([place]) [id number]  . Chronic pancreatitis ([place]) 07/29/2012  . COPD (chronic obstructive pulmonary disease) ([place]) 04/01/2012  . 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 Insomnia 04/01/2012  . Impaired glucose tolerance 10/19/2010  . BACK PAIN 01/12/2009  . HOARSENESS, CHRONIC 08/13/2008  . FATIGUE 07/14/2008  . DEPRESSION  12/29/2007  . HYPERTENSION 12/29/2007  . DIVERTICULOSIS, COLON 12/29/2007  . ANXIETY 09/21/2006  . 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 Irritable bowel syndrome 09/21/2006    Past Surgical History:  Procedure Laterality Date  . COLON RESECTION Left    "diverticulitis" 6''15  . COLONOSCOPY    . COLONOSCOPY  07/15/2013   . fractured jaw   1980   required wiring- no retained hardware  . INSERTION OF MESH  07/22/2014   Procedure: INSERTION OF MESH;  Surgeon: Michael Boston, MD;  Location: [facility] ORS;  Service: General;;  . LAPAROSCOPIC ASSISTED VENTRAL HERNIA REPAIR N/A 07/22/2014   Procedure: LAPAROSCOPIC SUPRA-UMBILICAL AND UMBILICAL HERNIA  REPAIR, REDUCTION AND REPAIRS OF INTERNAL HERNIAS LAPAROSCOPICALLY;  Surgeon: Michael Boston, MD;  Location: [facility] ORS;  Service: General;  Laterality: N/A;  . UPPER GASTROINTESTINAL ENDOSCOPY      OB History    Gravida Para Term Preterm AB Living   2 2       2    SAB TAB Ectopic Multiple Live Births                   Home Medications    Prior to Admission medications   Medication Sig Start Date End Date Taking? Authorizing Provider  ALPRAZolam Duanne Moron) 0.5 MG tablet Take 0.25 mg by mouth 3 (three) times daily as needed for anxiety.     Historical Provider, MD  aspirin EC 81 MG tablet Take 81 mg by mouth at bedtime.     Historical Provider, MD  Aspirin-Salicylamide-Caffeine (BC HEADACHE POWDER PO) Take 1 each by mouth daily as needed (pain.). Reported on 04/21/2015    Historical Provider, MD  b complex vitamins capsule Take 1 capsule by mouth at bedtime. Reported on 04/21/2015    Historical Provider, MD  cetirizine (ZYRTEC) 10 MG tablet Take 5 mg by mouth at bedtime.     Historical Provider, MD  CREON 36000 units CPEP capsule TAKE 1 CAPSULE BY MOUTH THREE TIMES DAILY WITH EACH MEAL. 03/08/15   Gatha Mayer, MD  desvenlafaxine (PRISTIQ) 100 MG 24 hr tablet Take 200 mg by mouth every morning.     Historical Provider, MD  ibuprofen (ADVIL,MOTRIN) 200 MG tablet Take 400 mg by mouth every 6 (six) hours as needed for headache or moderate pain. Reported on 04/21/2015    Historical Provider, MD  Lactobacillus (PROBIOTIC ACIDOPHILUS PO) Take 1 capsule by mouth every morning.    Historical Provider, MD  methocarbamol (ROBAXIN) 750 MG tablet Take 1 tablet (750 mg total) by mouth 4 (four) times daily as needed  (use for muscle cramps/pain). Patient not taking: Reported on 04/21/2015 08/06/13   Michael Boston, MD  metroNIDAZOLE (FLAGYL) 500 MG tablet Take 1 tablet (500 mg total) by mouth 2 (two) times daily. Patient not taking: Reported on 04/21/2015 11/25/14   Terrance Mass, MD  oxyCODONE (OXY IR/ROXICODONE) 5 MG immediate release tablet Take 1-2 tablets (5-10 mg total) by mouth every 4 (four) hours as needed for moderate pain, severe pain or breakthrough pain. Patient not taking: Reported on 04/21/2015 07/22/14   Michael Boston, MD  propranolol (INDERAL) 10 MG tablet Take 1 tablet (10 mg total) by mouth every morning. 07/10/13   Biagio Borg, MD  ranitidine (ZANTAC) 300 MG tablet TAKE 1 TABLET BY MOUTH AT BEDTIME. Patient not taking: Reported on 04/21/2015 01/12/14   Biagio Borg, MD    Family History Family History  Problem Relation Age of Onset  . Lung cancer Father   .  Prostate cancer Father   . Hypertension Mother   . COPD Mother   . Diabetes Maternal Grandfather   . Colon polyps Other     Social History Social History  Substance Use Topics  . Smoking status: Current Every Day Smoker    Packs/day: 1.50    Years: 35.00    Types: Cigarettes  . Smokeless tobacco: Never Used     Comment: 1 ppd, patient has been counseled to quit smoking 08/13/08  . Alcohol use 0.0 oz/week     Comment: social-beer 1 nightly     Allergies   Anesthetics, amide and Succinylcholine chloride   Review of Systems Review of Systems  Constitutional: Negative for chills and fever.  Gastrointestinal: Positive for vomiting. Negative for nausea.  Musculoskeletal: Positive for arthralgias and joint swelling.  Skin: Negative for wound.  Neurological: Negative for numbness.     Physical Exam Updated Vital Signs BP 148/94 (BP Location: Right Arm)   Pulse 79   Temp 98.1 F (36.7 C) (Oral)   Resp 18   Ht 5\' 6"  (1.676 m)   Wt 140 lb (63.5 kg)   LMP 06/18/2014   SpO2 98%   BMI 22.60 kg/m   Physical Exam    Constitutional: She is oriented to person, place, and time. She appears well-developed and well-nourished. No distress.  HENT:  Head: Normocephalic and atraumatic.  Eyes: Conjunctivae and EOM are normal.  Neck: Neck supple.  Cardiovascular: Normal rate.   Pulmonary/Chest: Effort normal. No respiratory distress.  Musculoskeletal: Normal range of motion.  Right knee: No obvious swelling or deformity. Diffuse tenderness to palpation. Decreased ROM of knee. N/V intact. Ambulatory   Neurological: She is alert and oriented to person, place, and time.  Skin: Skin is warm and dry.  Psychiatric: She has a normal mood and affect. Her behavior is normal.  Nursing note and vitals reviewed.    ED Treatments / Results  DIAGNOSTIC STUDIES: Oxygen Saturation is 98% on RA, normal by my interpretation.    COORDINATION OF CARE: 3:31 PM- Pt advised of plan for treatment and pt agrees. Discussed RICE method and advised pt to continue Aleve use for pain. Will provide resources for Ortho follow up.    Labs (all labs ordered are listed, but only abnormal results are displayed) Labs Reviewed - No data to display  EKG  EKG Interpretation None       Radiology Dg Knee Complete 4 Views Right  Result Date: 01/23/2016 CLINICAL DATA:  Generalized right knee pain for 3 months. No reported injury. EXAM: RIGHT KNEE - COMPLETE 4+ VIEW COMPARISON:  None. FINDINGS: Moderate suprapatellar right knee joint effusion. Small enthesophyte at the inferior right patella. No fracture, dislocation, suspicious focal osseous lesion or appreciable degenerative or erosive arthropathy. No radiopaque foreign body. IMPRESSION: Moderate suprapatellar right knee joint effusion. No fracture, malalignment or appreciable degenerative arthropathy. Electronically Signed   By: Ilona Sorrel M.D.   On: 01/23/2016 15:23    Procedures Procedures (including critical care time)  Medications Ordered in ED Medications - No data to  display   Initial Impression / Assessment and Plan / ED Course  Janetta Hora, PA-C has reviewed the triage vital signs and the nursing notes.  Pertinent labs & imaging results that were available during my care of the patient were reviewed by me and considered in my medical decision making (see chart for details).  Clinical Course    Patient X-Ray negative for obvious fracture or dislocation. Pain managed in ED.  Pt advised to follow up with orthopedics if symptoms persist for possibility of missed fracture diagnosis. Patient given brace while in ED, conservative therapy recommended and discussed. Patient will be dc home & is agreeable with above plan.  Final Clinical Impressions(s) / ED Diagnoses   Final diagnoses:  Right knee pain, unspecified chronicity    New Prescriptions Discharge Medication List as of 01/23/2016  3:37 PM     I personally performed the services described in this documentation, which was scribed in my presence. The recorded information has been reviewed and is accurate.    Recardo Evangelist, PA-C 01/30/16 1134    Leo Grosser, MD 02/01/16 [id number]

## 2016-01-23 NOTE — ED Notes (Signed)
Patient transported to X-ray 

## 2016-01-23 NOTE — ED Triage Notes (Addendum)
Pt reports right knee pain radiating into her shin X2 months. She reports the pain has gotten gradually worse and is now causing her to be nauseous. Pt denies injury or long travel.

## 2016-01-23 NOTE — Discharge Instructions (Signed)
Ice - ice for 20 minutes at a time, several times a day Compression - wear brace to provide support Elevate - elevate knee above level of heart Ibuprofen - take with food. Take up to 3-4 times daily Follow up with Orthopedics

## 2016-01-23 NOTE — ED Notes (Signed)
Pt is in stable condition upon d/c and ambulates from ED. 

## 2016-01-26 ENCOUNTER — Telehealth: Payer: Self-pay | Admitting: Internal Medicine

## 2016-01-26 MED ORDER — PANCRELIPASE (LIP-PROT-AMYL) 36000-114000 UNITS PO CPEP: ORAL_CAPSULE | ORAL | 0 refills | Status: DC

## 2016-01-26 NOTE — Telephone Encounter (Signed)
Spoke to [place] and we have made her an appointment with Dr Carlean Purl for 02/16/16.  I was able to use a 50 free capsule voucher at [facility] on [address]. She will bring the patient assistance forms for [place] to work on. She was very appreciative of my help.  Richardson Landry our creon rep called me back and he said he doesn't have a lot of the 36,000 sample strength and that the assistance is the best way to proceed.

## 2016-02-15 ENCOUNTER — Other Ambulatory Visit: Payer: Self-pay | Admitting: Internal Medicine

## 2016-02-16 ENCOUNTER — Encounter: Payer: Self-pay | Admitting: Internal Medicine

## 2016-02-16 ENCOUNTER — Ambulatory Visit (INDEPENDENT_AMBULATORY_CARE_PROVIDER_SITE_OTHER): Payer: Self-pay | Admitting: Internal Medicine

## 2016-02-16 VITALS — BP 106/70 | HR 84 | Ht 64.0 in | Wt 152.2 lb

## 2016-02-16 DIAGNOSIS — K860 Alcohol-induced chronic pancreatitis: Secondary | ICD-10-CM

## 2016-02-16 NOTE — Patient Instructions (Signed)
   We are going to work on your assistance forms to get you your creon.     Follow up as needed with Dr Carlean Purl.      I appreciate the opportunity to care for you. Ronney Lion, [facility]

## 2016-02-16 NOTE — Progress Notes (Signed)
Tracey Fields 56 y.o. 1960-06-28 [id number]  Assessment & Plan:   Encounter Diagnosis  Name Primary?  . 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  Will refill Creon today.  Encouraged to continue to reduce consumption of alcohol.   Carlena Hurl, PA-S I have seen the patient w/ Ms. [place] [facility] and she has served as a Education administrator. Tracey Mayer, MD, [facility]al    Subjective:   Chief Complaint: Chronic pancreatitis follow-up and med refill  HPI 56 year old female presenting with hx of chronic pancreatitis s/p hemicolectomy due to diverticulitis.  She has been taking her Creon three times daily.  She has occasional nausea and vomiting associated with some epigastric discomfort.  She states she has several loose BM's a day, no blood in the stool.    Current Meds  Medication Sig  . ALPRAZolam (XANAX) 0.5 MG tablet Take 0.25 mg by mouth 3 (three) times daily as needed for anxiety.   Marland Kitchen aspirin EC 81 MG tablet Take 81 mg by mouth at bedtime.   . Aspirin-Salicylamide-Caffeine (BC HEADACHE POWDER PO) Take 1 each by mouth daily as needed (pain.). Reported on 04/21/2015  . cetirizine (ZYRTEC) 10 MG tablet Take 5 mg by mouth at bedtime.   Marland Kitchen desvenlafaxine (PRISTIQ) 100 MG 24 hr tablet Take 200 mg by mouth every morning.   Marland Kitchen ibuprofen (ADVIL,MOTRIN) 200 MG tablet Take 400 mg by mouth every 6 (six) hours as needed for headache or moderate pain. Reported on 04/21/2015  . Lactobacillus (PROBIOTIC ACIDOPHILUS PO) Take 1 capsule by mouth every morning.  . lipase/protease/amylase (CREON) 36000 UNITS CPEP capsule TAKE 1 CAPSULE BY MOUTH THREE TIMES DAILY WITH EACH MEAL.  Marland Kitchen propranolol (INDERAL) 10 MG tablet Take 1 tablet (10 mg total) by mouth every morning.    Allergies  Allergen Reactions  . Anesthetics, Amide Other (See Comments)    System shut down when 56 years old having jaw work done  . Succinylcholine Chloride Other (See Comments)    Major organs stopped working    Past Medical History:  Diagnosis Date  . Abdominal pain, left lower quadrant 02/24/2010   pain resolved.  Marland Kitchen ANXIETY 09/21/2006  . Arthritis    thumbs   . BACK PAIN 01/12/2009  . Cancer ([place])    hx of skin cancers on face   . Chronic pancreatitis ([place]) 07/29/2012   Treatment Creon orally  . COPD (chronic obstructive pulmonary disease) ([place])   . COUGH, CHRONIC 09/21/2006   remains occ. with productive white mucus 07-19-14  . DEPRESSION 12/29/2007  . DIVERTICULOSIS, COLON 12/29/2007  . DYSPNEA 06/02/2008  . Family history of anesthesia complication    ? malignant hyerthermia- grandfather   . FATIGUE 07/14/2008  . HEMORRHOIDS, HX OF 09/21/2006   07-19-14 no problems now.  Marland Kitchen HOARSENESS, CHRONIC 08/13/2008   remains "raspy deep voice"  . HYPERTENSION 12/29/2007   denies" takes Propranolol for tremors and heart rhythm  . Impaired glucose tolerance 10/19/2010  . Irritable bowel syndrome 09/21/2006  . Left leg numbness    resolved-not a problem 07-19-14.  . Malignant hyperthermia   . Mitral valve prolapse   . Pain    more abdominal "midline to RLQ" on 07-19-14  . 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 SORE THROAT 07/14/2008   07-19-14 no problem today.  . Wheezing 05/18/2009   remains as of 07-19-14    Past Surgical History:  Procedure Laterality Date  .  COLON RESECTION Left    "diverticulitis" 6''15  . COLONOSCOPY    . COLONOSCOPY  07/15/2013   . fractured jaw  1980   required wiring- no retained hardware  . INSERTION OF MESH  07/22/2014   Procedure: INSERTION OF MESH;  Surgeon: Michael Boston, MD;  Location: [facility];  Service: General;;  . LAPAROSCOPIC ASSISTED VENTRAL HERNIA REPAIR N/A 07/22/2014   Procedure: LAPAROSCOPIC SUPRA-UMBILICAL AND UMBILICAL HERNIA  REPAIR, REDUCTION AND REPAIRS OF INTERNAL HERNIAS LAPAROSCOPICALLY;  Surgeon: Michael Boston, MD;  Location: [facility];  Service: General;  Laterality: N/A;   . UPPER GASTROINTESTINAL ENDOSCOPY     Social History   Social History  . Marital status: Divorced    Spouse name: N/A  . Number of children: 2  . Years of education: N/A   Occupational History  . independent Medical sales representative   Social History Main Topics  . Smoking status: Current Every Day Smoker    Packs/day: 1.50    Years: 35.00    Types: Cigarettes  . Smokeless tobacco: Never Used     Comment: 1 ppd, patient has been counseled to quit smoking 08/13/08  . Alcohol use 0.0 oz/week     Comment: social-beer 1 nightly  . Drug use: No  . Sexual activity: Yes   Other Topics Concern  . Not on file   Social History Narrative  . No narrative on file    Review of Systems All other ROS negative    Objective:   Physical Exam BP 106/70 (BP Location: Left Arm, Patient Position: Sitting, Cuff Size: Normal)   Pulse 84   Ht 5\' 4"  (1.626 m) Comment: height measured without shoes  Wt 152 lb 4 oz (69.1 kg)   LMP 06/18/2014   BMI 26.13 kg/m   GA: Well developed, well nourished female in NAD Abdomen: Normoactive BS. Soft, non-distended.  Mild bulge upper midline abdominal wall  present with some tenderness to palpation. No clear hernia.

## 2016-02-28 ENCOUNTER — Telehealth: Payer: Self-pay | Admitting: Internal Medicine

## 2016-02-28 NOTE — Telephone Encounter (Signed)
Left her a detailed message that I spoke with Darrick Penna at [facility] and she said they are still working on her application but hopefully it will be done by tomorrow.  The # to reach Dina Rich is [phone].

## 2016-02-29 ENCOUNTER — Telehealth: Payer: Self-pay | Admitting: Internal Medicine

## 2016-02-29 ENCOUNTER — Other Ambulatory Visit: Payer: Self-pay | Admitting: Internal Medicine

## 2016-02-29 NOTE — Telephone Encounter (Signed)
Creon refilled as requested and she should get her patient assistance shipment in the next 3-4 days.  Patient aware.

## 2016-02-29 NOTE — Telephone Encounter (Signed)
Spoke with Phillips Odor at [facility] and he said she is approved for the creon thru 02/28/2017 and she should get her first shipment in 3-4 days.  She has to call every 3 weeks for refills.  I notified Tahani of all this information.  She's been out of town, he mom passed away. She does want me to send in a refill to the pharmacy since she is currently out.

## 2016-03-23 ENCOUNTER — Other Ambulatory Visit: Payer: Self-pay | Admitting: Gynecology

## 2016-03-23 DIAGNOSIS — Z1231 Encounter for screening mammogram for malignant neoplasm of breast: Secondary | ICD-10-CM

## 2016-06-20 ENCOUNTER — Encounter: Payer: Self-pay | Admitting: Gynecology

## 2016-07-24 ENCOUNTER — Ambulatory Visit
Admission: RE | Admit: 2016-07-24 | Discharge: 2016-07-24 | Disposition: A | Discharge status: Home / Self Care | Payer: No Typology Code available for payment source | Source: Ambulatory Visit | Attending: Gynecology | Admitting: Gynecology

## 2016-07-24 DIAGNOSIS — Z1231 Encounter for screening mammogram for malignant neoplasm of breast: Secondary | ICD-10-CM

## 2017-04-03 ENCOUNTER — Telehealth: Payer: Self-pay

## 2017-04-03 NOTE — Telephone Encounter (Signed)
We have received a Creon patient assistance application from [employer] that the patient filled out her part and mailed to [place]. Dr Carlean Purl has signed it and I will get the information needed to complete it hopefully tomorrow.

## 2017-04-04 NOTE — Telephone Encounter (Signed)
Patient calling to check on status of her application. Please call back at [phone].

## 2017-04-04 NOTE — Telephone Encounter (Signed)
I left her a message to call me back so I can see if she wants me to fax this or did she need it back to mail in.

## 2017-04-05 NOTE — Telephone Encounter (Signed)
Left her another message to call us back.

## 2017-04-08 NOTE — Telephone Encounter (Signed)
I sent forms to be scanned into epic.

## 2017-04-08 NOTE — Telephone Encounter (Signed)
I spoke with [facility] and she said to fax the [employer] patient assistance application to [phone]. I got confirmation that it went thru.

## 2017-04-08 NOTE — Telephone Encounter (Signed)
Pt said she is returning your call °

## 2017-04-16 ENCOUNTER — Telehealth: Payer: Self-pay

## 2017-04-16 NOTE — Telephone Encounter (Signed)
We received a letter stating patient has been approved for temporary assistance for her creon 36000 mg tablets, good thru  04/12/2018. They want her to apply for the states Medicaid program and if denied she must send them the denial letter. Letter sent to be scanned into epic. I also called and left her a detailed message about this. The Dina Rich foundation is sending her the same letter we got.

## 2017-12-18 ENCOUNTER — Telehealth: Payer: Self-pay | Admitting: Psychiatry

## 2017-12-18 NOTE — Telephone Encounter (Signed)
NEED REFILL ON AL[facility]AZALAM SEND [facility] [place]

## 2017-12-18 NOTE — Telephone Encounter (Signed)
Called into pharmacy documented in paper chart, not abstracted yet

## 2017-12-19 IMAGING — MG MM DIAG BREAST TOMO UNI RIGHT
6 series · 6 of 14 positions shown · non-contrast
Comparison: 04/11/2015 and earlier priors

ADDENDUM:
This report is created to correct a typographical error in the
findings section of the original report and to correct an error in
the final BI-RADS category.

The findings section should read as follows:
CLINICAL DATA: Possible asymmetry and possible distortion
identified on one view of the right breast from the recent 2D
screening mammogram.
EXAM:
DIGITAL DIAGNOSTIC RIGHT MAMMOGRAM WITH 3D TOMOSYNTHESIS AND CAD

[R CC synth-2D]
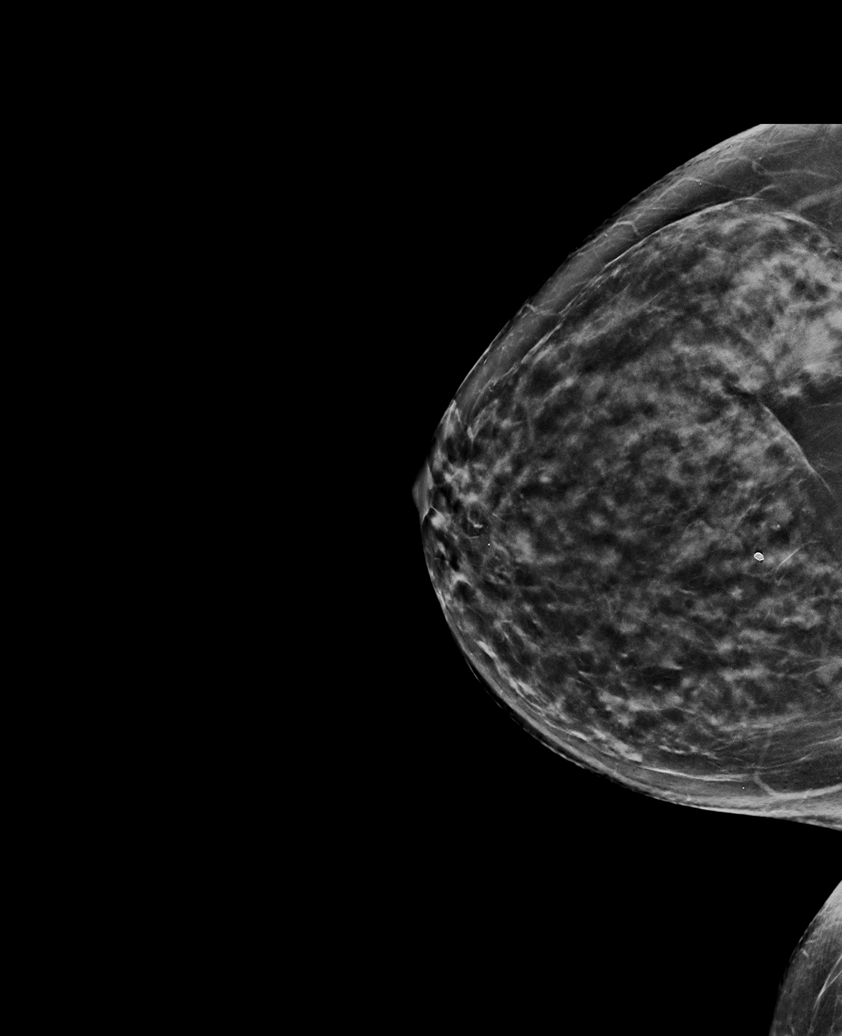

[R MLO synth-2D]
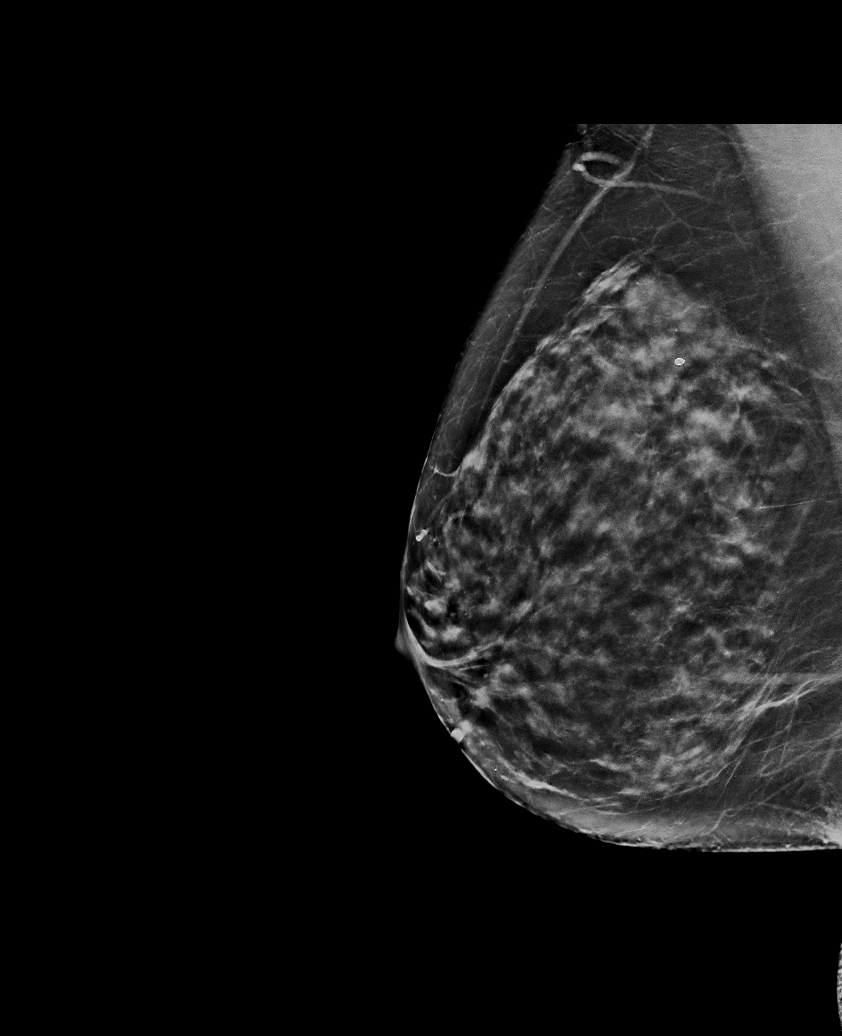

[R MLO]
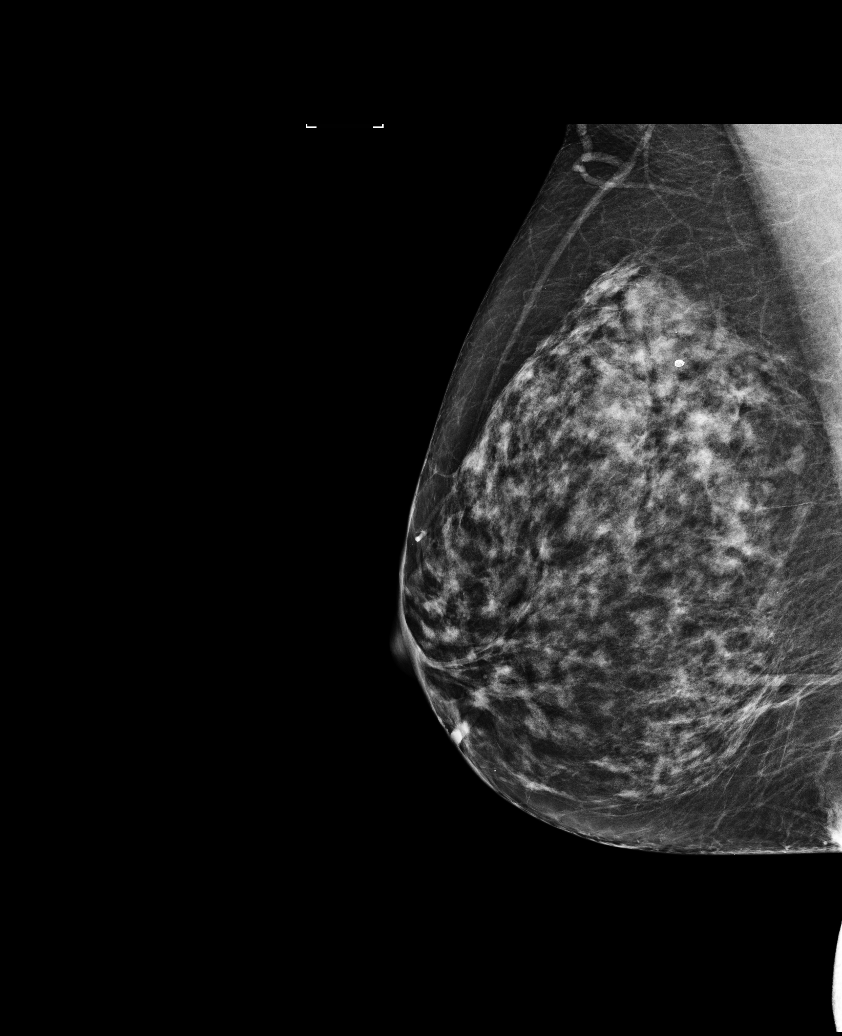

[R CC]
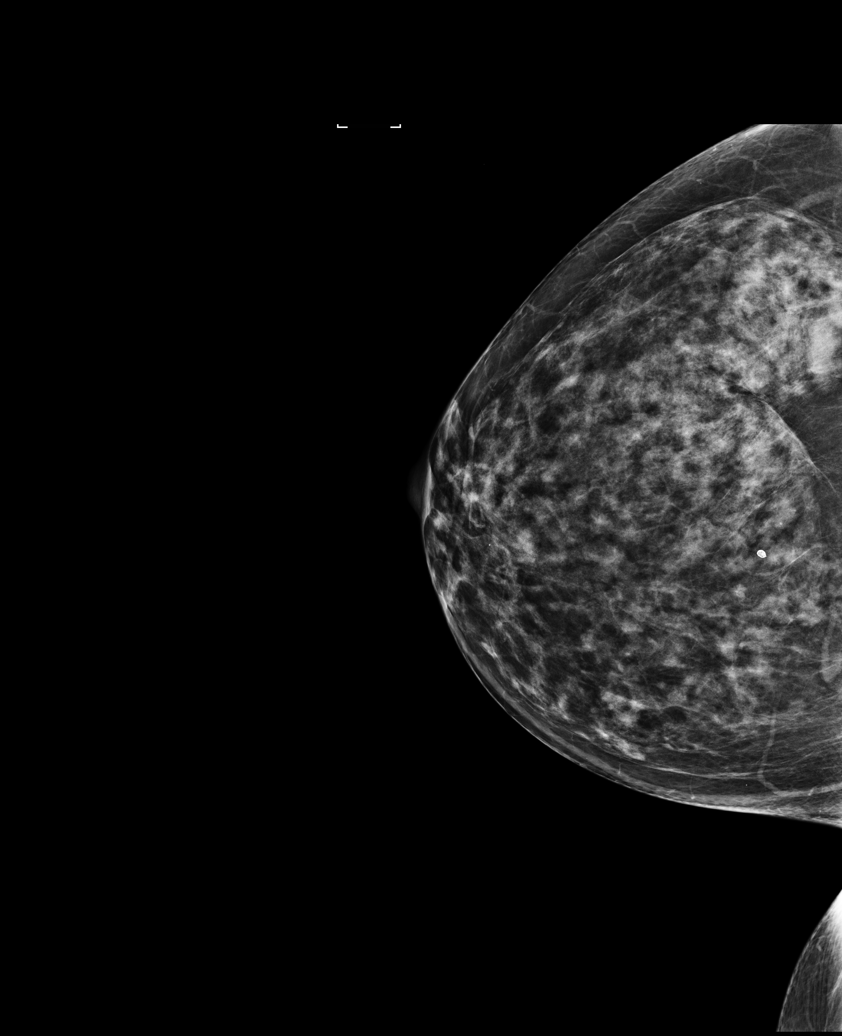

[R CC tomo · tomo slice 34/67.0]
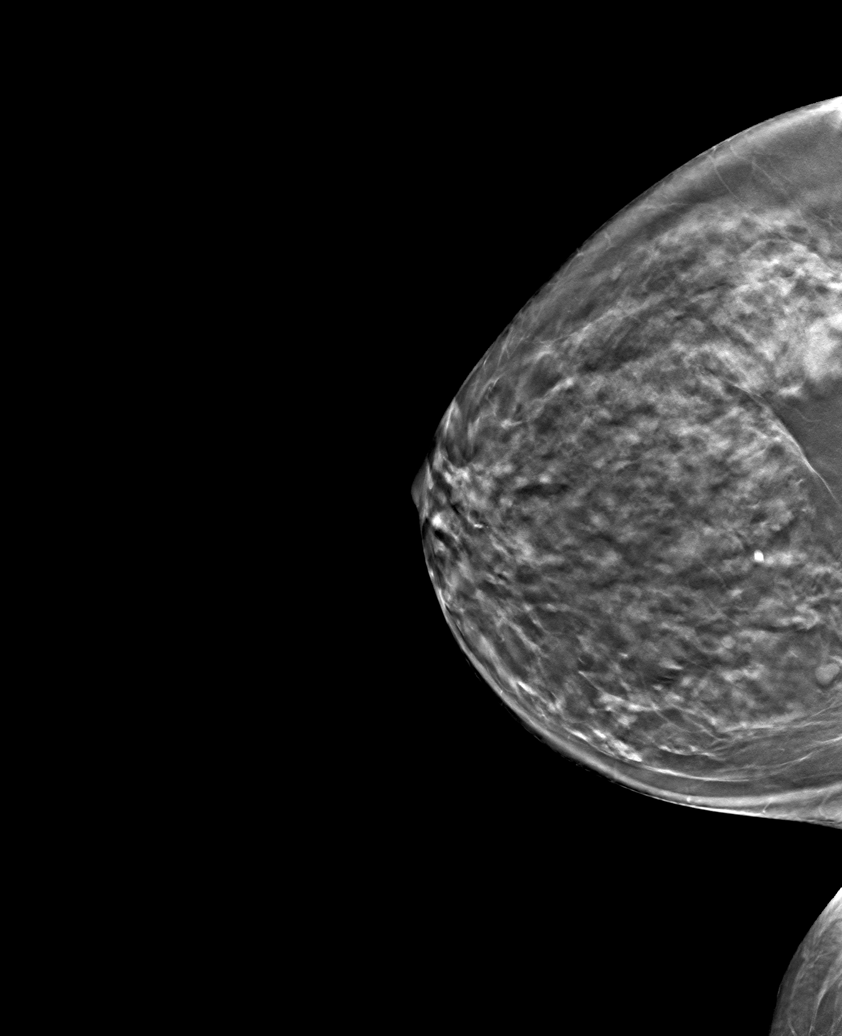

[R MLO tomo · tomo slice 35/70.0]
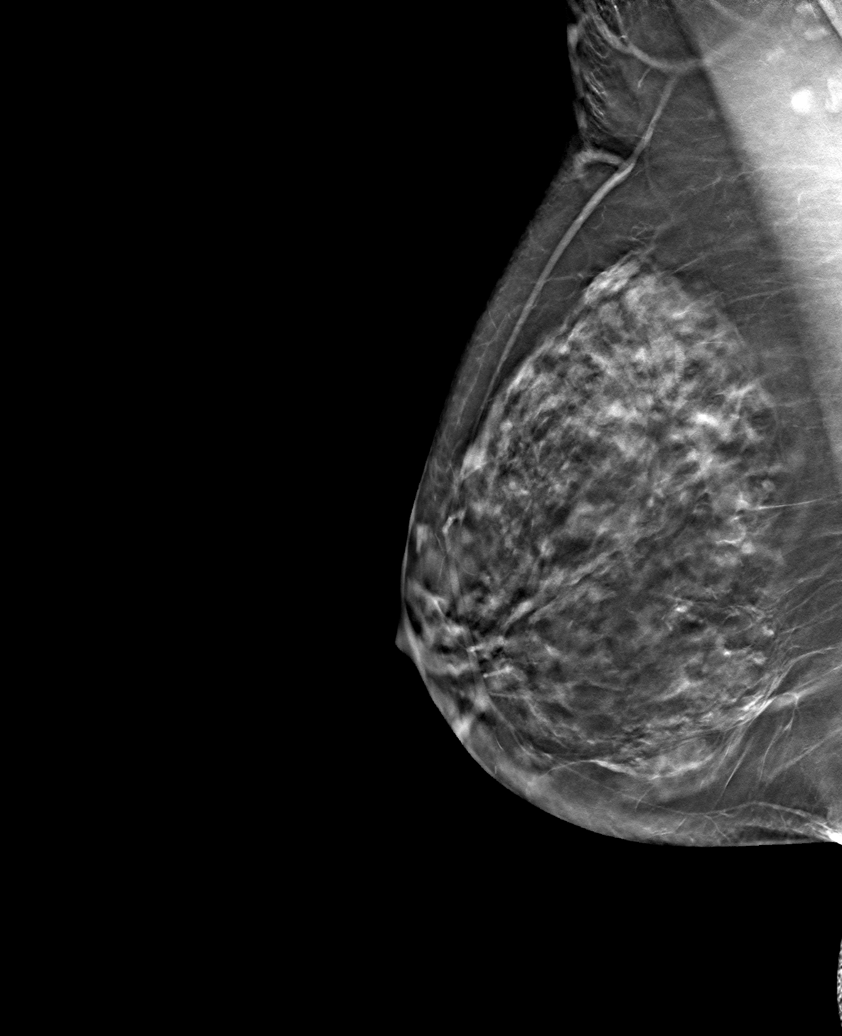

[6 of 14 positions shown; findings below may reference images not displayed]

FINDINGS: Whole breast 3D images in the CC and MLO projections of
the right breast are negative. There is no suspicious mass,
distortion, or persistent asymmetry. No findings to suggest
malignancy.

The correct BI-RADS category is below:

BI-RADS CATEGORY 1: Negative.
ACR Breast Density Category c: The breast tissue is heterogeneously
dense, which may obscure small masses.
FINDINGS: Whole breast 3D images in the CC and MLO projections the right
breast are negative. There is an 0 suspicious mass, distortion, or
persistent asymmetry. No findings to suggest malignancy.

Mammographic images were processed with CAD.
IMPRESSION: No evidence of malignancy in the right breast.

RECOMMENDATION:
Screening mammogram in one year.(Code:YP-[id number])

I have discussed the findings and recommendations with the patient.
Results were also provided in writing at the conclusion of the
visit. If applicable, a reminder letter will be sent to the patient
regarding the next appointment.

BI-RADS CATEGORY  0: Incomplete. Need additional imaging evaluation
and/or prior mammograms for comparison. [DATE]

## 2017-12-27 ENCOUNTER — Telehealth: Payer: Self-pay | Admitting: Internal Medicine

## 2017-12-27 MED ORDER — PANCRELIPASE (LIP-PROT-AMYL) 36000-114000 UNITS PO CPEP: ORAL_CAPSULE | ORAL | 1 refills | Status: DC

## 2017-12-27 NOTE — Telephone Encounter (Signed)
Patient states Tracey [facility] assistance will not refill her creon medication due to needing updated information on Dr.Gessner. Patient would like a call back to know if anything can or has been done.

## 2017-12-27 NOTE — Telephone Encounter (Signed)
I called the [employer] program at [phone] and they said they just need an updated creon rx . She is approved thru 04/2018. Rx faxed to [phone] and patient informed.

## 2018-02-27 ENCOUNTER — Telehealth: Payer: Self-pay

## 2018-02-27 NOTE — Telephone Encounter (Signed)
I received a fax that her patient assistance for her creon is going to expire in 60 days. They are mailing her a form and once she completes her part and gets it to [place] to do our part we will resubmit it to [facility]. I gave her their phone # to call to made sure they have her updated address. Phone # [phone].

## 2018-03-10 ENCOUNTER — Other Ambulatory Visit: Payer: Self-pay | Admitting: Psychiatry

## 2018-03-11 ENCOUNTER — Other Ambulatory Visit: Payer: Self-pay

## 2018-03-11 ENCOUNTER — Telehealth: Payer: Self-pay | Admitting: Psychiatry

## 2018-03-11 MED ORDER — DESVENLAFAXINE SUCCINATE ER 100 MG PO TB24: 200.0000 mg | ORAL_TABLET | Freq: Every morning | ORAL | 0 refills | Status: DC

## 2018-03-11 NOTE — Telephone Encounter (Signed)
Patient called and said that she needs prestiq 100 mg [facility] . We have sent in script pt. Assistance so she needs some until she gets her order. Please esc[facility] [place] [place] [address] [phone]

## 2018-03-11 NOTE — Telephone Encounter (Signed)
[facility] [place] [place]

## 2018-04-04 ENCOUNTER — Other Ambulatory Visit: Payer: Self-pay | Admitting: Psychiatry

## 2018-04-04 NOTE — Telephone Encounter (Signed)
Need to review paper chart not seen in epic yet  

## 2018-05-02 ENCOUNTER — Other Ambulatory Visit: Payer: Self-pay | Admitting: Psychiatry

## 2018-05-09 ENCOUNTER — Other Ambulatory Visit: Payer: Self-pay

## 2018-05-09 MED ORDER — PROPRANOLOL HCL 10 MG PO TABS: 10.0000 mg | ORAL_TABLET | Freq: Three times a day (TID) | ORAL | 0 refills | Status: DC

## 2018-05-23 ENCOUNTER — Other Ambulatory Visit: Payer: Self-pay

## 2018-05-23 MED ORDER — PANCRELIPASE (LIP-PROT-AMYL) 36000-114000 UNITS PO CPEP: ORAL_CAPSULE | ORAL | 1 refills | Status: AC

## 2018-06-18 ENCOUNTER — Other Ambulatory Visit: Payer: Self-pay

## 2018-06-18 MED ORDER — ALPRAZOLAM 0.5 MG PO TABS: 0.2500 mg | ORAL_TABLET | Freq: Three times a day (TID) | ORAL | 0 refills | Status: DC | PRN

## 2018-06-20 ENCOUNTER — Telehealth: Payer: Self-pay | Admitting: Internal Medicine

## 2018-06-23 ENCOUNTER — Ambulatory Visit (INDEPENDENT_AMBULATORY_CARE_PROVIDER_SITE_OTHER): Payer: Medicaid Other | Admitting: Psychiatry

## 2018-06-23 ENCOUNTER — Other Ambulatory Visit: Payer: Self-pay

## 2018-06-23 ENCOUNTER — Encounter: Payer: Self-pay | Admitting: Psychiatry

## 2018-06-23 DIAGNOSIS — F411 Generalized anxiety disorder: Secondary | ICD-10-CM

## 2018-06-23 DIAGNOSIS — F331 Major depressive disorder, recurrent, moderate: Secondary | ICD-10-CM

## 2018-06-23 MED ORDER — ALPRAZOLAM 0.5 MG PO TABS: 0.2500 mg | ORAL_TABLET | Freq: Three times a day (TID) | ORAL | 0 refills | Status: DC | PRN

## 2018-06-23 NOTE — Telephone Encounter (Signed)
Put the paperwork in the mail to her today.

## 2018-06-23 NOTE — Progress Notes (Signed)
Tracey Fields [id number] [birth year] 58 y.o.  Virtual Visit via Telephone Note  I connected with@ on 06/23/18 at  4:00 PM EDT by telephone and verified that I am speaking with the correct person using two identifiers.   I discussed the limitations, risks, security and privacy concerns of performing an evaluation and management service by telephone and the availability of in person appointments. I also discussed with the patient that there may be a patient responsible charge related to this service. The patient expressed understanding and agreed to proceed.   I discussed the assessment and treatment plan with the patient. The patient was provided an opportunity to ask questions and all were answered. The patient agreed with the plan and demonstrated an understanding of the instructions.   The patient was advised to call back or seek an in-person evaluation if the symptoms worsen or if the condition fails to improve as anticipated.  I provided for 20 minutes of non-face-to-face time during this encounter.  The patient was located at home.  The provider was located at home.   Purnell Shoemaker, MD   Subjective:   Patient ID:  Tracey Fields is a 58 y.o. (DOB 1960-06-06) female.  Chief Complaint:  Chief Complaint  Patient presents with  . Anxiety    Medication management  . Depression    Medication management    HPI Chancy Claros presents for follow-up of depression and anxiety.  Last visit March 2019.  She was doing well on Pristiq 200 mg and we did not change medications.  When she has been forced to alternatives she has relapsed every time.  Doing good.  Moved to [facility].  Moved to [place].  Happy to be there.  Under less stress than she used to be.   Patient reports stable mood and denies depressed or irritable moods.  Patient denies any recent difficulty with anxiety.  Patient denies difficulty with sleep initiation or maintenance. Denies appetite disturbance.  Patient reports that  energy and motivation have been good.  Patient denies any difficulty with concentration.  Patient denies any suicidal ideation.  Recent break up.   Use Xanax and the round ones are less effective than what she had before.  New generic is less effective Actavis/Teva.   Normally takes a half at night and another half once or twice in the day bc gets shaky with rapid pulse.   Other manufacturer PAR PHAMA works better.  Also cannot break the Actavis and needs to do so.  Past Psychiatric Medication Trials: Steeg 200, Xanax 0.5 mg 3 times daily as needed, propranolol 10 mg, temazepam amnesia, trazodone no response, mirtazapine, clonazepam, buspirone, citalopram 40 no response, Wellbutrin, fluoxetine, lithium, venlafaxine, Ambien  Review of Systems:  Review of Systems  Neurological: Negative for tremors and weakness.    Medications: I have reviewed the patient's current medications.  Current Outpatient Medications  Medication Sig Dispense Refill  . ALPRAZolam (XANAX) 0.5 MG tablet Take 0.5 tablets (0.25 mg total) by mouth 3 (three) times daily as needed for anxiety. 90 tablet 0  . aspirin EC 81 MG tablet Take 81 mg by mouth at bedtime.     . Aspirin-Salicylamide-Caffeine (BC HEADACHE POWDER PO) Take 1 each by mouth daily as needed (pain.). Reported on 04/21/2015    . cetirizine (ZYRTEC) 10 MG tablet Take 5 mg by mouth at bedtime.     Marland Kitchen desvenlafaxine (PRISTIQ) 100 MG 24 hr tablet TAKE 2 TABLETS (200 MG TOTAL) BY MOUTH EVERY MORNING. [place]  tablet 0  . ibuprofen (ADVIL,MOTRIN) 200 MG tablet Take 400 mg by mouth every 6 (six) hours as needed for headache or moderate pain. Reported on 04/21/2015    . lipase/protease/amylase (CREON) 36000 UNITS CPEP capsule TAKE ONE CAPSULE BY MOUTH 3 TIMES A DAY WITH MEALS 100 capsule 1  . omeprazole (PRILOSEC) 40 MG capsule     . propranolol (INDERAL) 10 MG tablet Take 1 tablet (10 mg total) by mouth 3 (three) times daily. 270 tablet 0  . triamcinolone cream (KENALOG) 0.1  %      No current facility-administered medications for this visit.     Medication Side Effects: None  Allergies:  Allergies  Allergen Reactions  . Anesthetics, Amide Other (See Comments)    System shut down when 58 years old having jaw work done  . Succinylcholine Chloride Other (See Comments)    Major organs stopped working    Past Medical History:  Diagnosis Date  . Abdominal pain, left lower quadrant 02/24/2010   pain resolved.  Marland Kitchen ANXIETY 09/21/2006  . Arthritis    thumbs   . BACK PAIN 01/12/2009  . Cancer ([place])    hx of skin cancers on face   . Chronic pancreatitis ([place]) 07/29/2012   Treatment Creon orally  . COPD (chronic obstructive pulmonary disease) ([place])   . COUGH, CHRONIC 09/21/2006   remains occ. with productive white mucus 07-19-14  . DEPRESSION 12/29/2007  . DIVERTICULOSIS, COLON 12/29/2007  . DYSPNEA 06/02/2008  . Family history of anesthesia complication    ? malignant hyerthermia- grandfather   . FATIGUE 07/14/2008  . HEMORRHOIDS, HX OF 09/21/2006   07-19-14 no problems now.  Marland Kitchen HOARSENESS, CHRONIC 08/13/2008   remains "raspy deep voice"  . HYPERTENSION 12/29/2007   denies" takes Propranolol for tremors and heart rhythm  . Impaired glucose tolerance 10/19/2010  . Irritable bowel syndrome 09/21/2006  . Left leg numbness    resolved-not a problem 07-19-14.  . Malignant hyperthermia   . Mitral valve prolapse   . Pain    more abdominal "midline to RLQ" on 07-19-14  . 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 SORE THROAT 07/14/2008   07-19-14 no problem today.  . Wheezing 05/18/2009   remains as of 07-19-14    Family History  Problem Relation Age of Onset  . Lung cancer Father   . Prostate cancer Father   . Breast cancer Father   . Hypertension Mother   . COPD Mother   . Diabetes Maternal Grandfather   . Colon polyps Other     Social History   Socioeconomic History  . Marital status: Divorced    Spouse  name: Not on file  . Number of children: 2  . Years of education: Not on file  . Highest education level: Not on file  Occupational History  . Occupation: independent Marine scientist: [employer]  Social Needs  . Financial resource strain: Not on file  . Food insecurity:    Worry: Not on file    Inability: Not on file  . Transportation needs:    Medical: Not on file    Non-medical: Not on file  Tobacco Use  . Smoking status: Current Every Day Smoker    Packs/day: 1.50    Years: 35.00    Pack years: 52.50    Types: Cigarettes  . Smokeless tobacco: Never Used  . Tobacco comment: 1 ppd, patient has been counseled to quit smoking 08/13/08  Substance  and Sexual Activity  . Alcohol use: Yes    Alcohol/week: 0.0 standard drinks    Comment: social-beer 1 nightly  . Drug use: No  . Sexual activity: Yes  Lifestyle  . Physical activity:    Days per week: Not on file    Minutes per session: Not on file  . Stress: Not on file  Relationships  . Social connections:    Talks on phone: Not on file    Gets together: Not on file    Attends religious service: Not on file    Active member of club or organization: Not on file    Attends meetings of clubs or organizations: Not on file    Relationship status: Not on file  . Intimate partner violence:    Fear of current or ex partner: Not on file    Emotionally abused: Not on file    Physically abused: Not on file    Forced sexual activity: Not on file  Other Topics Concern  . Not on file  Social History Narrative  . Not on file    Past Medical History, Surgical history, Social history, and Family history were reviewed and updated as appropriate.   Please see review of systems for further details on the patient's review from today.   Objective:   Physical Exam:  LMP 06/18/2014   Physical Exam Neurological:     Mental Status: She is alert and oriented to person, place, and time.     Cranial Nerves: No  dysarthria.  Psychiatric:        Attention and Perception: Attention normal.        Mood and Affect: Mood is anxious. Mood is not depressed.        Speech: Speech normal.        Behavior: Behavior is cooperative.        Thought Content: Thought content normal. Thought content is not paranoid or delusional. Thought content does not include homicidal or suicidal ideation. Thought content does not include homicidal or suicidal plan.        Cognition and Memory: Cognition and memory normal.        Judgment: Judgment normal.     Lab Review:     Component Value Date/Time   NA 142 10/21/2014 1016   K 4.5 10/21/2014 1016   CL 104 10/21/2014 1016   CO2 29 10/21/2014 1016   GLUCOSE 116 (H) 10/21/2014 1016   BUN 11 10/21/2014 1016   CREATININE 0.83 10/21/2014 1016   CALCIUM 9.8 10/21/2014 1016   PROT 6.6 10/21/2014 1016   ALBUMIN 4.2 10/21/2014 1016   AST 19 10/21/2014 1016   ALT 18 10/21/2014 1016   ALKPHOS 84 10/21/2014 1016   BILITOT 0.4 10/21/2014 1016   GFRNONAA >60 07/19/2014 1504   GFRAA >60 07/19/2014 1504       Component Value Date/Time   WBC 6.0 10/21/2014 1016   RBC 4.41 10/21/2014 1016   HGB 13.6 10/21/2014 1016   HCT 39.3 10/21/2014 1016   PLT 261 10/21/2014 1016   MCV 89.1 10/21/2014 1016   MCH 30.8 10/21/2014 1016   MCHC 34.6 10/21/2014 1016   RDW 14.7 10/21/2014 1016   LYMPHSABS 2.2 10/21/2014 1016   MONOABS 0.5 10/21/2014 1016   EOSABS 0.1 10/21/2014 1016   BASOSABS 0.0 10/21/2014 1016    No results found for: POCLITH, LITHIUM   No results found for: PHENYTOIN, PHENOBARB, VALPROATE, CBMZ   .res Assessment: Plan:    Major depressive  disorder, recurrent episode, moderate ([place])  Generalized anxiety disorder   Holliday has had relapses in the past when she tried to stop antidepressants.  However she is less stressed [place] and feels like she may have opportunity to come off the medication and would like to try again after she gets over  the stress of a recent break-up.  Discussed SSRI withdrawal because she has had that in the past when she tried to go off of Effexor.  She will contact us for specific instructions before trying to do so this time.  She needs the Xanax PAN as needed for mild panic episodes.  She finds the Activa's generic to be less effective than PAR PHARMA generic alprazolam.  I will order her to receive any generic except Actavis.  She has been stable generally for the past few years and is on she is not going to change medication we can wait up to a year for follow-up.  If she changes medication she will need to come in sooner.  Follow-up 1 year  Lynder Parents MD, DFAPA  Please see After Visit Summary for patient specific instructions.  No future appointments.  No orders of the defined types were placed in this encounter.     -------------------------------

## 2018-07-04 ENCOUNTER — Ambulatory Visit: Payer: No Typology Code available for payment source | Admitting: Internal Medicine

## 2018-07-28 ENCOUNTER — Other Ambulatory Visit: Payer: Self-pay | Admitting: Psychiatry

## 2018-07-29 ENCOUNTER — Other Ambulatory Visit: Payer: Self-pay

## 2018-07-29 ENCOUNTER — Telehealth: Payer: Self-pay | Admitting: Psychiatry

## 2018-07-29 MED ORDER — ALPRAZOLAM 0.5 MG PO TABS: 0.2500 mg | ORAL_TABLET | Freq: Three times a day (TID) | ORAL | 5 refills | Status: DC | PRN

## 2018-07-29 MED ORDER — DESVENLAFAXINE SUCCINATE ER 100 MG PO TB24: 200.0000 mg | ORAL_TABLET | Freq: Every morning | ORAL | 11 refills | Status: DC

## 2018-07-29 NOTE — Telephone Encounter (Signed)
Pt did get the Actavis generic brand again at the pharmacy for Xanax in May. I notice that the pharmacy often does not see the additional notes we put on the RX. She needs a RF of Xanax, but please call it in to make sure the instructions get relayed for the generic brand. Thanks. Also, patient needs a month supply of Pristique called in to the pharmacy there too. She is applying again for patient assist and still waiting on it. Doesn't want to run out. [facility] [address] [place] [phone]

## 2018-07-29 NOTE — Telephone Encounter (Signed)
Refills sent

## 2018-08-01 NOTE — Telephone Encounter (Signed)
[facility] use [facility]ACTAVIS[facility]  Left voicemail with pt to see what she would like to do? Switch pharmacies or ?

## 2018-08-05 ENCOUNTER — Other Ambulatory Visit: Payer: Self-pay

## 2018-08-05 MED ORDER — ALPRAZOLAM 0.5 MG PO TABS: 0.2500 mg | ORAL_TABLET | Freq: Three times a day (TID) | ORAL | 5 refills | Status: DC | PRN

## 2018-10-08 ENCOUNTER — Telehealth: Payer: Self-pay | Admitting: Psychiatry

## 2018-10-08 ENCOUNTER — Other Ambulatory Visit: Payer: Self-pay

## 2018-10-08 NOTE — Telephone Encounter (Signed)
Looks to be 11 refills sent in on 07/29/18.

## 2018-10-08 NOTE — Telephone Encounter (Signed)
Pt called to request refill on Pristiq @ [facility] [place]. Waited a little late. Trying to sign up with patient assistance.

## 2018-10-08 NOTE — Telephone Encounter (Signed)
Pharmacy confirmed she has 10 refills left. That is including the one they have ready for her today. Left pt. A detailed VM to let her know she could pick up her medicine and to call back with any questions.

## 2018-10-08 NOTE — Telephone Encounter (Signed)
Tried to reach pharmacy but was unable to speak to a tech, will try back to confirm refills on file.

## 2018-10-08 NOTE — Telephone Encounter (Signed)
Thanks, will contact pharmacy

## 2018-11-30 ENCOUNTER — Other Ambulatory Visit: Payer: Self-pay | Admitting: Psychiatry

## 2019-02-07 ENCOUNTER — Other Ambulatory Visit: Payer: Self-pay | Admitting: Psychiatry

## 2019-02-08 NOTE — Telephone Encounter (Signed)
Looks like she should have 1 more refill on file. Not sure if it's best I try to call it in with her brand issue ? I can call them on Monday to follow up. She comes yearly.

## 2019-03-05 ENCOUNTER — Ambulatory Visit: Payer: Self-pay | Admitting: Psychiatry

## 2019-04-04 ENCOUNTER — Other Ambulatory Visit: Payer: Self-pay | Admitting: Psychiatry

## 2019-04-06 ENCOUNTER — Other Ambulatory Visit: Payer: Self-pay

## 2019-04-06 ENCOUNTER — Telehealth: Payer: Self-pay | Admitting: Psychiatry

## 2019-04-06 MED ORDER — DESVENLAFAXINE SUCCINATE ER 100 MG PO TB24: 200.0000 mg | ORAL_TABLET | Freq: Every morning | ORAL | 0 refills | Status: DC

## 2019-04-06 NOTE — Telephone Encounter (Signed)
Pt requesting a refill on her Pristiq. She is requesting a 85mos supply to be filled at [facility] [address]. Appt scheduled for  3/16.

## 2019-04-06 NOTE — Telephone Encounter (Signed)
RX for Pristiq 100 mg 2/day #180 submitted

## 2019-04-21 ENCOUNTER — Ambulatory Visit (INDEPENDENT_AMBULATORY_CARE_PROVIDER_SITE_OTHER): Payer: Self-pay | Admitting: Psychiatry

## 2019-04-21 ENCOUNTER — Encounter: Payer: Self-pay | Admitting: Psychiatry

## 2019-04-21 DIAGNOSIS — F331 Major depressive disorder, recurrent, moderate: Secondary | ICD-10-CM

## 2019-04-21 DIAGNOSIS — F411 Generalized anxiety disorder: Secondary | ICD-10-CM

## 2019-04-21 MED ORDER — ALPRAZOLAM 0.5 MG PO TABS: 0.5000 mg | ORAL_TABLET | Freq: Four times a day (QID) | ORAL | 1 refills | Status: DC | PRN

## 2019-04-21 MED ORDER — ARIPIPRAZOLE 5 MG PO TABS: 5.0000 mg | ORAL_TABLET | Freq: Every day | ORAL | 1 refills | Status: DC

## 2019-04-21 NOTE — Progress Notes (Signed)
Tracey Lippitt [id number] [birth year] 59 y.o.  Virtual Visit via [facility] [place]  I connected with pt by WebEx and verified that I am speaking with the correct person using two identifiers.   I discussed the limitations, risks, security and privacy concerns of performing an evaluation and management service by [facility] [place] and the availability of in person appointments. I also discussed with the patient that there may be a patient responsible charge related to this service. The patient expressed understanding and agreed to proceed.  I discussed the assessment and treatment plan with the patient. The patient was provided an opportunity to ask questions and all were answered. The patient agreed with the plan and demonstrated an understanding of the instructions.   The patient was advised to call back or seek an in-person evaluation if the symptoms worsen or if the condition fails to improve as anticipated.  I provided 30 minutes of video time during this encounter. The call started at 1145 and ended at 12:15. The patient was located at home and the provider was located office.   Subjective:   Patient ID:  Tracey Fields is a 59 y.o. (DOB 02-02-1961) female.  Chief Complaint:  Chief Complaint  Patient presents with  . Follow-up    Chronic depression and anxiety  . Medication Problem    questions    HPI Tracey Fields presents for follow-up of depression and anxiety.  Last visit March 2020.  She was doing well on Pristiq 200 mg and we did not change medications.  When she has been forced to alternatives she has relapsed every time.  She was also noted needing alprazolam.  New generic is less effective Actavis/Teva.   Normally takes a half at night and another half once or twice in the day bc gets shaky with rapid pulse.   Other manufacturer PAR PHAMA works better.  Also cannot break the Actavis and needs to do so.   Needs Xanax TID and sometimes more.   3 cups coffee in the morning.  Worse with anxiety  about 3 pm.  No longer seems to help.  Doubled up on propranolol and some problems and it helps in the morning.  Dizziness started yesterday and lasted 2 days.  Didn't miss Pristiq.     Going to [place] to see D.    More depressed.  No energy.  No motivation.  For 4 mos.    Moved to [facility]  Moved to [employer].  Happy to be there.  Under less stress than she used to be.   Patient reports stable mood and denies depressed or irritable moods.  Patient denies any recent difficulty with anxiety.  Patient denies difficulty with sleep initiation or maintenance. Denies appetite disturbance.  Patient reports that energy and motivation have been good.  Patient denies any difficulty with concentration.  Patient denies any suicidal ideation.  Use Xanax and the round ones are less effective than what she had before.   Past Psychiatric Medication Trials: Pristiq 200, Xanax 0.5 mg 3 times daily as needed, propranolol 10 mg, temazepam amnesia, trazodone no response, mirtazapine, clonazepam, buspirone, citalopram 40 no response, Wellbutrin, fluoxetine, lithium, venlafaxine, Ambien  Review of Systems:  Review of Systems  Respiratory: Positive for cough.   Neurological: Negative for tremors and weakness.    Medications: I have reviewed the patient's current medications.   Current Outpatient Medications  Medication Sig Dispense Refill  . ALPRAZolam (XANAX) 0.5 MG tablet Take 1 tablet (0.5 mg total) by mouth 4 (four) times daily  as needed for anxiety. 120 tablet 1  . aspirin EC 81 MG tablet Take 81 mg by mouth at bedtime.     . Aspirin-Salicylamide-Caffeine (BC HEADACHE POWDER PO) Take 1 each by mouth daily as needed (pain.). Reported on 04/21/2015    . cetirizine (ZYRTEC) 10 MG tablet Take 5 mg by mouth at bedtime.     Marland Kitchen desvenlafaxine (PRISTIQ) 100 MG 24 hr tablet Take 2 tablets (200 mg total) by mouth every morning. 180 tablet 0  . ibuprofen (ADVIL,MOTRIN) 200 MG tablet Take 400 mg by mouth every 6 (six) hours as  needed for headache or moderate pain. Reported on 04/21/2015    . lipase/protease/amylase (CREON) 36000 UNITS CPEP capsule TAKE ONE CAPSULE BY MOUTH 3 TIMES A DAY WITH MEALS 100 capsule 1  . omeprazole (PRILOSEC) 40 MG capsule     . propranolol (INDERAL) 10 MG tablet TAKE 1 TABLET BY MOUTH THREE TIMES A DAY**INSURANCE ONLY ALLOWS 30 DAYS** 90 tablet 5  . triamcinolone cream (KENALOG) 0.1 %     . ARIPiprazole (ABILIFY) 5 MG tablet Take 1 tablet (5 mg total) by mouth daily. 30 tablet 1   No current facility-administered medications for this visit.    Medication Side Effects: None  Allergies:  Allergies  Allergen Reactions  . Anesthetics, Amide Other (See Comments)    System shut down when 60 years old having jaw work done  . Succinylcholine Chloride Other (See Comments)    Major organs stopped working    Past Medical History:  Diagnosis Date  . Abdominal pain, left lower quadrant 02/24/2010   pain resolved.  Marland Kitchen ANXIETY 09/21/2006  . Arthritis    thumbs   . BACK PAIN 01/12/2009  . Cancer ([place])    hx of skin cancers on face   . Chronic pancreatitis ([place]) 07/29/2012   Treatment Creon orally  . COPD (chronic obstructive pulmonary disease) ([place])   . COUGH, CHRONIC 09/21/2006   remains occ. with productive white mucus 07-19-14  . DEPRESSION 12/29/2007  . DIVERTICULOSIS, COLON 12/29/2007  . DYSPNEA 06/02/2008  . Family history of anesthesia complication    ? malignant hyerthermia- grandfather   . FATIGUE 07/14/2008  . HEMORRHOIDS, HX OF 09/21/2006   07-19-14 no problems now.  Marland Kitchen HOARSENESS, CHRONIC 08/13/2008   remains "raspy deep voice"  . HYPERTENSION 12/29/2007   denies" takes Propranolol for tremors and heart rhythm  . Impaired glucose tolerance 10/19/2010  . Irritable bowel syndrome 09/21/2006  . Left leg numbness    resolved-not a problem 07-19-14.  . Malignant hyperthermia   . Mitral valve prolapse   . Pain    more abdominal "midline to RLQ" on 07-19-14  . PREMATURE VENTRICULAR  CONTRACTIONS 09/21/2006  . 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 SORE THROAT 07/14/2008   07-19-14 no problem today.  . Wheezing 05/18/2009   remains as of 07-19-14    Family History  Problem Relation Age of Onset  . Lung cancer Father   . Prostate cancer Father   . Breast cancer Father   . Hypertension Mother   . COPD Mother   . Diabetes Maternal Grandfather   . Colon polyps Other     Social History   Socioeconomic History  . Marital status: Divorced    Spouse name: Not on file  . Number of children: 2  . Years of education: Not on file  . Highest education level: Not on file  Occupational History  . Occupation: independent Marine scientist: [employer]  Tobacco Use  . Smoking status: Current Every Day Smoker    Packs/day: 1.50    Years: 35.00    Pack years: 52.50    Types: Cigarettes  . Smokeless tobacco: Never Used  . Tobacco comment: 1 ppd, patient has been counseled to quit smoking 08/13/08  Substance and Sexual Activity  . Alcohol use: Yes    Alcohol/week: 0.0 standard drinks    Comment: social-beer 1 nightly  . Drug use: No  . Sexual activity: Yes  Other Topics Concern  . Not on file  Social History Narrative  . Not on file   Social Determinants of Health   Financial Resource Strain:   . Difficulty of Paying Living Expenses:   Food Insecurity:   . Worried About Charity fundraiser in the Last Year:   . Arboriculturist in the Last Year:   Transportation Needs:   . Film/video editor (Medical):   Marland Kitchen Lack of Transportation (Non-Medical):   Physical Activity:   . Days of Exercise per Week:   . Minutes of Exercise per Session:   Stress:   . Feeling of Stress :   Social Connections:   . Frequency of Communication with Friends and Family:   . Frequency of Social Gatherings with Friends and Family:   . Attends Religious Services:   . Active Member of Clubs or Organizations:   . Attends Archivist Meetings:   Marland Kitchen Marital  Status:   Intimate Partner Violence:   . Fear of Current or Ex-Partner:   . Emotionally Abused:   Marland Kitchen Physically Abused:   . Sexually Abused:     Past Medical History, Surgical history, Social history, and Family history were reviewed and updated as appropriate.   Please see review of systems for further details on the patient's review from today.   Objective:   Physical Exam:  LMP 06/18/2014   Physical Exam Neurological:     Mental Status: She is alert and oriented to person, place, and time.     Cranial Nerves: No dysarthria.  Psychiatric:        Attention and Perception: Attention normal.        Mood and Affect: Mood is anxious and depressed.        Speech: Speech normal.        Behavior: Behavior is cooperative.        Thought Content: Thought content normal. Thought content is not paranoid or delusional. Thought content does not include homicidal or suicidal ideation. Thought content does not include homicidal or suicidal plan.        Cognition and Memory: Cognition and memory normal.        Judgment: Judgment normal.     Lab Review:     Component Value Date/Time   NA 142 10/21/2014 1016   K 4.5 10/21/2014 1016   CL 104 10/21/2014 1016   CO2 29 10/21/2014 1016   GLUCOSE 116 (H) 10/21/2014 1016   BUN 11 10/21/2014 1016   CREATININE 0.83 10/21/2014 1016   CALCIUM 9.8 10/21/2014 1016   PROT 6.6 10/21/2014 1016   ALBUMIN 4.2 10/21/2014 1016   AST 19 10/21/2014 1016   ALT 18 10/21/2014 1016   ALKPHOS 84 10/21/2014 1016   BILITOT 0.4 10/21/2014 1016   GFRNONAA >60 07/19/2014 1504   GFRAA >60 07/19/2014 1504       Component Value Date/Time   WBC 6.0 10/21/2014 1016   RBC 4.41 10/21/2014 1016   HGB  13.6 10/21/2014 1016   HCT 39.3 10/21/2014 1016   PLT 261 10/21/2014 1016   MCV 89.1 10/21/2014 1016   MCH 30.8 10/21/2014 1016   MCHC 34.6 10/21/2014 1016   RDW 14.7 10/21/2014 1016   LYMPHSABS 2.2 10/21/2014 1016   MONOABS 0.5 10/21/2014 1016   EOSABS 0.1  10/21/2014 1016   BASOSABS 0.0 10/21/2014 1016    No results found for: POCLITH, LITHIUM   No results found for: PHENYTOIN, PHENOBARB, VALPROATE, CBMZ   .res Assessment: Plan:    Major depressive disorder, recurrent episode, moderate (HCC) - Plan: ARIPiprazole (ABILIFY) 5 MG tablet  Generalized anxiety disorder - Plan: ALPRAZolam (XANAX) 0.5 MG tablet   Sheanna has had relapses in the past when she tried to stop antidepressants.  .  Discussed SSRI withdrawal because she has had that in the past when she tried to go off of Effexor.  She will contact us for specific instructions before trying to do so this time. Her symptoms of depression and anxiety have worsened since her last appointment and for several months.  She was encouraged not to let this go on for months if she does not respond.  We discussed a variety of options especially given that she is failed multiple different antidepressants.  Reduce caffeine. Disc HA.   Abilify 5 mg daily.  Discussed potential metabolic side effects associated with atypical antipsychotics, as well as potential risk for movement side effects. Advised pt to contact office if movement side effects occur.   Increase propranolol 20 mg AM and 2 PM  She needs the Xanax PAN as needed for mild panic episodes.  She finds the Activa's generic to be less effective than PAR PHARMA generic alprazolam.  I will order her to receive any generic except Actavis.  Patient has a high deductible policy but it is covering her medication  Follow-up 6 weeks  Lynder Parents MD, DFAPA  Please see After Visit Summary for patient specific instructions.  No future appointments.  No orders of the defined types were placed in this encounter.     -------------------------------

## 2019-05-13 ENCOUNTER — Other Ambulatory Visit: Payer: Self-pay | Admitting: Psychiatry

## 2019-05-13 DIAGNOSIS — F331 Major depressive disorder, recurrent, moderate: Secondary | ICD-10-CM

## 2019-05-13 NOTE — Telephone Encounter (Signed)
90 day okay? She started in March due for follow up 6 weeks

## 2019-05-22 ENCOUNTER — Telehealth: Payer: Self-pay | Admitting: Psychiatry

## 2019-05-22 NOTE — Telephone Encounter (Signed)
Left message to call back  

## 2019-05-22 NOTE — Telephone Encounter (Signed)
Patient called and said that she is not sleeping at all and wants to know if that is a side effect of the abilify. She has taken [place] before and that has helped. Please give her a call at [phone]

## 2019-05-22 NOTE — Telephone Encounter (Signed)
Yes it can be a side effect initially.  It rarely persists after the first few weeks.  It looks like she has been taking Abilify for about 4 weeks.  It should resolve in another couple of weeks.  Please verify that the Abilify has in fact worked and helped her depression.  If so go ahead and send in prescription or call in prescription for zolpidem 5 mg tablets 1 nightly as needed #30 no refills. Let us know if the Ambien does not solve the problem or if the insomnia persists after a couple of weeks.  We may have to adjust on the dosage of the Ambien to get rid of the insomnia.

## 2019-06-11 ENCOUNTER — Other Ambulatory Visit: Payer: Self-pay | Admitting: Psychiatry

## 2019-06-11 DIAGNOSIS — F331 Major depressive disorder, recurrent, moderate: Secondary | ICD-10-CM

## 2019-06-18 ENCOUNTER — Telehealth: Payer: Self-pay | Admitting: Psychiatry

## 2019-06-18 NOTE — Telephone Encounter (Signed)
Pt left message that she went and picked up her rx for Abilify and somehow threw it in the trash and it was picked up before she realized it. Pt wants to know if she can get another rx called in.

## 2019-06-18 NOTE — Telephone Encounter (Signed)
Ok to refill. Use goodrx at [place] or [facility] is usually cheapest with goodrx

## 2019-06-19 ENCOUNTER — Other Ambulatory Visit: Payer: Self-pay

## 2019-06-19 DIAGNOSIS — F331 Major depressive disorder, recurrent, moderate: Secondary | ICD-10-CM

## 2019-06-19 MED ORDER — ARIPIPRAZOLE 5 MG PO TABS: 5.0000 mg | ORAL_TABLET | Freq: Every day | ORAL | 0 refills | Status: DC

## 2019-06-19 NOTE — Telephone Encounter (Signed)
Spoke with patient and recommend she use [facility] or [facility], I pulled up [facility] for her and went over prices. I located a Product/process development scientist close to her at [place]. She's aware it was submitted and she requests a 30 day supply Abilify 5 mg 1 daily

## 2019-06-23 ENCOUNTER — Encounter: Payer: Self-pay | Admitting: Psychiatry

## 2019-06-23 ENCOUNTER — Telehealth (INDEPENDENT_AMBULATORY_CARE_PROVIDER_SITE_OTHER): Payer: BLUE CROSS/BLUE SHIELD | Admitting: Psychiatry

## 2019-06-23 DIAGNOSIS — F5105 Insomnia due to other mental disorder: Secondary | ICD-10-CM

## 2019-06-23 DIAGNOSIS — F411 Generalized anxiety disorder: Secondary | ICD-10-CM

## 2019-06-23 DIAGNOSIS — F331 Major depressive disorder, recurrent, moderate: Secondary | ICD-10-CM | POA: Diagnosis not present

## 2019-06-23 MED ORDER — ZOLPIDEM TARTRATE 5 MG PO TABS: 5.0000 mg | ORAL_TABLET | Freq: Every evening | ORAL | 0 refills | Status: DC | PRN

## 2019-06-23 MED ORDER — PROPRANOLOL HCL 20 MG PO TABS: 20.0000 mg | ORAL_TABLET | Freq: Three times a day (TID) | ORAL | 1 refills | Status: DC

## 2019-06-23 NOTE — Progress Notes (Signed)
Skylan Sampat [id number] 09/01/1960 59 y.o.  Virtual Visit via [facility] [place]  I connected with pt by WebEx and verified that I am speaking with the correct person using two identifiers.   I discussed the limitations, risks, security and privacy concerns of performing an evaluation and management service by [facility] [place] and the availability of in person appointments. I also discussed with the patient that there may be a patient responsible charge related to this service. The patient expressed understanding and agreed to proceed.  I discussed the assessment and treatment plan with the patient. The patient was provided an opportunity to ask questions and all were answered. The patient agreed with the plan and demonstrated an understanding of the instructions.   The patient was advised to call back or seek an in-person evaluation if the symptoms worsen or if the condition fails to improve as anticipated.  I provided 30 minutes of video time during this encounter. The call started at 330 and ended at 400. The patient was located at home and the provider was located office.   Subjective:   Patient ID:  Tracey Fields is a 59 y.o. (DOB [birth year]) female.  Chief Complaint:  Chief Complaint  Patient presents with  . Follow-up  . Depression  . Anxiety  . Sleeping Problem    HPI Tracey Fields presents for follow-up of depression and anxiety.  Last visit March 2020.  She was doing well on Pristiq 200 mg and we did not change medications.  When she has been forced to alternatives she has relapsed every time.  She was also noted needing alprazolam.  New generic is less effective Actavis/Teva.   Normally takes a half at night and another half once or twice in the day bc gets shaky with rapid pulse.   Other manufacturer PAR PHAMA works better.  Also cannot break the Actavis and needs to do so.   Last seen April 21, 2019.  The following was noted: Needs Xanax TID and sometimes more.   3 cups coffee in the  morning. Worse with anxiety about 3 pm.  No longer seems to help.  Doubled up on propranolol and some problems and it helps in the morning. Dizziness started yesterday and lasted 2 days.  Didn't miss Pristiq.     More depressed.  No energy.  No motivation.  For 4 mos.    Moved to [facility].  Moved to [employer].  Happy to be there.  Under less stress than she used to be.   Patient reports stable mood and denies depressed or irritable moods.  Patient denies any recent difficulty with anxiety.  Patient denies difficulty with sleep initiation or maintenance. Denies appetite disturbance.  Patient reports that energy and motivation have been good.  Patient denies any difficulty with concentration.  Patient denies any suicidal ideation. Plan: Her symptoms of depression and anxiety have worsened since her last appointment and for several months.  She was encouraged not to let this go on for months if she does not respond.   Reduce caffeine. Disc HA.  Abilify 5 mg daily.  Also increase propranolol 20 mg every morning and every 2 p.m. for anxiety. Use Xanax and the round ones are less effective than what she had before.   05/22/2019, patient called reporting insomnia and asking for Ambien.  06/23/2019 appointment, the following is noted: It did help significantly. Always some depression and death thoughts.  Tired all the time.  Low energy chronically without change on Abilify.  Only thing that  helps energy is a beer.   No SE except insomnia.  Cannot fall asleep.  Avg 5 hours of sleep and needs 9 hours.   Mother took propranolol 20 mg BID helps anxiety and able to take less Xanax. 3 cups decaf AM.     Past Psychiatric Medication Trials: Pristiq 200, Xanax 0.5 mg 3 times daily as needed, propranolol 10 mg, temazepam amnesia, trazodone no response, mirtazapine, clonazepam, buspirone, citalopram 40 no response, Wellbutrin, fluoxetine, lithium, venlafaxine, Ambien  Review of Systems:  Review of Systems   Respiratory: Positive for cough.   Neurological: Negative for tremors and weakness.    Medications: I have reviewed the patient's current medications.   Current Outpatient Medications  Medication Sig Dispense Refill  . ALPRAZolam (XANAX) 0.5 MG tablet Take 1 tablet (0.5 mg total) by mouth 4 (four) times daily as needed for anxiety. 120 tablet 1  . ARIPiprazole (ABILIFY) 5 MG tablet Take 1 tablet (5 mg total) by mouth daily. 30 tablet 0  . aspirin EC 81 MG tablet Take 81 mg by mouth at bedtime.     . Aspirin-Salicylamide-Caffeine (BC HEADACHE POWDER PO) Take 1 each by mouth daily as needed (pain.). Reported on 04/21/2015    . cetirizine (ZYRTEC) 10 MG tablet Take 5 mg by mouth at bedtime.     Marland Kitchen desvenlafaxine (PRISTIQ) 100 MG 24 hr tablet Take 2 tablets (200 mg total) by mouth every morning. 180 tablet 0  . ibuprofen (ADVIL,MOTRIN) 200 MG tablet Take 400 mg by mouth every 6 (six) hours as needed for headache or moderate pain. Reported on 04/21/2015    . lipase/protease/amylase (CREON) 36000 UNITS CPEP capsule TAKE ONE CAPSULE BY MOUTH 3 TIMES A DAY WITH MEALS 100 capsule 1  . omeprazole (PRILOSEC) 40 MG capsule     . propranolol (INDERAL) 20 MG tablet Take 1 tablet (20 mg total) by mouth 3 (three) times daily. 90 tablet 1  . triamcinolone cream (KENALOG) 0.1 %     . zolpidem (AMBIEN) 5 MG tablet Take 1 tablet (5 mg total) by mouth at bedtime as needed for sleep. 30 tablet 0   No current facility-administered medications for this visit.    Medication Side Effects: None  Allergies:  Allergies  Allergen Reactions  . Anesthetics, Amide Other (See Comments)    System shut down when 60 years old having jaw work done  . Succinylcholine Chloride Other (See Comments)    Major organs stopped working    Past Medical History:  Diagnosis Date  . Abdominal pain, left lower quadrant 02/24/2010   pain resolved.  Marland Kitchen ANXIETY 09/21/2006  . Arthritis    thumbs   . BACK PAIN 01/12/2009  . Cancer  ([place])    hx of skin cancers on face   . Chronic pancreatitis ([place]) 07/29/2012   Treatment Creon orally  . COPD (chronic obstructive pulmonary disease) ([place])   . COUGH, CHRONIC 09/21/2006   remains occ. with productive white mucus 07-19-14  . DEPRESSION 12/29/2007  . DIVERTICULOSIS, COLON 12/29/2007  . DYSPNEA 06/02/2008  . Family history of anesthesia complication    ? malignant hyerthermia- grandfather   . FATIGUE 07/14/2008  . HEMORRHOIDS, HX OF 09/21/2006   07-19-14 no problems now.  Marland Kitchen HOARSENESS, CHRONIC 08/13/2008   remains "raspy deep voice"  . HYPERTENSION 12/29/2007   denies" takes Propranolol for tremors and heart rhythm  . Impaired glucose tolerance 10/19/2010  . Irritable bowel syndrome 09/21/2006  . Left leg numbness    resolved-not a  problem 07-19-14.  . Malignant hyperthermia   . Mitral valve prolapse   . Pain    more abdominal "midline to RLQ" on 07-19-14  . 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 SORE THROAT 07/14/2008   07-19-14 no problem today.  . Wheezing 05/18/2009   remains as of 07-19-14    Family History  Problem Relation Age of Onset  . Lung cancer Father   . Prostate cancer Father   . Breast cancer Father   . Hypertension Mother   . COPD Mother   . Diabetes Maternal Grandfather   . Colon polyps Other     Social History   Socioeconomic History  . Marital status: Divorced    Spouse name: Not on file  . Number of children: 2  . Years of education: Not on file  . Highest education level: Not on file  Occupational History  . Occupation: independent Marine scientist: [employer]  Tobacco Use  . Smoking status: Current Every Day Smoker    Packs/day: 1.50    Years: 35.00    Pack years: 52.50    Types: Cigarettes  . Smokeless tobacco: Never Used  . Tobacco comment: 1 ppd, patient has been counseled to quit smoking 08/13/08  Substance and Sexual Activity  . Alcohol use: Yes     Alcohol/week: 0.0 standard drinks    Comment: social-beer 1 nightly  . Drug use: No  . Sexual activity: Yes  Other Topics Concern  . Not on file  Social History Narrative  . Not on file   Social Determinants of Health   Financial Resource Strain:   . Difficulty of Paying Living Expenses:   Food Insecurity:   . Worried About Charity fundraiser in the Last Year:   . Arboriculturist in the Last Year:   Transportation Needs:   . Film/video editor (Medical):   Marland Kitchen Lack of Transportation (Non-Medical):   Physical Activity:   . Days of Exercise per Week:   . Minutes of Exercise per Session:   Stress:   . Feeling of Stress :   Social Connections:   . Frequency of Communication with Friends and Family:   . Frequency of Social Gatherings with Friends and Family:   . Attends Religious Services:   . Active Member of Clubs or Organizations:   . Attends Archivist Meetings:   Marland Kitchen Marital Status:   Intimate Partner Violence:   . Fear of Current or Ex-Partner:   . Emotionally Abused:   Marland Kitchen Physically Abused:   . Sexually Abused:     Past Medical History, Surgical history, Social history, and Family history were reviewed and updated as appropriate.   Please see review of systems for further details on the patient's review from today.   Objective:   Physical Exam:  LMP 06/18/2014   Physical Exam Neurological:     Mental Status: She is alert and oriented to person, place, and time.     Cranial Nerves: No dysarthria.  Psychiatric:        Attention and Perception: Attention normal.        Mood and Affect: Mood is anxious and depressed.        Speech: Speech normal.        Behavior: Behavior is cooperative.        Thought Content: Thought content normal. Thought content is not paranoid or delusional. Thought content does not include homicidal or suicidal  ideation. Thought content does not include homicidal or suicidal plan.        Cognition and Memory: Cognition and  memory normal.        Judgment: Judgment normal.     Lab Review:     Component Value Date/Time   NA 142 10/21/2014 1016   K 4.5 10/21/2014 1016   CL 104 10/21/2014 1016   CO2 29 10/21/2014 1016   GLUCOSE 116 (H) 10/21/2014 1016   BUN 11 10/21/2014 1016   CREATININE 0.83 10/21/2014 1016   CALCIUM 9.8 10/21/2014 1016   PROT 6.6 10/21/2014 1016   ALBUMIN 4.2 10/21/2014 1016   AST 19 10/21/2014 1016   ALT 18 10/21/2014 1016   ALKPHOS 84 10/21/2014 1016   BILITOT 0.4 10/21/2014 1016   GFRNONAA >60 07/19/2014 1504   GFRAA >60 07/19/2014 1504       Component Value Date/Time   WBC 6.0 10/21/2014 1016   RBC 4.41 10/21/2014 1016   HGB 13.6 10/21/2014 1016   HCT 39.3 10/21/2014 1016   PLT 261 10/21/2014 1016   MCV 89.1 10/21/2014 1016   MCH 30.8 10/21/2014 1016   MCHC 34.6 10/21/2014 1016   RDW 14.7 10/21/2014 1016   LYMPHSABS 2.2 10/21/2014 1016   MONOABS 0.5 10/21/2014 1016   EOSABS 0.1 10/21/2014 1016   BASOSABS 0.0 10/21/2014 1016    No results found for: POCLITH, LITHIUM   No results found for: PHENYTOIN, PHENOBARB, VALPROATE, CBMZ   .res Assessment: Plan:    Major depressive disorder, recurrent episode, moderate (HCC)  Generalized anxiety disorder - Plan: propranolol (INDERAL) 20 MG tablet  Insomnia due to mental condition - Plan: zolpidem (AMBIEN) 5 MG tablet   Tracey Fields has had relapses in the past when she tried to stop antidepressants.  .  Discussed SSRI withdrawal because she has had that in the past when she tried to go off of Effexor.  She will contact us for specific instructions before trying to do so this time. Her symptoms of depression and anxiety have worsened since her last appointment and for several months.  She was encouraged not to let this go on for months if she does not respond.  We discussed a variety of options especially given that she is failed multiple different antidepressants.  She has successfully decaffeinated.  Abilify has been  helpful for her depression significantly and resolved death thoughts.  She still has residual depression.  The Abilify has caused some insomnia.  Prescribed Ambien 5 mg nightly as needed insomnia. Also trial of increasing Abilify to 10 mg daily to see if it will resolve the insomnia and potentially help her depression further.  As noted she is failed multiple other antidepressants.  Discussed potential metabolic side effects associated with atypical antipsychotics, as well as potential risk for movement side effects. Advised pt to contact office if movement side effects occur.   Continue propranolol 20 mg AM and 2 PM.  This is helped her anxiety.  She has been able to reduce Xanax by taking the higher dose of propranolol.  She complains of chronic fatigue that predates the Abilify and propranolol.  She needs the Xanax PAN as needed for mild panic episodes.  She finds the Activa's generic to be less effective than PAR PHARMA generic alprazolam.  I will order her to receive any generic except Actavis.  Patient has a high deductible policy but it is covering her medication  Follow-up 6 weeks  Lynder Parents MD, DFAPA  Please see After  Visit Summary for patient specific instructions.  No future appointments.  No orders of the defined types were placed in this encounter.     -------------------------------

## 2019-07-16 ENCOUNTER — Other Ambulatory Visit: Payer: Self-pay | Admitting: Psychiatry

## 2019-07-16 DIAGNOSIS — F411 Generalized anxiety disorder: Secondary | ICD-10-CM

## 2019-07-16 NOTE — Telephone Encounter (Signed)
Last apt 05/18. Follow up 6 weeks

## 2019-07-21 ENCOUNTER — Telehealth: Payer: Self-pay | Admitting: Psychiatry

## 2019-07-21 NOTE — Telephone Encounter (Signed)
LM to call back with recommendation

## 2019-07-21 NOTE — Telephone Encounter (Signed)
Patient called and said that she has been on new medicaiton for 2 months now and she says that her insides feel like they are going to pop outand on the outside she feels lazy no energy. Please give her a call at [phone]

## 2019-07-21 NOTE — Telephone Encounter (Signed)
Patient had an appointment in May.  Abilify 5 mg was helping with her depression but she was having insomnia and so the Abilify dose was increased to 10 mg to see if it would resolve the insomnia.  It sounds like she is now having restlessness and fatigue from the Abilify dosage increase.  Ask her if she is sleeping fairly normally now.  Have her stop Abilify for 3 days to resolve the side effect and then restart back at the 5 mg dosage.  The longer she takes Abilify the less likely it is to adversely affect her sleep and if she is sleeping reasonably well now it is not likely that she will develop insomnia when we reduce the dosage back to 5 mg.

## 2019-07-22 NOTE — Telephone Encounter (Signed)
LM to call back tomorrow with an update if symptoms improving any.

## 2019-07-24 NOTE — Telephone Encounter (Signed)
LM to call back with an update on how she's feeling

## 2019-07-31 ENCOUNTER — Other Ambulatory Visit: Payer: Self-pay | Admitting: Psychiatry

## 2019-11-23 ENCOUNTER — Other Ambulatory Visit: Payer: Self-pay | Admitting: Psychiatry

## 2019-11-23 DIAGNOSIS — F411 Generalized anxiety disorder: Secondary | ICD-10-CM

## 2019-11-26 NOTE — Telephone Encounter (Signed)
Last apt 04/21/19 due at 6 weeks

## 2019-11-26 NOTE — Telephone Encounter (Signed)
Made appointment for patient on 01/26/20 at 3 pm

## 2019-11-26 NOTE — Telephone Encounter (Signed)
Call to RS

## 2020-01-26 ENCOUNTER — Encounter: Payer: Self-pay | Admitting: Psychiatry

## 2020-01-26 ENCOUNTER — Telehealth (INDEPENDENT_AMBULATORY_CARE_PROVIDER_SITE_OTHER): Payer: BLUE CROSS/BLUE SHIELD | Admitting: Psychiatry

## 2020-01-26 DIAGNOSIS — F411 Generalized anxiety disorder: Secondary | ICD-10-CM

## 2020-01-26 DIAGNOSIS — F331 Major depressive disorder, recurrent, moderate: Secondary | ICD-10-CM | POA: Diagnosis not present

## 2020-01-26 DIAGNOSIS — F5105 Insomnia due to other mental disorder: Secondary | ICD-10-CM | POA: Diagnosis not present

## 2020-01-26 MED ORDER — PROPRANOLOL HCL 20 MG PO TABS: 20.0000 mg | ORAL_TABLET | Freq: Three times a day (TID) | ORAL | 5 refills | Status: DC

## 2020-01-26 MED ORDER — DESVENLAFAXINE SUCCINATE ER 100 MG PO TB24: 200.0000 mg | ORAL_TABLET | Freq: Every morning | ORAL | 12 refills | Status: DC

## 2020-01-26 MED ORDER — ALPRAZOLAM 0.5 MG PO TABS: ORAL_TABLET | ORAL | 5 refills | Status: DC

## 2020-01-26 NOTE — Progress Notes (Signed)
Tracey Fields [id number] [birth year] 59 y.o.  Virtual Visit via Telephone Note  I connected with pt by telephone and verified that I am speaking with the correct person using two identifiers.   I discussed the limitations, risks, security and privacy concerns of performing an evaluation and management service by telephone and the availability of in person appointments. I also discussed with the patient that there may be a patient responsible charge related to this service. The patient expressed understanding and agreed to proceed.  I discussed the assessment and treatment plan with the patient. The patient was provided an opportunity to ask questions and all were answered. The patient agreed with the plan and demonstrated an understanding of the instructions.   The patient was advised to call back or seek an in-person evaluation if the symptoms worsen or if the condition fails to improve as anticipated.  I provided 15 minutes of non-face-to-face time during this encounter. The call started at 315 and ended at 330. The patient was located at home and the provider was located office.   Subjective:   Patient ID:  Tracey Fields is a 59 y.o. (DOB 1960-12-05) female.  Chief Complaint:  Chief Complaint  Patient presents with  . Follow-up  . Depression  . Anxiety    HPI Tracey Fields presents for follow-up of depression and anxiety.  visit March 2020.  She was doing well on Pristiq 200 mg and we did not change medications.  When she has been forced to alternatives she has relapsed every time.  She was also noted needing alprazolam.  New generic is less effective Actavis/Teva.   Normally takes a half at night and another half once or twice in the day bc gets shaky with rapid pulse.   Other manufacturer PAR PHAMA works better.  Also cannot break the Actavis and needs to do so.   seen April 21, 2019.  The following was noted: Needs Xanax TID and sometimes more.   3 cups coffee in the  morning. Worse with anxiety about 3 pm.  No longer seems to help.  Doubled up on propranolol and some problems and it helps in the morning. Dizziness started yesterday and lasted 2 days.  Didn't miss Pristiq.     More depressed.  No energy.  No motivation.  For 4 mos.    Moved to [facility].  Moved to [place] [place].  Happy to be there.  Under less stress than she used to be.   Patient reports stable mood and denies depressed or irritable moods.  Patient denies any recent difficulty with anxiety.  Patient denies difficulty with sleep initiation or maintenance. Denies appetite disturbance.  Patient reports that energy and motivation have been good.  Patient denies any difficulty with concentration.  Patient denies any suicidal ideation. Plan: Her symptoms of depression and anxiety have worsened since her last appointment and for several months.  She was encouraged not to let this go on for months if she does not respond.   Reduce caffeine. Disc HA.  Abilify 5 mg daily.  Also increase propranolol 20 mg every morning and every 2 p.m. for anxiety. Use Xanax and the round ones are less effective than what she had before.   05/22/2019, patient called reporting insomnia and asking for Ambien.  06/23/2019 appointment, the following is noted: It did help significantly. Always some depression and death thoughts.  Tired all the time.  Low energy chronically without change on Abilify.  Only thing that helps energy is a beer.  No SE except insomnia.  Cannot fall asleep.  Avg 5 hours of sleep and needs 9 hours.   Mother took propranolol 20 mg BID helps anxiety and able to take less Xanax. 3 cups decaf AM.   Plan: Prescribed Ambien 5 mg nightly as needed insomnia. Also trial of increasing Abilify to 10 mg daily to see if it will resolve the insomnia and potentially help her depression further.  As noted she is failed multiple other antidepressants.  07/21/2019 phone call patient complaining of both fatigue and restlessness  on Abilify 10 mg and was told to hold it for 3 days and then restarted 5 mg.  01/26/2020 appointment with the following noted: Quit Abilify awhile back.  She thinks bc SE. Always going to be depressed now that mom is gone.  I think it's normal.  Working on enjoyment and making herself go out.  Has friends pushing her to get out and whe works and that helpes No Ambien.  Sleep is fine now.  Don't need Ambien now that off Xanax. Takes Xanax prn anxiety and regularly at night.  Average 1-2 daily depending on the day. No problems with the meds.  Doesn't want med changes or more meds.  . Getting Pristiq throught [place]. Asked about taking propranolol to shaking.  Past Psychiatric Medication Trials: Pristiq 200, Xanax 0.5 mg 3 times daily as needed, propranolol 10 mg, temazepam amnesia, trazodone no response, mirtazapine, clonazepam, buspirone,  citalopram 40 no response, Wellbutrin, fluoxetine, lithium, venlafaxine, Pristiq Ambien  Review of Systems:  Review of Systems  Respiratory: Positive for cough.   Neurological: Negative for tremors and weakness.    Medications: I have reviewed the patient's current medications.   Current Outpatient Medications  Medication Sig Dispense Refill  . aspirin EC 81 MG tablet Take 81 mg by mouth at bedtime.    . Aspirin-Salicylamide-Caffeine (BC HEADACHE POWDER PO) Take 1 each by mouth daily as needed (pain.). Reported on 04/21/2015    . cetirizine (ZYRTEC) 10 MG tablet Take 5 mg by mouth at bedtime.     Marland Kitchen ibuprofen (ADVIL,MOTRIN) 200 MG tablet Take 400 mg by mouth every 6 (six) hours as needed for headache or moderate pain. Reported on 04/21/2015    . lipase/protease/amylase (CREON) 36000 UNITS CPEP capsule TAKE ONE CAPSULE BY MOUTH 3 TIMES A DAY WITH MEALS 100 capsule 1  . omeprazole (PRILOSEC) 40 MG capsule     . triamcinolone cream (KENALOG) 0.1 %     . zolpidem (AMBIEN) 5 MG tablet Take 1 tablet (5 mg total) by mouth at bedtime as needed for sleep. 30  tablet 0  . ALPRAZolam (XANAX) 0.5 MG tablet TAKE 1 TABLET BY MOUTH FOUR TIMES DAILY AS NEEDED FOR ANXIETY 120 tablet 5  . desvenlafaxine (PRISTIQ) 100 MG 24 hr tablet Take 2 tablets (200 mg total) by mouth every morning. 60 tablet 12  . propranolol (INDERAL) 20 MG tablet Take 1 tablet (20 mg total) by mouth 3 (three) times daily. 90 tablet 5   No current facility-administered medications for this visit.    Medication Side Effects: None  Allergies:  Allergies  Allergen Reactions  . Anesthetics, Amide Other (See Comments)    System shut down when 59 years old having jaw work done  . Succinylcholine Chloride Other (See Comments)    Major organs stopped working    Past Medical History:  Diagnosis Date  . Abdominal pain, left lower quadrant 02/24/2010   pain resolved.  Marland Kitchen ANXIETY 09/21/2006  .  Arthritis    thumbs   . BACK PAIN 01/12/2009  . Cancer ([place])    hx of skin cancers on face   . Chronic pancreatitis ([place]) 07/29/2012   Treatment Creon orally  . COPD (chronic obstructive pulmonary disease) ([place])   . COUGH, CHRONIC 09/21/2006   remains occ. with productive white mucus 07-19-14  . DEPRESSION 12/29/2007  . DIVERTICULOSIS, COLON 12/29/2007  . DYSPNEA 06/02/2008  . Family history of anesthesia complication    ? malignant hyerthermia- grandfather   . FATIGUE 07/14/2008  . HEMORRHOIDS, HX OF 09/21/2006   07-19-14 no problems now.  Marland Kitchen HOARSENESS, CHRONIC 08/13/2008   remains "raspy deep voice"  . HYPERTENSION 12/29/2007   denies" takes Propranolol for tremors and heart rhythm  . Impaired glucose tolerance 10/19/2010  . Irritable bowel syndrome 09/21/2006  . Left leg numbness    resolved-not a problem 07-19-14.  . Malignant hyperthermia   . Mitral valve prolapse   . Pain    more abdominal "midline to RLQ" on 07-19-14  . 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 SORE THROAT 07/14/2008   07-19-14 no problem today.  . Wheezing 05/18/2009    remains as of 07-19-14    Family History  Problem Relation Age of Onset  . Lung cancer Father   . Prostate cancer Father   . Breast cancer Father   . Hypertension Mother   . COPD Mother   . Diabetes Maternal Grandfather   . Colon polyps Other     Social History   Socioeconomic History  . Marital status: Divorced    Spouse name: Not on file  . Number of children: 2  . Years of education: Not on file  . Highest education level: Not on file  Occupational History  . Occupation: independent Marine scientist: [employer]  Tobacco Use  . Smoking status: Current Every Day Smoker    Packs/day: 1.50    Years: 35.00    Pack years: 52.50    Types: Cigarettes  . Smokeless tobacco: Never Used  . Tobacco comment: 1 ppd, patient has been counseled to quit smoking 08/13/08  Substance and Sexual Activity  . Alcohol use: Yes    Alcohol/week: 0.0 standard drinks    Comment: social-beer 1 nightly  . Drug use: No  . Sexual activity: Yes  Other Topics Concern  . Not on file  Social History Narrative  . Not on file   Social Determinants of Health   Financial Resource Strain: Not on file  Food Insecurity: Not on file  Transportation Needs: Not on file  Physical Activity: Not on file  Stress: Not on file  Social Connections: Not on file  Intimate Partner Violence: Not on file    Past Medical History, Surgical history, Social history, and Family history were reviewed and updated as appropriate.   Please see review of systems for further details on the patient's review from today.   Objective:   Physical Exam:  LMP 06/18/2014   Physical Exam Neurological:     Mental Status: She is alert and oriented to person, place, and time.     Cranial Nerves: No dysarthria.  Psychiatric:        Attention and Perception: Attention normal.        Mood and Affect: Mood is anxious and depressed.        Speech: Speech normal. Speech is not rapid and pressured.        Behavior:  Behavior is cooperative.        Thought Content: Thought content normal. Thought content is not paranoid or delusional. Thought content does not include homicidal or suicidal ideation. Thought content does not include homicidal or suicidal plan.        Cognition and Memory: Cognition and memory normal.        Judgment: Judgment normal.     Lab Review:     Component Value Date/Time   NA 142 10/21/2014 1016   K 4.5 10/21/2014 1016   CL 104 10/21/2014 1016   CO2 29 10/21/2014 1016   GLUCOSE 116 (H) 10/21/2014 1016   BUN 11 10/21/2014 1016   CREATININE 0.83 10/21/2014 1016   CALCIUM 9.8 10/21/2014 1016   PROT 6.6 10/21/2014 1016   ALBUMIN 4.2 10/21/2014 1016   AST 19 10/21/2014 1016   ALT 18 10/21/2014 1016   ALKPHOS 84 10/21/2014 1016   BILITOT 0.4 10/21/2014 1016   GFRNONAA >60 07/19/2014 1504   GFRAA >60 07/19/2014 1504       Component Value Date/Time   WBC 6.0 10/21/2014 1016   RBC 4.41 10/21/2014 1016   HGB 13.6 10/21/2014 1016   HCT 39.3 10/21/2014 1016   PLT 261 10/21/2014 1016   MCV 89.1 10/21/2014 1016   MCH 30.8 10/21/2014 1016   MCHC 34.6 10/21/2014 1016   RDW 14.7 10/21/2014 1016   LYMPHSABS 2.2 10/21/2014 1016   MONOABS 0.5 10/21/2014 1016   EOSABS 0.1 10/21/2014 1016   BASOSABS 0.0 10/21/2014 1016    No results found for: POCLITH, LITHIUM   No results found for: PHENYTOIN, PHENOBARB, VALPROATE, CBMZ   .res Assessment: Plan:    Major depressive disorder, recurrent episode, moderate (HCC) - Plan: desvenlafaxine (PRISTIQ) 100 MG 24 hr tablet  Generalized anxiety disorder - Plan: propranolol (INDERAL) 20 MG tablet, ALPRAZolam (XANAX) 0.5 MG tablet, desvenlafaxine (PRISTIQ) 100 MG 24 hr tablet  Insomnia due to mental condition   Kassey has had relapses in the past when she tried to stop antidepressants.  .  Discussed SSRI withdrawal because she has had that in the past when she tried to go off of Effexor.  She will contact us for specific instructions  before trying to do so this time. Her symptoms of depression and anxiety have worsened since her last appointment and for several months.  She was encouraged not to let this go on for months if she does not respond.  We discussed a variety of options especially given that she is failed multiple different antidepressants.  She has successfully decaffeinated.  Discussed potential metabolic side effects associated with atypical antipsychotics, as well as potential risk for movement side effects. Advised pt to contact office if movement side effects occur.   Continue propranolol 20 mg AM and 2 PM.  This is helped her anxiety.  She has been able to reduce Xanax by taking the higher dose of propranolol.  She's done OK off the Abilify.  She needs the Xanax PAN as needed for mild panic episodes.  She finds the Activa's generic to be less effective than PAR PHARMA generic alprazolam.  I will order her to receive any generic except Actavis.  Patient has a high deductible policy but it is covering her medication  Follow-up 6 mos  Lynder Parents MD, DFAPA  Please see After Visit Summary for patient specific instructions.  No future appointments.  No orders of the defined types were placed in this encounter.     -------------------------------

## 2020-03-27 ENCOUNTER — Other Ambulatory Visit: Payer: Self-pay | Admitting: Psychiatry

## 2020-03-27 DIAGNOSIS — F411 Generalized anxiety disorder: Secondary | ICD-10-CM

## 2020-03-27 DIAGNOSIS — F331 Major depressive disorder, recurrent, moderate: Secondary | ICD-10-CM

## 2020-04-08 ENCOUNTER — Telehealth: Payer: Self-pay

## 2020-04-08 NOTE — Telephone Encounter (Signed)
Prior Authorization submitted for DESVENLAFAXINE ER 100 MG 2 TABLETS DAILY, with [facility]   This request was denied because it did not meet the following requirements: Based on the information provided, does not meet the established medication-specific criteria or guidelines for Desvenlafaxine Er at this time. Per patient's health plan's criteria, more than 1 tablet per day is covered if you meet the following: (1) One of the following: (A) The higher dose or quantity is supported in the drug labeling (dosage and administration section of the manufacturer's prescribing information). (B) The higher dose or quantity is supported by one of the following medical resources: (I) [employer] [facility]. (II) [place] by [employer].   Will contact Dr. Clovis Pu with denial and how to proceed

## 2020-04-13 NOTE — Telephone Encounter (Signed)
To this end up getting approved after sending in the article?

## 2020-04-13 NOTE — Telephone Encounter (Signed)
I will follow up but I have not received anything back yet.

## 2020-05-06 NOTE — Telephone Encounter (Signed)
Contacted pt's pharmacy and they report she used [facility] and paid $39.00 for #60. The pharmacist ran it again through insurance and still denied. Contacted [employer] [place] about the article that was faxed per their request justifying the dose. After being transferred to appeals dept they cancelled the request sent on 04/08/2020 with the article because they report it should have been an appeal and we would need written consent from pt to submit. Explained to  representative according to the response from [place] they just needed an article to justify her dose and it did not mention initiating an Appeal. She asked if we had been notified and I reported no. She suggests pt reach out to insurance to do an appeal process if that's what she would like to do.  Will update Dr. Clovis Pu with information.

## 2020-07-26 ENCOUNTER — Other Ambulatory Visit: Payer: Self-pay | Admitting: Psychiatry

## 2020-07-26 DIAGNOSIS — F411 Generalized anxiety disorder: Secondary | ICD-10-CM

## 2020-07-27 NOTE — Telephone Encounter (Signed)
She's due back now for 6 month f/u

## 2020-10-16 ENCOUNTER — Other Ambulatory Visit: Payer: Self-pay | Admitting: Psychiatry

## 2020-10-16 DIAGNOSIS — F411 Generalized anxiety disorder: Secondary | ICD-10-CM

## 2021-02-10 ENCOUNTER — Other Ambulatory Visit: Payer: Self-pay | Admitting: Psychiatry

## 2021-02-10 DIAGNOSIS — F411 Generalized anxiety disorder: Secondary | ICD-10-CM

## 2021-02-13 NOTE — Telephone Encounter (Signed)
Called patient and LVM. If she is following with CP she needs an appt. Patient has not been seen since 12/21.

## 2021-04-18 ENCOUNTER — Other Ambulatory Visit: Payer: Self-pay | Admitting: Psychiatry

## 2021-04-18 DIAGNOSIS — F331 Major depressive disorder, recurrent, moderate: Secondary | ICD-10-CM

## 2021-04-18 DIAGNOSIS — F411 Generalized anxiety disorder: Secondary | ICD-10-CM

## 2021-04-19 NOTE — Telephone Encounter (Signed)
Call to RS

## 2021-04-20 NOTE — Telephone Encounter (Signed)
Lvm for pt to schedule

## 2021-06-14 ENCOUNTER — Other Ambulatory Visit: Payer: Self-pay | Admitting: Psychiatry

## 2021-06-14 DIAGNOSIS — F411 Generalized anxiety disorder: Secondary | ICD-10-CM

## 2021-06-14 NOTE — Telephone Encounter (Signed)
Lvm for pt to call and schedule appt 

## 2021-06-19 ENCOUNTER — Ambulatory Visit (INDEPENDENT_AMBULATORY_CARE_PROVIDER_SITE_OTHER): Payer: 59 | Admitting: Psychiatry

## 2021-06-19 ENCOUNTER — Encounter: Payer: Self-pay | Admitting: Psychiatry

## 2021-06-19 DIAGNOSIS — F5105 Insomnia due to other mental disorder: Secondary | ICD-10-CM

## 2021-06-19 DIAGNOSIS — F331 Major depressive disorder, recurrent, moderate: Secondary | ICD-10-CM

## 2021-06-19 DIAGNOSIS — F411 Generalized anxiety disorder: Secondary | ICD-10-CM | POA: Diagnosis not present

## 2021-06-19 MED ORDER — DESVENLAFAXINE SUCCINATE ER 100 MG PO TB24: 200.0000 mg | ORAL_TABLET | Freq: Every morning | ORAL | 11 refills | Status: DC

## 2021-06-19 MED ORDER — PROPRANOLOL HCL 20 MG PO TABS: 20.0000 mg | ORAL_TABLET | Freq: Three times a day (TID) | ORAL | 5 refills | Status: DC | PRN

## 2021-06-19 MED ORDER — ALPRAZOLAM 0.5 MG PO TABS: ORAL_TABLET | ORAL | 5 refills | Status: DC

## 2021-06-19 NOTE — Progress Notes (Signed)
Tracey Fields ?[id number] ?1960-12-06 ?61 y.o. ? ?Virtual Visit via Telephone Note ? ?I connected with pt by telephone and verified that I am speaking with the correct person using two identifiers. ?  ?I discussed the limitations, risks, security and privacy concerns of performing an evaluation and management service by telephone and the availability of in person appointments. I also discussed with the patient that there may be a patient responsible charge related to this service. The patient expressed understanding and agreed to proceed. ? ?I discussed the assessment and treatment plan with the patient. The patient was provided an opportunity to ask questions and all were answered. The patient agreed with the plan and demonstrated an understanding of the instructions. ?  ?The patient was advised to call back or seek an in-person evaluation if the symptoms worsen or if the condition fails to improve as anticipated. ? ?I provided 15 minutes of non-face-to-face time during this encounter. . The patient was located at home and the provider was located office. ?Session from 3 30-3 40 5 PM ? ? ?Subjective:  ? ?Patient ID:  Tracey Fields is a 61 y.o. (DOB [birth year]) female. ? ?Chief Complaint:  ?Chief Complaint  ?Patient presents with  ? Follow-up  ? Depression  ? Anxiety  ? ? ?HPI ?Tracey Fields presents for follow-up of depression and anxiety. ? ?visit March 2020.  She was doing well on Pristiq 200 mg and we did not change medications.  When she has been forced to alternatives she has relapsed every time.  She was also noted needing alprazolam. ? New generic is less effective Actavis/Teva.   Normally takes a half at night and another half once or twice in the day bc gets shaky with rapid pulse.   Other manufacturer PAR PHAMA works better.  Also cannot break the Actavis and needs to do so.  ? ?seen April 21, 2019.  The following was noted: ?Needs Xanax TID and sometimes more.   ?3 cups coffee in the morning. ?Worse with  anxiety about 3 pm.  No longer seems to help.  Doubled up on propranolol and some problems and it helps in the morning. ?Dizziness started yesterday and lasted 2 days.  Didn't miss Pristiq.     ?More depressed.  No energy.  No motivation.  For 4 mos.    Moved to [facility]  Moved to [place] [place].  Happy to be there.  Under less stress than she used to be.   Patient reports stable mood and denies depressed or irritable moods.  Patient denies any recent difficulty with anxiety.  Patient denies difficulty with sleep initiation or maintenance. Denies appetite disturbance.  Patient reports that energy and motivation have been good.  Patient denies any difficulty with concentration.  Patient denies any suicidal ideation. ?Plan: Her symptoms of depression and anxiety have worsened since her last appointment and for several months.  She was encouraged not to let this go on for months if she does not respond.   ?Reduce caffeine. Disc HA.  ?Abilify 5 mg daily.  ?Also increase propranolol 20 mg every morning and every 2 p.m. for anxiety. ?Use Xanax and the round ones are less effective than what she had before.  ? ?05/22/2019, patient called reporting insomnia and asking for Ambien. ? ?06/23/2019 appointment, the following is noted: ?It did help significantly. Always some depression and death thoughts.  Tired all the time.  Low energy chronically without change on Abilify.  Only thing that helps energy is a beer.  No SE except insomnia.  Cannot fall asleep.  Avg 5 hours of sleep and needs 9 hours.   ?Mother took propranolol 20 mg BID helps anxiety and able to take less Xanax. ?3 cups decaf AM.   ?Plan: Prescribed Ambien 5 mg nightly as needed insomnia. ?Also trial of increasing Abilify to 10 mg daily to see if it will resolve the insomnia and potentially help her depression further.  As noted she is failed multiple other antidepressants. ? ?07/21/2019 phone call patient complaining of both fatigue and restlessness on Abilify 10 mg and  was told to hold it for 3 days and then restarted 5 mg. ? ?01/26/2020 appointment with the following noted: ?Quit Abilify awhile back.  She thinks bc SE. ?Always going to be depressed now that mom is gone.  I think it's normal.  Working on enjoyment and making herself go out.  Has friends pushing her to get out and whe works and that helpes ?No Ambien.  Sleep is fine now.  Don't need Ambien now that off Xanax. ?Takes Xanax prn anxiety and regularly at night.  Average 1-2 daily depending on the day. ?No problems with the meds.  Doesn't want med changes or more meds.  Marland Kitchen ?Getting Pristiq throught [facility]. ?Asked about taking propranolol to shaking. ? ?06/19/21 appt noted: ?Stayed on Pristiq and satisfied with it.  Occ down days.   ?Xanax 0.5 mg prn shakes and for sleep.  ? ?Past Psychiatric Medication Trials: Pristiq 200, Xanax 0.5 mg 3 times daily as needed, propranolol 10 mg, temazepam amnesia, trazodone no response, mirtazapine, clonazepam, buspirone,  ?citalopram 40 no response, Wellbutrin, fluoxetine, lithium, venlafaxine, Pristiq ?Ambien ? ?Review of Systems:  ?Review of Systems  ?Respiratory:  Positive for cough.   ?Neurological:  Negative for tremors.  ? ?Medications: I have reviewed the patient's current medications.  ? ?Current Outpatient Medications  ?Medication Sig Dispense Refill  ? aspirin EC 81 MG tablet Take 81 mg by mouth at bedtime.    ? Aspirin-Salicylamide-Caffeine (BC HEADACHE POWDER PO) Take 1 each by mouth daily as needed (pain.). Reported on 04/21/2015    ? cetirizine (ZYRTEC) 10 MG tablet Take 5 mg by mouth at bedtime.     ? ibuprofen (ADVIL,MOTRIN) 200 MG tablet Take 400 mg by mouth every 6 (six) hours as needed for headache or moderate pain. Reported on 04/21/2015    ? lipase/protease/amylase (CREON) 36000 UNITS CPEP capsule TAKE ONE CAPSULE BY MOUTH 3 TIMES A DAY WITH MEALS 100 capsule 1  ? omeprazole (PRILOSEC) 40 MG capsule     ? triamcinolone cream (KENALOG) 0.1 %     ? ALPRAZolam (XANAX) 0.5  MG tablet TAKE ONE TABLET BY MOUTH FOUR TIMES A DAY AS NEEDED FOR ANXIETY 120 tablet 5  ? desvenlafaxine (PRISTIQ) 100 MG 24 hr tablet Take 2 tablets (200 mg total) by mouth every morning. 60 tablet 11  ? propranolol (INDERAL) 20 MG tablet Take 1 tablet (20 mg total) by mouth 3 (three) times daily as needed (tremor). 90 tablet 5  ? ?No current facility-administered medications for this visit.  ? ? ?Medication Side Effects: None ? ?Allergies:  ?Allergies  ?Allergen Reactions  ? Anesthetics, Amide Other (See Comments)  ?  System shut down when 61 years old having jaw work done  ? Succinylcholine Chloride Other (See Comments)  ?  Major organs stopped working  ? ? ?Past Medical History:  ?Diagnosis Date  ? Abdominal pain, left lower quadrant 02/24/2010  ? pain resolved.  ?  ANXIETY 09/21/2006  ? Arthritis   ? thumbs   ? BACK PAIN 01/12/2009  ? Cancer [facility])   ? hx of skin cancers on face   ? Chronic pancreatitis ([place]) 07/29/2012  ? Treatment Creon orally  ? COPD (chronic obstructive pulmonary disease) ([place])   ? COUGH, CHRONIC 09/21/2006  ? remains occ. with productive white mucus 07-19-14  ? DEPRESSION 12/29/2007  ? DIVERTICULOSIS, COLON 12/29/2007  ? DYSPNEA 06/02/2008  ? Family history of anesthesia complication   ? ? malignant hyerthermia- grandfather   ? FATIGUE 07/14/2008  ? HEMORRHOIDS, HX OF 09/21/2006  ? 07-19-14 no problems now.  ? HOARSENESS, CHRONIC 08/13/2008  ? remains "raspy deep voice"  ? HYPERTENSION 12/29/2007  ? denies" takes Propranolol for tremors and heart rhythm  ? Impaired glucose tolerance 10/19/2010  ? Irritable bowel syndrome 09/21/2006  ? Left leg numbness   ? resolved-not a problem 07-19-14.  ? Malignant hyperthermia   ? Mitral valve prolapse   ? Pain   ? more abdominal "midline to RLQ" on 07-19-14  ? PREMATURE VENTRICULAR CONTRACTIONS 09/21/2006  ? 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 SORE THROAT 07/14/2008  ? 07-19-14 no problem today.  ? Wheezing 05/18/2009  ? remains as of 07-19-14  ? ? ?Family History   ?Problem Relation Age of Onset  ? Lung cancer Father   ? Prostate cancer Father   ? Breast cancer Father   ? Hypertension Mother   ? COPD Mother   ? Diabetes Maternal Grandfather   ? Colon polyps Other

## 2021-12-13 ENCOUNTER — Other Ambulatory Visit: Payer: Self-pay | Admitting: Psychiatry

## 2021-12-13 DIAGNOSIS — F411 Generalized anxiety disorder: Secondary | ICD-10-CM

## 2022-01-05 ENCOUNTER — Other Ambulatory Visit: Payer: Self-pay | Admitting: Psychiatry

## 2022-01-05 DIAGNOSIS — F411 Generalized anxiety disorder: Secondary | ICD-10-CM

## 2022-06-11 ENCOUNTER — Other Ambulatory Visit: Payer: Self-pay | Admitting: Psychiatry

## 2022-06-11 DIAGNOSIS — F331 Major depressive disorder, recurrent, moderate: Secondary | ICD-10-CM

## 2022-06-11 DIAGNOSIS — F411 Generalized anxiety disorder: Secondary | ICD-10-CM

## 2022-06-11 NOTE — Telephone Encounter (Signed)
Please call to schedule an appt. Yearly F/U and due this month.

## 2022-06-14 NOTE — Telephone Encounter (Signed)
Lvm for pt to schedule

## 2022-07-05 ENCOUNTER — Other Ambulatory Visit: Payer: Self-pay | Admitting: Psychiatry

## 2022-07-05 DIAGNOSIS — F411 Generalized anxiety disorder: Secondary | ICD-10-CM

## 2022-07-06 ENCOUNTER — Other Ambulatory Visit: Payer: Self-pay | Admitting: Psychiatry

## 2022-07-06 DIAGNOSIS — F331 Major depressive disorder, recurrent, moderate: Secondary | ICD-10-CM

## 2022-07-06 DIAGNOSIS — F411 Generalized anxiety disorder: Secondary | ICD-10-CM

## 2022-07-10 ENCOUNTER — Other Ambulatory Visit: Payer: Self-pay | Admitting: Psychiatry

## 2022-07-10 DIAGNOSIS — F411 Generalized anxiety disorder: Secondary | ICD-10-CM

## 2022-07-31 ENCOUNTER — Ambulatory Visit: Payer: BLUE CROSS/BLUE SHIELD | Admitting: Psychiatry

## 2022-07-31 ENCOUNTER — Encounter: Payer: Self-pay | Admitting: Psychiatry

## 2022-07-31 DIAGNOSIS — F411 Generalized anxiety disorder: Secondary | ICD-10-CM

## 2022-07-31 DIAGNOSIS — G250 Essential tremor: Secondary | ICD-10-CM | POA: Diagnosis not present

## 2022-07-31 DIAGNOSIS — F331 Major depressive disorder, recurrent, moderate: Secondary | ICD-10-CM | POA: Diagnosis not present

## 2022-07-31 MED ORDER — DESVENLAFAXINE SUCCINATE ER 100 MG PO TB24: 100.0000 mg | ORAL_TABLET | Freq: Every morning | ORAL | 3 refills | Status: DC

## 2022-07-31 MED ORDER — PROPRANOLOL HCL 20 MG PO TABS: 20.0000 mg | ORAL_TABLET | Freq: Three times a day (TID) | ORAL | 3 refills | Status: DC | PRN

## 2022-07-31 MED ORDER — DESVENLAFAXINE SUCCINATE ER 100 MG PO TB24: 100.0000 mg | ORAL_TABLET | Freq: Every day | ORAL | 3 refills | Status: DC

## 2022-07-31 MED ORDER — ALPRAZOLAM 0.5 MG PO TABS: ORAL_TABLET | ORAL | 0 refills | Status: DC

## 2022-07-31 NOTE — Progress Notes (Signed)
Tracey Fields [id number] May 05, 1960 62 y.o.  Virtual Visit via Telephone Note  I connected with pt by telephone and verified that I am speaking with the correct person using two identifiers.   I discussed the limitations, risks, security and privacy concerns of performing an evaluation and management service by telephone and the availability of in person appointments. I also discussed with the patient that there may be a patient responsible charge related to this service. The patient expressed understanding and agreed to proceed.  I discussed the assessment and treatment plan with the patient. The patient was provided an opportunity to ask questions and all were answered. The patient agreed with the plan and demonstrated an understanding of the instructions.   The patient was advised to call back or seek an in-person evaluation if the symptoms worsen or if the condition fails to improve as anticipated.  I provided 15 minutes of non-face-to-face time during this encounter. The call started at 345 and ended at 400 . The patient was located at home and the provider was located office.   Subjective:   Patient ID:  Tracey Fields is a 62 y.o. (DOB 14-Dec-1960) female.  Chief Complaint:  Chief Complaint  Patient presents with   Follow-up   Anxiety   Depression    HPI Tracey Fields presents for follow-up of depression and anxiety.  visit March 2020.  She was doing well on Pristiq 200 mg and we did not change medications.  When she has been forced to alternatives she has relapsed every time.  She was also noted needing alprazolam.  New generic is less effective Actavis/Teva.   Normally takes a half at night and another half once or twice in the day bc gets shaky with rapid pulse.   Other manufacturer PAR PHAMA works better.  Also cannot break the Actavis and needs to do so.   seen April 21, 2019.  The following was noted: Needs Xanax TID and sometimes more.   3 cups coffee in the  morning. Worse with anxiety about 3 pm.  No longer seems to help.  Doubled up on propranolol and some problems and it helps in the morning. Dizziness started yesterday and lasted 2 days.  Didn't miss Pristiq.     More depressed.  No energy.  No motivation.  For 4 mos.    Moved to [facility].  Moved to [place].  Happy to be there.  Under less stress than she used to be.   Patient reports stable mood and denies depressed or irritable moods.  Patient denies any recent difficulty with anxiety.  Patient denies difficulty with sleep initiation or maintenance. Denies appetite disturbance.  Patient reports that energy and motivation have been good.  Patient denies any difficulty with concentration.  Patient denies any suicidal ideation. Plan: Her symptoms of depression and anxiety have worsened since her last appointment and for several months.  She was encouraged not to let this go on for months if she does not respond.   Reduce caffeine. Disc HA.  Abilify 5 mg daily.  Also increase propranolol 20 mg every morning and every 2 p.m. for anxiety. Use Xanax and the round ones are less effective than what she had before.   05/22/2019, patient called reporting insomnia and asking for Ambien.  06/23/2019 appointment, the following is noted: It did help significantly. Always some depression and death thoughts.  Tired all the time.  Low energy chronically without change on Abilify.  Only thing that helps energy is a  beer.   No SE except insomnia.  Cannot fall asleep.  Avg 5 hours of sleep and needs 9 hours.   Mother took propranolol 20 mg BID helps anxiety and able to take less Xanax. 3 cups decaf AM.   Plan: Prescribed Ambien 5 mg nightly as needed insomnia. Also trial of increasing Abilify to 10 mg daily to see if it will resolve the insomnia and potentially help her depression further.  As noted she is failed multiple other antidepressants.  07/21/2019 phone call patient complaining of both fatigue and restlessness  on Abilify 10 mg and was told to hold it for 3 days and then restarted 5 mg.  01/26/2020 appointment with the following noted: Quit Abilify awhile back.  She thinks bc SE. Always going to be depressed now that mom is gone.  I think it's normal.  Working on enjoyment and making herself go out.  Has friends pushing her to get out and whe works and that helpes No Ambien.  Sleep is fine now.  Don't need Ambien now that off Xanax. Takes Xanax prn anxiety and regularly at night.  Average 1-2 daily depending on the day. No problems with the meds.  Doesn't want med changes or more meds.  . Getting Pristiq throught [facility]. Asked about taking propranolol to shaking.  06/19/21 appt noted: Stayed on Pristiq and satisfied with it.  Occ down days.   Xanax 0.5 mg prn shakes and for sleep.   07/30/52 appt noted: Doing ok.  But might move closer to kids [place].  Pristiq 200 mg daily.  Propranolol 20 prn tremor.  Xanax 0.5 mg usually 1 prn daily, more lately with flutter. No sign px with dep.  Insurance won't cover 200 mg Pristiq daily.  Disc prior relapse with less.  Heart fluttering and needs to go to doc for it. Dealing with hernia from surgery troublesome Still dealing with loss of mother but not unusual  Anxiety and tremor manageable with meds.  No SE   Past Psychiatric Medication Trials:  Pristiq 200, citalopram 40 no response, Wellbutrin, fluoxetine, lithium, venlafaxine Xanax 0.5 mg 3 times daily as needed, propranolol 10 mg, temazepam amnesia, trazodone no response, mirtazapine, clonazepam, buspirone,   Ambien  Review of Systems:  Review of Systems  Respiratory:  Positive for cough.   Cardiovascular:  Positive for palpitations.  Neurological:  Negative for tremors.    Medications: I have reviewed the patient's current medications.   Current Outpatient Medications  Medication Sig Dispense Refill   ALPRAZolam (XANAX) 0.5 MG tablet TAKE 1 TABLET BY MOUTH 4 TIMES A DAY AS NEEDED FOR  ANXIETY 120 tablet 0   aspirin EC 81 MG tablet Take 81 mg by mouth at bedtime.     Aspirin-Salicylamide-Caffeine (BC HEADACHE POWDER PO) Take 1 each by mouth daily as needed (pain.). Reported on 04/21/2015     cetirizine (ZYRTEC) 10 MG tablet Take 5 mg by mouth at bedtime.      desvenlafaxine (PRISTIQ) 100 MG 24 hr tablet Take 1 tablet (100 mg total) by mouth every morning. 90 tablet 3   desvenlafaxine (PRISTIQ) 100 MG 24 hr tablet Take 1 tablet (100 mg total) by mouth daily. 90 tablet 3   ibuprofen (ADVIL,MOTRIN) 200 MG tablet Take 400 mg by mouth every 6 (six) hours as needed for headache or moderate pain. Reported on 04/21/2015     lipase/protease/amylase (CREON) 36000 UNITS CPEP capsule TAKE ONE CAPSULE BY MOUTH 3 TIMES A DAY WITH MEALS 100 capsule 1  omeprazole (PRILOSEC) 40 MG capsule      propranolol (INDERAL) 20 MG tablet Take 1 tablet (20 mg total) by mouth 3 (three) times daily as needed (tremor). 270 tablet 3   triamcinolone cream (KENALOG) 0.1 %      No current facility-administered medications for this visit.    Medication Side Effects: None  Allergies:  Allergies  Allergen Reactions   Anesthetics, Amide Other (See Comments)    System shut down when 63 years old having jaw work done   Succinylcholine Chloride Other (See Comments)    Major organs stopped working    Past Medical History:  Diagnosis Date   Abdominal pain, left lower quadrant 02/24/2010   pain resolved.   ANXIETY 09/21/2006   Arthritis    thumbs    BACK PAIN 01/12/2009   Cancer (HCC)    hx of skin cancers on face    Chronic pancreatitis (HCC) 07/29/2012   Treatment Creon orally   COPD (chronic obstructive pulmonary disease) (HCC)    COUGH, CHRONIC 09/21/2006   remains occ. with productive white mucus 07-19-14   DEPRESSION 12/29/2007   DIVERTICULOSIS, COLON 12/29/2007   DYSPNEA 06/02/2008   Family history of anesthesia complication    ? malignant hyerthermia- grandfather    FATIGUE 07/14/2008    HEMORRHOIDS, HX OF 09/21/2006   07-19-14 no problems now.   HOARSENESS, CHRONIC 08/13/2008   remains "raspy deep voice"   HYPERTENSION 12/29/2007   denies" takes Propranolol for tremors and heart rhythm   Impaired glucose tolerance 10/19/2010   Irritable bowel syndrome 09/21/2006   Left leg numbness    resolved-not a problem 07-19-14.   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   Wheezing 05/18/2009   remains as of 07-19-14    Family History  Problem Relation Age of Onset   Lung cancer Father    Prostate cancer Father    Breast cancer Father    Hypertension Mother    COPD Mother    Diabetes Maternal Grandfather    Colon polyps Other     Social History   Socioeconomic History   Marital status: Divorced    Spouse name: Not on file   Number of children: 2   Years of education: Not on file   Highest education level: Not on file  Occupational History   Occupation: independent Biochemist, clinical: [employer]  Tobacco Use   Smoking status: Every Day    Packs/day: 1.50    Years: 35.00    Additional pack years: 0.00    Total pack years: 52.50    Types: Cigarettes   Smokeless tobacco: Never   Tobacco comments:    1 ppd, patient has been counseled to quit smoking 08/13/08  Substance and Sexual Activity   Alcohol use: Yes    Alcohol/week: 0.0 standard drinks of alcohol    Comment: social-beer 1 nightly   Drug use: No   Sexual activity: Yes  Other Topics Concern   Not on file  Social History Narrative   Not on file   Social Determinants of Health   Financial Resource Strain: Not on file  Food Insecurity: Not on file  Transportation Needs: Not on file  Physical Activity: Not on file  Stress: Not on file  Social Connections:  Not on file  Intimate Partner  Violence: Not on file    Past Medical History, Surgical history, Social history, and Family history were reviewed and updated as appropriate.   Please see review of systems for further details on the patient's review from today.   Objective:   Physical Exam:  LMP 06/18/2014   Physical Exam Neurological:     Mental Status: She is alert and oriented to person, place, and time.     Cranial Nerves: No dysarthria.  Psychiatric:        Attention and Perception: Attention normal.        Mood and Affect: Mood is anxious and depressed.        Speech: Speech normal. Speech is not rapid and pressured.        Behavior: Behavior is cooperative.        Thought Content: Thought content normal. Thought content is not paranoid or delusional. Thought content does not include homicidal or suicidal ideation. Thought content does not include suicidal plan.        Cognition and Memory: Cognition and memory normal.        Judgment: Judgment normal.     Lab Review:     Component Value Date/Time   NA 142 10/21/2014 1016   K 4.5 10/21/2014 1016   CL 104 10/21/2014 1016   CO2 29 10/21/2014 1016   GLUCOSE 116 (H) 10/21/2014 1016   BUN 11 10/21/2014 1016   CREATININE 0.83 10/21/2014 1016   CALCIUM 9.8 10/21/2014 1016   PROT 6.6 10/21/2014 1016   ALBUMIN 4.2 10/21/2014 1016   AST 19 10/21/2014 1016   ALT 18 10/21/2014 1016   ALKPHOS 84 10/21/2014 1016   BILITOT 0.4 10/21/2014 1016   GFRNONAA >60 07/19/2014 1504   GFRAA >60 07/19/2014 1504       Component Value Date/Time   WBC 6.0 10/21/2014 1016   RBC 4.41 10/21/2014 1016   HGB 13.6 10/21/2014 1016   HCT 39.3 10/21/2014 1016   PLT 261 10/21/2014 1016   MCV 89.1 10/21/2014 1016   MCH 30.8 10/21/2014 1016   MCHC 34.6 10/21/2014 1016   RDW 14.7 10/21/2014 1016   LYMPHSABS 2.2 10/21/2014 1016   MONOABS 0.5 10/21/2014 1016   EOSABS 0.1 10/21/2014 1016   BASOSABS 0.0 10/21/2014 1016    No results found for: "POCLITH", "LITHIUM"   No  results found for: "PHENYTOIN", "PHENOBARB", "VALPROATE", "CBMZ"   .res Assessment: Plan:    Major depressive disorder, recurrent episode, moderate (HCC) - Plan: desvenlafaxine (PRISTIQ) 100 MG 24 hr tablet, desvenlafaxine (PRISTIQ) 100 MG 24 hr tablet  Generalized anxiety disorder - Plan: desvenlafaxine (PRISTIQ) 100 MG 24 hr tablet, desvenlafaxine (PRISTIQ) 100 MG 24 hr tablet, ALPRAZolam (XANAX) 0.5 MG tablet, propranolol (INDERAL) 20 MG tablet  Benign essential tremor - Plan: propranolol (INDERAL) 20 MG tablet   Tracey Fields has had relapses in the past when she tried to stop antidepressants.  .  Discussed SSRI withdrawal because she has had that in the past when she tried to go off of Effexor.  She will contact us for specific instructions before trying to do so this time.  She does not want to stop bc history relapse. Sx more stable.  Disc px with insurance getting Pristiq and use of [facility] in detail.  Hx relase with less.  She has successfully decaffeinated.  Continue Pristiq 200 mg daily OK Xanax 0.5 mg HS and prn  Continue propranolol 20 mg AM and 2 PM.  This is helped her anxiety.  She has been able to reduce Xanax by taking the higher dose of propranolol.  She needs the Xanax PAN as needed for mild panic episodes.  She finds the Activa's generic to be less  effective than PAR PHARMA generic alprazolam.  I will order her to receive any generic except Actavis. We discussed the short-term risks associated with benzodiazepines including sedation and increased fall risk among others.  Discussed long-term side effect risk including dependence, potential withdrawal symptoms, and the potential eventual dose-related risk of dementia.  But recent studies from 2020 dispute this association between benzodiazepines and dementia risk. Newer studies in 2020 do not support an association with dementia.  Patient has a high deductible policy but it is covering her medication  No Ambien  Follow-up 1  year  Meredith Staggers MD, DFAPA  Please see After Visit Summary for patient specific instructions.  No future appointments.  No orders of the defined types were placed in this encounter.     -------------------------------

## 2022-10-24 ENCOUNTER — Other Ambulatory Visit: Payer: Self-pay | Admitting: Psychiatry

## 2022-10-24 DIAGNOSIS — F411 Generalized anxiety disorder: Secondary | ICD-10-CM

## 2023-04-17 ENCOUNTER — Other Ambulatory Visit: Payer: Self-pay

## 2023-04-17 DIAGNOSIS — F331 Major depressive disorder, recurrent, moderate: Secondary | ICD-10-CM

## 2023-04-17 DIAGNOSIS — F411 Generalized anxiety disorder: Secondary | ICD-10-CM

## 2023-04-17 MED ORDER — DESVENLAFAXINE SUCCINATE ER 100 MG PO TB24: 100.0000 mg | ORAL_TABLET | Freq: Every day | ORAL | 3 refills | Status: DC

## 2023-04-17 MED ORDER — DESVENLAFAXINE SUCCINATE ER 100 MG PO TB24: 100.0000 mg | ORAL_TABLET | Freq: Every morning | ORAL | 3 refills | Status: DC

## 2023-05-15 ENCOUNTER — Other Ambulatory Visit: Payer: Self-pay | Admitting: Psychiatry

## 2023-05-15 DIAGNOSIS — F411 Generalized anxiety disorder: Secondary | ICD-10-CM

## 2023-06-13 ENCOUNTER — Other Ambulatory Visit: Payer: Self-pay | Admitting: Psychiatry

## 2023-06-13 DIAGNOSIS — F411 Generalized anxiety disorder: Secondary | ICD-10-CM

## 2023-06-13 DIAGNOSIS — F331 Major depressive disorder, recurrent, moderate: Secondary | ICD-10-CM

## 2023-08-17 ENCOUNTER — Other Ambulatory Visit: Payer: Self-pay | Admitting: Psychiatry

## 2023-08-17 DIAGNOSIS — F411 Generalized anxiety disorder: Secondary | ICD-10-CM

## 2023-08-17 DIAGNOSIS — F331 Major depressive disorder, recurrent, moderate: Secondary | ICD-10-CM

## 2023-09-07 ENCOUNTER — Encounter: Payer: Self-pay | Admitting: Internal Medicine

## 2023-11-25 ENCOUNTER — Other Ambulatory Visit: Payer: Self-pay | Admitting: Psychiatry

## 2023-11-25 DIAGNOSIS — F411 Generalized anxiety disorder: Secondary | ICD-10-CM

## 2023-11-26 NOTE — Telephone Encounter (Signed)
 Please call to schedule FU. Is seen yearly, but was last seen 07/2022.

## 2023-11-26 NOTE — Telephone Encounter (Signed)
 LM for pt to call to schedule her yearly appt.

## 2024-01-21 ENCOUNTER — Encounter: Payer: Self-pay | Admitting: Psychiatry

## 2024-01-21 ENCOUNTER — Ambulatory Visit: Admitting: Psychiatry

## 2024-01-21 DIAGNOSIS — F331 Major depressive disorder, recurrent, moderate: Secondary | ICD-10-CM | POA: Diagnosis not present

## 2024-01-21 DIAGNOSIS — F411 Generalized anxiety disorder: Secondary | ICD-10-CM | POA: Diagnosis not present

## 2024-01-21 DIAGNOSIS — G250 Essential tremor: Secondary | ICD-10-CM

## 2024-01-21 MED ORDER — PROPRANOLOL HCL 20 MG PO TABS: 20.0000 mg | ORAL_TABLET | Freq: Three times a day (TID) | ORAL | 3 refills | Status: AC | PRN

## 2024-01-21 MED ORDER — ALPRAZOLAM 0.5 MG PO TABS: ORAL_TABLET | ORAL | 5 refills | Status: AC

## 2024-01-21 MED ORDER — DESVENLAFAXINE SUCCINATE ER 100 MG PO TB24: 100.0000 mg | ORAL_TABLET | Freq: Every morning | ORAL | 11 refills | Status: AC

## 2024-01-21 NOTE — Progress Notes (Signed)
 Tracey Fields [id number] 1960-03-31 63 y.o.  Virtual Visit via Telephone Note  I connected with pt by telephone and verified that I am speaking with the correct person using two identifiers.   I discussed the limitations, risks, security and privacy concerns of performing an evaluation and management service by telephone and the availability of in person appointments. I also discussed with the patient that there may be a patient responsible charge related to this service. The patient expressed understanding and agreed to proceed.  I discussed the assessment and treatment plan with the patient. The patient was provided an opportunity to ask questions and all were answered. The patient agreed with the plan and demonstrated an understanding of the instructions.   The patient was advised to call back or seek an in-person evaluation if the symptoms worsen or if the condition fails to improve as anticipated.  I provided 15 minutes of non-face-to-face time during this encounter.  The patient was located at home and the provider was located office. Session 1000-1030  Subjective:   Patient ID:  Tracey Fields is a 63 y.o. (DOB 03/18/1960) female.  Chief Complaint:  Chief Complaint  Patient presents with   Follow-up   Depression   Anxiety    HPI [name] presents for follow-up of depression and anxiety.  visit March 2020.  She was doing well on Pristiq  200 mg and we did not change medications.  When she has been forced to alternatives she has relapsed every time.  She was also noted needing alprazolam .  New generic is less effective Actavis/Teva.   Normally takes a half at night and another half once or twice in the day bc gets shaky with rapid pulse.   Other manufacturer PAR PHAMA works better.  Also cannot break the Actavis and needs to do so.   seen April 21, 2019.  The following was noted: Needs Xanax  TID and sometimes more.   3 cups coffee in the morning. Worse with anxiety about 3  pm.  No longer seems to help.  Doubled up on propranolol  and some problems and it helps in the morning. Dizziness started yesterday and lasted 2 days.  Didn't miss Pristiq .     More depressed.  No energy.  No motivation.  For 4 mos.    Moved to [facility]  Moved to [place].  Happy to be there.  Under less stress than she used to be.   Patient reports stable mood and denies depressed or irritable moods.  Patient denies any recent difficulty with anxiety.  Patient denies difficulty with sleep initiation or maintenance. Denies appetite disturbance.  Patient reports that energy and motivation have been good.  Patient denies any difficulty with concentration.  Patient denies any suicidal ideation. Plan: Her symptoms of depression and anxiety have worsened since her last appointment and for several months.  She was encouraged not to let this go on for months if she does not respond.   Reduce caffeine. Disc HA.  Abilify  5 mg daily.  Also increase propranolol  20 mg every morning and every 2 p.m. for anxiety. Use Xanax  and the round ones are less effective than what she had before.   05/22/2019, patient called reporting insomnia and asking for Ambien .  06/23/2019 appointment, the following is noted: It did help significantly. Always some depression and death thoughts.  Tired all the time.  Low energy chronically without change on Abilify .  Only thing that helps energy is a beer.   No SE except insomnia.  Cannot fall asleep.  Avg 5 hours of sleep and needs 9 hours.   Mother took propranolol  20 mg BID helps anxiety and able to take less Xanax . 3 cups decaf AM.   Plan: Prescribed Ambien  5 mg nightly as needed insomnia. Also trial of increasing Abilify  to 10 mg daily to see if it will resolve the insomnia and potentially help her depression further.  As noted she is failed multiple other antidepressants.  07/21/2019 phone call patient complaining of both fatigue and restlessness on Abilify  10 mg and was told to hold  it for 3 days and then restarted 5 mg.  01/26/2020 appointment with the following noted: Quit Abilify  awhile back.  She thinks bc SE. Always going to be depressed now that mom is gone.  I think it's normal.  Working on enjoyment and making herself go out.  Has friends pushing her to get out and whe works and that helpes No Ambien .  Sleep is fine now.  Don't need Ambien  now that off Xanax . Takes Xanax  prn anxiety and regularly at night.  Average 1-2 daily depending on the day. No problems with the meds.  Doesn't want med changes or more meds.  . Getting Pristiq  throught [facility]. Asked about taking propranolol  to shaking.  06/19/21 appt noted: Stayed on Pristiq  and satisfied with it.  Occ down days.   Xanax  0.5 mg prn shakes and for sleep.   07/30/52 appt noted: Doing ok.  But might move closer to kids [place].  Pristiq  200 mg daily.  Propranolol  20 prn tremor.  Xanax  0.5 mg usually 1 prn daily, more lately with flutter. No sign px with dep.  Insurance won't cover 200 mg Pristiq  daily.  Disc prior relapse with less.  Heart fluttering and needs to go to doc for it. Dealing with hernia from surgery troublesome Still dealing with loss of mother but not unusual  Anxiety and tremor manageable with meds.  No SE  01/21/24 APPT NOTED:  Med: Pristiq  100 mg daily since May DT cost.  Propranolol  20 BID.  Xanax  0.5 mg usually 1 prn daily, more lately with flutter. Cut back to Pristiq  without a problem.  Residual dep.  It's working fine.   Sister and D also have dep.   Still smokes 1-3 cups coffee.  She tries to control it.   Ok overall health but doesn't go to the doctor.  Some ortho issues.   Not good insurance.  Just play it by ear.     Past Psychiatric Medication Trials:  Pristiq  200, citalopram 40 no response,  fluoxetine,  venlafaxine  Wellbutrin, lithium, Xanax  0.5 mg 3 times daily as needed, propranolol  10 mg, temazepam amnesia, trazodone no response, mirtazapine, clonazepam,   buspirone,   Ambien   Review of Systems:  Review of Systems  Respiratory:  Positive for cough.   Cardiovascular:  Positive for palpitations.  Neurological:  Negative for tremors.  Psychiatric/Behavioral:  Positive for dysphoric mood. The patient is nervous/anxious.     Medications: I have reviewed the patient's current medications.   Current Outpatient Medications  Medication Sig Dispense Refill   ALPRAZolam  (XANAX ) 0.5 MG tablet TAKE 1 TABLET BY MOUTH 4 TIMES A DAY AS NEEDED FOR PAIN 120 tablet 5   aspirin  EC 81 MG tablet Take 81 mg by mouth at bedtime.     Aspirin -Salicylamide-Caffeine (BC HEADACHE POWDER PO) Take 1 each by mouth daily as needed (pain.). Reported on 04/21/2015     cetirizine (ZYRTEC) 10 MG tablet Take 5 mg by  mouth at bedtime.      desvenlafaxine  (PRISTIQ ) 100 MG 24 hr tablet TAKE 2 TABLETS BY MOUTH EVERY MORNING (Patient taking differently: Take 100 mg by mouth every morning.) 60 tablet 0   ibuprofen  (ADVIL ,MOTRIN ) 200 MG tablet Take 400 mg by mouth every 6 (six) hours as needed for headache or moderate pain. Reported on 04/21/2015     lipase/protease/amylase (CREON ) 36000 UNITS CPEP capsule TAKE ONE CAPSULE BY MOUTH 3 TIMES A DAY WITH MEALS 100 capsule 1   omeprazole  (PRILOSEC ) 40 MG capsule      propranolol  (INDERAL ) 20 MG tablet Take 1 tablet (20 mg total) by mouth 3 (three) times daily as needed (tremor). 270 tablet 3   triamcinolone cream (KENALOG) 0.1 %      desvenlafaxine  (PRISTIQ ) 100 MG 24 hr tablet Take 1 tablet (100 mg total) by mouth every morning. (Patient not taking: Reported on 01/21/2024) 90 tablet 3   desvenlafaxine  (PRISTIQ ) 100 MG 24 hr tablet Take 1 tablet (100 mg total) by mouth daily. (Patient not taking: Reported on 01/21/2024) 90 tablet 3   No current facility-administered medications for this visit.    Medication Side Effects: None  Allergies:  Allergies  Allergen Reactions   Anesthetics, Amide Other (See Comments)    System shut down  when 63 years old having jaw work done   Succinylcholine Chloride Other (See Comments)    Major organs stopped working    Past Medical History:  Diagnosis Date   Abdominal pain, left lower quadrant 02/24/2010   pain resolved.   ANXIETY 09/21/2006   Arthritis    thumbs    BACK PAIN 01/12/2009   Cancer (HCC)    hx of skin cancers on face    Chronic pancreatitis (HCC) 07/29/2012   Treatment Creon  orally   COPD (chronic obstructive pulmonary disease) (HCC)    COUGH, CHRONIC 09/21/2006   remains occ. with productive white mucus 07-19-14   DEPRESSION 12/29/2007   DIVERTICULOSIS, COLON 12/29/2007   DYSPNEA 06/02/2008   Family history of anesthesia complication    ? malignant hyerthermia- grandfather    FATIGUE 07/14/2008   HEMORRHOIDS, HX OF 09/21/2006   07-19-14 no problems now.   HOARSENESS, CHRONIC 08/13/2008   remains raspy deep voice   HYPERTENSION 12/29/2007   denies takes Propranolol  for tremors and heart rhythm   Impaired glucose tolerance 10/19/2010   Irritable bowel syndrome 09/21/2006   Left leg numbness    resolved-not a problem 07-19-14.   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   Wheezing 05/18/2009   remains as of 07-19-14    Family History  Problem Relation Age of Onset   Lung cancer Father    Prostate cancer Father    Breast cancer Father    Hypertension Mother    COPD Mother    Diabetes Maternal Grandfather    Colon polyps Other     Social History   Socioeconomic History   Marital status: Divorced    Spouse name: Not on file   Number of children: 2   Years of education: Not on file   Highest education level: Not on file  Occupational History   Occupation: independent Biochemist, Clinical: [employer]  Tobacco Use   Smoking status: Every  Day  Current packs/day: 1.50    Average packs/day: 1.5 packs/day for 35.0 years (52.5 ttl pk-yrs)    Types: Cigarettes   Smokeless tobacco: Never   Tobacco comments:    1 ppd, patient has been counseled to quit smoking 08/13/08  Substance and Sexual Activity   Alcohol use: Yes    Alcohol/week: 0.0 standard drinks of alcohol    Comment: social-beer 1 nightly   Drug use: No   Sexual activity: Yes  Other Topics Concern   Not on file  Social History Narrative   Not on file   Social Drivers of Health   Tobacco Use: High Risk (01/21/2024)   Patient History    Smoking Tobacco Use: Every Day    Smokeless Tobacco Use: Never    Passive Exposure: Not on file  Financial Resource Strain: Not on file  Food Insecurity: Not on file  Transportation Needs: Not on file  Physical Activity: Not on file  Stress: Not on file  Social Connections: Not on file  Intimate Partner Violence: Not on file  Depression (EYV7-0): Not on file  Alcohol Screen: Not on file  Housing: Not on file  Utilities: Not on file  Health Literacy: Not on file    Past Medical History, Surgical history, Social history, and Family history were reviewed and updated as appropriate.   Please see review of systems for further details on the patient's review from today.   Objective:   Physical Exam:  LMP 06/18/2014   Physical Exam Neurological:     Mental Status: She is alert and oriented to person, place, and time.     Cranial Nerves: No dysarthria.  Psychiatric:        Attention and Perception: Attention normal.        Mood and Affect: Mood is anxious and depressed.        Speech: Speech normal. Speech is not rapid and pressured.        Behavior: Behavior is cooperative.        Thought Content: Thought content normal. Thought content is not paranoid or delusional. Thought content does not include homicidal or suicidal ideation. Thought content does not include suicidal plan.        Cognition and Memory: Cognition  and memory normal.        Judgment: Judgment normal.     Comments: Residual sx no worse     Lab Review:     Component Value Date/Time   NA 142 10/21/2014 1016   K 4.5 10/21/2014 1016   CL 104 10/21/2014 1016   CO2 29 10/21/2014 1016   GLUCOSE 116 (H) 10/21/2014 1016   BUN 11 10/21/2014 1016   CREATININE 0.83 10/21/2014 1016   CALCIUM 9.8 10/21/2014 1016   PROT 6.6 10/21/2014 1016   ALBUMIN 4.2 10/21/2014 1016   AST 19 10/21/2014 1016   ALT 18 10/21/2014 1016   ALKPHOS 84 10/21/2014 1016   BILITOT 0.4 10/21/2014 1016   GFRNONAA >60 07/19/2014 1504   GFRAA >60 07/19/2014 1504       Component Value Date/Time   WBC 6.0 10/21/2014 1016   RBC 4.41 10/21/2014 1016   HGB 13.6 10/21/2014 1016   HCT 39.3 10/21/2014 1016   PLT 261 10/21/2014 1016   MCV 89.1 10/21/2014 1016   MCH 30.8 10/21/2014 1016   MCHC 34.6 10/21/2014 1016   RDW 14.7 10/21/2014 1016   LYMPHSABS 2.2 10/21/2014 1016   MONOABS 0.5 10/21/2014 1016   EOSABS 0.1 10/21/2014 1016  BASOSABS 0.0 10/21/2014 1016    No results found for: POCLITH, LITHIUM   No results found for: PHENYTOIN, PHENOBARB, VALPROATE, CBMZ   .res Assessment: Plan:    Generalized anxiety disorder  Major depressive disorder, recurrent episode, moderate (HCC)  Benign essential tremor   Raelyn has had relapses in the past when she tried to stop antidepressants.  .  Discussed SSRI withdrawal because she has had that in the past when she tried to go off of Effexor .  She will contact us  for specific instructions before trying to do so this time.  She does not want to stop bc history relapse. Sx more stable.  Disc px with insurance getting Pristiq  and use of [facility] in detail.  Hx relase with less.  But has done ok with Pristiq  100 since May 2025 DT cost.  Doesn't want change  No energy but doesn't want   She has successfully decaffeinated.  Continue Pristiq  200 mg daily OK Xanax  0.5 mg HS and prn  Rec increase  propranolol  20 mg TID.  This is helped her anxiety.  She has been able to reduce Xanax  by taking the higher dose of propranolol .  Rec have tachycardia checked out by MD.  She says Xanax  or propranolol  handles it but we don't know the cause.   She needs the Xanax  PAN as needed for mild panic episodes.  She finds the Activa's generic to be less  effective than PAR PHARMA generic alprazolam .  I will order her to receive any generic except Actavis. We discussed the short-term risks associated with benzodiazepines including sedation and increased fall risk among others.  Discussed long-term side effect risk including dependence, potential withdrawal symptoms, and the potential eventual dose-related risk of dementia.  But recent studies from 2020 dispute this association between benzodiazepines and dementia risk. Newer studies in 2020 do not support an association with dementia.  Patient has a high deductible policy but it is covering her medication fairly.  Consider Auvelity or pramipexole for residual dep.  No Ambien   Follow-up 1 year  Lorene Macintosh MD, DFAPA  Please see After Visit Summary for patient specific instructions.  No future appointments.  No orders of the defined types were placed in this encounter.     -------------------------------
